# Patient Record
Sex: Female | Born: 1950
Health system: Southern US, Community
[De-identification: ages and names within clinical notes are randomized; demographics above are authoritative.]

## PROBLEM LIST (undated history)

## (undated) DIAGNOSIS — T7840XA Allergy, unspecified, initial encounter: Secondary | ICD-10-CM

## (undated) DIAGNOSIS — M1990 Unspecified osteoarthritis, unspecified site: Secondary | ICD-10-CM

## (undated) DIAGNOSIS — Z9889 Other specified postprocedural states: Secondary | ICD-10-CM

## (undated) DIAGNOSIS — K219 Gastro-esophageal reflux disease without esophagitis: Secondary | ICD-10-CM

## (undated) DIAGNOSIS — F419 Anxiety disorder, unspecified: Secondary | ICD-10-CM

## (undated) DIAGNOSIS — I10 Essential (primary) hypertension: Secondary | ICD-10-CM

## (undated) DIAGNOSIS — E785 Hyperlipidemia, unspecified: Secondary | ICD-10-CM

## (undated) DIAGNOSIS — Z136 Encounter for screening for cardiovascular disorders: Secondary | ICD-10-CM

## (undated) DIAGNOSIS — N951 Menopausal and female climacteric states: Secondary | ICD-10-CM

## (undated) DIAGNOSIS — G4700 Insomnia, unspecified: Secondary | ICD-10-CM

## (undated) DIAGNOSIS — F32A Depression, unspecified: Secondary | ICD-10-CM

## (undated) DIAGNOSIS — R112 Nausea with vomiting, unspecified: Secondary | ICD-10-CM

## (undated) DIAGNOSIS — F329 Major depressive disorder, single episode, unspecified: Secondary | ICD-10-CM

## (undated) HISTORY — DX: Essential (primary) hypertension: I10

## (undated) HISTORY — DX: Menopausal and female climacteric states: N95.1

## (undated) HISTORY — DX: Unspecified osteoarthritis, unspecified site: M19.90

## (undated) HISTORY — DX: Gastro-esophageal reflux disease without esophagitis: K21.9

## (undated) HISTORY — PX: DILATION AND CURETTAGE OF UTERUS: SHX78

## (undated) HISTORY — DX: Hyperlipidemia, unspecified: E78.5

## (undated) HISTORY — PX: FRACTURE SURGERY: SHX138

## (undated) HISTORY — DX: Allergy, unspecified, initial encounter: T78.40XA

## (undated) HISTORY — DX: Insomnia, unspecified: G47.00

## (undated) HISTORY — DX: Depression, unspecified: F32.A

## (undated) HISTORY — DX: Anxiety disorder, unspecified: F41.9

## (undated) HISTORY — DX: Encounter for screening for cardiovascular disorders: Z13.6

## (undated) HISTORY — DX: Other specified postprocedural states: Z98.890

## (undated) HISTORY — PX: TIBIA FRACTURE SURGERY: SHX806

## (undated) HISTORY — DX: Other specified postprocedural states: R11.2

## (undated) HISTORY — PX: BREAST BIOPSY: SHX20

## (undated) HISTORY — DX: Major depressive disorder, single episode, unspecified: F32.9

---

## 2002-05-07 ENCOUNTER — Other Ambulatory Visit: Admission: RE | Admit: 2002-05-07 | Discharge: 2002-05-07 | Payer: Self-pay | Admitting: Obstetrics and Gynecology

## 2003-05-05 ENCOUNTER — Encounter: Payer: Self-pay | Admitting: Internal Medicine

## 2003-05-05 ENCOUNTER — Encounter: Admission: RE | Admit: 2003-05-05 | Discharge: 2003-05-05 | Payer: Self-pay | Admitting: Internal Medicine

## 2003-05-19 ENCOUNTER — Other Ambulatory Visit: Admission: RE | Admit: 2003-05-19 | Discharge: 2003-05-19 | Payer: Self-pay | Admitting: Obstetrics and Gynecology

## 2004-05-31 ENCOUNTER — Other Ambulatory Visit: Admission: RE | Admit: 2004-05-31 | Discharge: 2004-05-31 | Payer: Self-pay | Admitting: Obstetrics and Gynecology

## 2004-11-02 ENCOUNTER — Ambulatory Visit: Payer: Self-pay | Admitting: Internal Medicine

## 2005-05-07 ENCOUNTER — Ambulatory Visit (HOSPITAL_COMMUNITY): Admission: RE | Admit: 2005-05-07 | Discharge: 2005-05-07 | Payer: Self-pay | Admitting: Obstetrics and Gynecology

## 2005-05-07 ENCOUNTER — Encounter (INDEPENDENT_AMBULATORY_CARE_PROVIDER_SITE_OTHER): Payer: Self-pay | Admitting: *Deleted

## 2005-06-06 ENCOUNTER — Other Ambulatory Visit: Admission: RE | Admit: 2005-06-06 | Discharge: 2005-06-06 | Payer: Self-pay | Admitting: Obstetrics and Gynecology

## 2005-11-29 ENCOUNTER — Ambulatory Visit: Payer: Self-pay | Admitting: Internal Medicine

## 2005-12-06 ENCOUNTER — Ambulatory Visit: Payer: Self-pay | Admitting: Internal Medicine

## 2006-08-05 ENCOUNTER — Other Ambulatory Visit: Admission: RE | Admit: 2006-08-05 | Discharge: 2006-08-05 | Payer: Self-pay | Admitting: Obstetrics and Gynecology

## 2007-09-17 ENCOUNTER — Other Ambulatory Visit: Admission: RE | Admit: 2007-09-17 | Discharge: 2007-09-17 | Payer: Self-pay | Admitting: Obstetrics and Gynecology

## 2007-11-19 ENCOUNTER — Ambulatory Visit: Payer: Self-pay | Admitting: Internal Medicine

## 2007-11-19 LAB — CONVERTED CEMR LAB
AST: 27 units/L (ref 0–37)
Albumin: 3.6 g/dL (ref 3.5–5.2)
Basophils Absolute: 0 10*3/uL (ref 0.0–0.1)
Bilirubin, Direct: 0.2 mg/dL (ref 0.0–0.3)
Chloride: 103 meq/L (ref 96–112)
Direct LDL: 154.2 mg/dL
Eosinophils Absolute: 0 10*3/uL (ref 0.0–0.6)
Eosinophils Relative: 0.8 % (ref 0.0–5.0)
GFR calc Af Amer: 95 mL/min
GFR calc non Af Amer: 78 mL/min
Glucose, Bld: 88 mg/dL (ref 70–99)
HCT: 36.1 % (ref 36.0–46.0)
Lymphocytes Relative: 32.6 % (ref 12.0–46.0)
MCHC: 33.9 g/dL (ref 30.0–36.0)
MCV: 92.5 fL (ref 78.0–100.0)
Monocytes Absolute: 0.3 10*3/uL (ref 0.2–0.7)
Neutro Abs: 2.9 10*3/uL (ref 1.4–7.7)
Neutrophils Relative %: 59.2 % (ref 43.0–77.0)
Potassium: 4.1 meq/L (ref 3.5–5.1)
RBC: 3.9 M/uL (ref 3.87–5.11)
Sodium: 140 meq/L (ref 135–145)
TSH: 1.59 microintl units/mL (ref 0.35–5.50)
Triglycerides: 165 mg/dL — ABNORMAL HIGH (ref 0–149)
WBC: 4.8 10*3/uL (ref 4.5–10.5)

## 2007-11-21 DIAGNOSIS — F339 Major depressive disorder, recurrent, unspecified: Secondary | ICD-10-CM | POA: Insufficient documentation

## 2007-11-21 DIAGNOSIS — F329 Major depressive disorder, single episode, unspecified: Secondary | ICD-10-CM

## 2007-11-21 DIAGNOSIS — F331 Major depressive disorder, recurrent, moderate: Secondary | ICD-10-CM | POA: Insufficient documentation

## 2007-12-01 ENCOUNTER — Ambulatory Visit: Payer: Self-pay | Admitting: Internal Medicine

## 2008-10-12 ENCOUNTER — Ambulatory Visit: Payer: Self-pay | Admitting: Internal Medicine

## 2008-10-19 ENCOUNTER — Other Ambulatory Visit: Admission: RE | Admit: 2008-10-19 | Discharge: 2008-10-19 | Payer: Self-pay | Admitting: Obstetrics & Gynecology

## 2008-10-27 ENCOUNTER — Encounter: Admission: RE | Admit: 2008-10-27 | Discharge: 2008-10-27 | Payer: Self-pay | Admitting: Sports Medicine

## 2008-11-26 ENCOUNTER — Telehealth: Payer: Self-pay | Admitting: *Deleted

## 2008-12-03 ENCOUNTER — Telehealth: Payer: Self-pay | Admitting: Internal Medicine

## 2008-12-13 ENCOUNTER — Ambulatory Visit: Payer: Self-pay | Admitting: Family Medicine

## 2008-12-13 ENCOUNTER — Telehealth: Payer: Self-pay | Admitting: Internal Medicine

## 2008-12-13 DIAGNOSIS — J309 Allergic rhinitis, unspecified: Secondary | ICD-10-CM | POA: Insufficient documentation

## 2009-05-18 ENCOUNTER — Encounter: Payer: Self-pay | Admitting: Internal Medicine

## 2009-09-26 ENCOUNTER — Ambulatory Visit: Payer: Self-pay | Admitting: Internal Medicine

## 2009-10-05 ENCOUNTER — Telehealth: Payer: Self-pay | Admitting: Internal Medicine

## 2009-11-09 ENCOUNTER — Ambulatory Visit: Payer: Self-pay | Admitting: Family Medicine

## 2009-11-14 LAB — CONVERTED CEMR LAB
ALT: 21 units/L (ref 0–35)
Albumin: 4.4 g/dL (ref 3.5–5.2)
Alkaline Phosphatase: 49 units/L (ref 39–117)
Basophils Relative: 1 % (ref 0–1)
CO2: 26 meq/L (ref 19–32)
Hemoglobin: 12.6 g/dL (ref 12.0–15.0)
LDL Cholesterol: 203 mg/dL — ABNORMAL HIGH (ref 0–99)
Lymphocytes Relative: 34 % (ref 12–46)
MCHC: 33.9 g/dL (ref 30.0–36.0)
Monocytes Absolute: 0.3 10*3/uL (ref 0.1–1.0)
Monocytes Relative: 6 % (ref 3–12)
Neutro Abs: 2.4 10*3/uL (ref 1.7–7.7)
Neutrophils Relative %: 58 % (ref 43–77)
Potassium: 4.2 meq/L (ref 3.5–5.3)
RBC: 4.14 M/uL (ref 3.87–5.11)
Sodium: 144 meq/L (ref 135–145)
TSH: 1.292 microintl units/mL (ref 0.350–4.500)
Total Bilirubin: 0.4 mg/dL (ref 0.3–1.2)
Total Protein: 7.2 g/dL (ref 6.0–8.3)
VLDL: 25 mg/dL (ref 0–40)
WBC: 4.1 10*3/uL (ref 4.0–10.5)

## 2009-12-15 ENCOUNTER — Other Ambulatory Visit: Admission: RE | Admit: 2009-12-15 | Discharge: 2009-12-15 | Payer: Self-pay | Admitting: Family Medicine

## 2009-12-15 ENCOUNTER — Ambulatory Visit: Payer: Self-pay | Admitting: Family Medicine

## 2009-12-15 DIAGNOSIS — Z780 Asymptomatic menopausal state: Secondary | ICD-10-CM | POA: Insufficient documentation

## 2009-12-15 DIAGNOSIS — E7800 Pure hypercholesterolemia, unspecified: Secondary | ICD-10-CM

## 2009-12-19 ENCOUNTER — Telehealth: Payer: Self-pay | Admitting: Family Medicine

## 2010-01-06 ENCOUNTER — Telehealth: Payer: Self-pay | Admitting: Family Medicine

## 2010-03-13 ENCOUNTER — Telehealth: Payer: Self-pay | Admitting: Family Medicine

## 2010-03-14 ENCOUNTER — Encounter: Payer: Self-pay | Admitting: Family Medicine

## 2010-03-14 LAB — CONVERTED CEMR LAB
ALT: 16 units/L (ref 0–35)
Cholesterol: 228 mg/dL — ABNORMAL HIGH (ref 0–200)
Triglycerides: 132 mg/dL (ref <150)

## 2010-03-16 ENCOUNTER — Ambulatory Visit: Payer: Self-pay | Admitting: Family Medicine

## 2010-04-10 ENCOUNTER — Telehealth: Payer: Self-pay | Admitting: Family Medicine

## 2010-05-02 ENCOUNTER — Encounter: Payer: Self-pay | Admitting: Family Medicine

## 2010-05-02 DIAGNOSIS — R928 Other abnormal and inconclusive findings on diagnostic imaging of breast: Secondary | ICD-10-CM | POA: Insufficient documentation

## 2010-05-03 ENCOUNTER — Other Ambulatory Visit: Admission: RE | Admit: 2010-05-03 | Discharge: 2010-05-03 | Payer: Self-pay | Admitting: Radiology

## 2010-05-03 ENCOUNTER — Encounter: Payer: Self-pay | Admitting: Family Medicine

## 2010-05-23 ENCOUNTER — Telehealth: Payer: Self-pay | Admitting: Family Medicine

## 2010-06-07 ENCOUNTER — Telehealth: Payer: Self-pay | Admitting: Family Medicine

## 2010-06-13 ENCOUNTER — Telehealth: Payer: Self-pay | Admitting: Family Medicine

## 2010-09-22 ENCOUNTER — Ambulatory Visit: Payer: Self-pay | Admitting: Family Medicine

## 2010-09-22 DIAGNOSIS — G43909 Migraine, unspecified, not intractable, without status migrainosus: Secondary | ICD-10-CM | POA: Insufficient documentation

## 2010-09-22 DIAGNOSIS — I10 Essential (primary) hypertension: Secondary | ICD-10-CM | POA: Insufficient documentation

## 2010-09-22 DIAGNOSIS — Z8669 Personal history of other diseases of the nervous system and sense organs: Secondary | ICD-10-CM | POA: Insufficient documentation

## 2010-09-22 DIAGNOSIS — H8110 Benign paroxysmal vertigo, unspecified ear: Secondary | ICD-10-CM | POA: Insufficient documentation

## 2010-10-30 ENCOUNTER — Encounter: Admission: RE | Admit: 2010-10-30 | Discharge: 2010-10-30 | Payer: Self-pay | Admitting: Family Medicine

## 2010-10-30 ENCOUNTER — Ambulatory Visit: Payer: Self-pay | Admitting: Family Medicine

## 2010-10-30 DIAGNOSIS — M542 Cervicalgia: Secondary | ICD-10-CM

## 2010-10-31 ENCOUNTER — Telehealth: Payer: Self-pay | Admitting: Family Medicine

## 2010-11-08 ENCOUNTER — Telehealth: Payer: Self-pay | Admitting: Family Medicine

## 2011-01-04 ENCOUNTER — Telehealth: Payer: Self-pay | Admitting: Family Medicine

## 2011-01-16 NOTE — Progress Notes (Signed)
Summary: Celebrex too expensive  Phone Note Call from Patient Call back at Home Phone [phone]   Caller: Patient Call For: Seymour Bars DO Summary of Call: Pt calls and went to pick up the Celebrex 200mg  given to her yesterday and for 30 tabs cost is 126.00- can not afford this.  Alternative med told by pharmacist is Meloxicam and can get a 90 day supply of it for 14.00. Wants to know if you would send this to pharmacy Initial call taken by: Kathlene November LPN,  October 31, 2010 8:41 AM    New/Updated Medications: MELOXICAM 7.5 MG TABS (MELOXICAM) 1-2 tabs by mouth once daily as needed for pain Prescriptions: MELOXICAM 7.5 MG TABS (MELOXICAM) 1-2 tabs by mouth once daily as needed for pain  #90 x 0   Entered and Authorized by:   Seymour Bars DO   Signed by:   Seymour Bars DO on 10/31/2010   Method used:   [facility] [phone][facility] (retail[facility]       [address] [place], [place]  [address]       Ph: [phone] or [phone]       Fax: [phone]   RxID:   [id number]

## 2011-01-16 NOTE — Progress Notes (Signed)
Summary: Ambien refill  Phone Note Refill Request   Refills Requested: Medication #1:  AMBIEN 10 MG  TABS 1 at bedtime as needed Pt would like Rx 90 day supply mailed to her so she can find best price.   Initial call taken by: Payton Spark CMA,  June 13, 2010 1:24 PM    Prescriptions: AMBIEN 10 MG  TABS (ZOLPIDEM TARTRATE) 1 at bedtime as needed  #90 x 0   Entered and Authorized by:   Seymour Bars DO   Signed by:   Seymour Bars DO on 06/13/2010   Method used:   Printed then faxed to ...       [facility] [phone][facility] (retail)       [address] [place], [place]  [address]       Ph: [phone] or [phone]       Fax: [phone]   RxID:   [id number]   Appended Document: Ambien refill Mailed to Pt
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
## 2011-01-16 NOTE — Progress Notes (Signed)
Summary: Sick  Phone Note Call from Patient Call back at Home Phone [phone]   Caller: Patient Call For: Deborah Bars DO Summary of Call: Pt husband calls and states pt woke up during the night body hot but no fever, H/A, sinus pressure, muscle aches. Seen this week. ? if needs to be seen or if something going around or if can call her in something. Please advise Initial call taken by: Kathlene November LPN,  November 08, 2010 8:33 AM  Follow-up for Phone Call        sounds like she is coming down with a viral infection.  I'd recommend rest, fluids, Vitamin C and Advil as needed.  If this does not help or she feels worse, go to UC. Follow-up by: Deborah Bars DO,  November 08, 2010 9:12 AM     Appended Document: Sick 11/08/2010 @ 9:15am- Pt notified of above instructions. KJ LPN

## 2011-01-16 NOTE — Progress Notes (Signed)
Summary: 90 day supply  Phone Note Call from Patient   Caller: Patient Summary of Call: Pt would like 90 day supply mailed to her. She changed insurance and would like to "shop around" to find best prices. [facility] AM. Initial call taken by: Payton Spark CMA,  June 07, 2010 3:36 PM    Prescriptions: PRAVASTATIN SODIUM 40 MG TABS (PRAVASTATIN SODIUM) 1 tab by mouth qhs  #90 x 0   Entered by:   Payton Spark CMA   Authorized by:   Seymour Bars DO   Signed by:   Seymour Bars DO on 06/07/2010   Method used:   Print then Mail to Patient   RxID:   [id number] CITALOPRAM HYDROBROMIDE 10 MG TABS (CITALOPRAM HYDROBROMIDE) 1 tab by mouth daily  #90 x 0   Entered by:   Payton Spark CMA   Authorized by:   Seymour Bars DO   Signed by:   Seymour Bars DO on 06/07/2010   Method used:   Print then Mail to Patient   RxID:   [id number] PROMETRIUM 100 MG  CAPS (PROGESTERONE MICRONIZED) 1 tab by mouth daily  #90 x 0   Entered by:   Payton Spark CMA   Authorized by:   Seymour Bars DO   Signed by:   Seymour Bars DO on 06/07/2010   Method used:   Print then Mail to Patient   RxID:   [id number] PREMARIN 0.45 MG TABS (ESTROGENS CONJUGATED) 1 tab by mouth daily  #90 x 0   Entered by:   Payton Spark CMA   Authorized by:   Seymour Bars DO   Signed by:   Seymour Bars DO on 06/07/2010   Method used:   Print then Mail to Patient   RxID:   [id number]

## 2011-01-16 NOTE — Progress Notes (Signed)
Summary: OTC med recommendation   Phone Note Call from Patient   Caller: Patient Summary of Call: Pt would like the name of the OTC vaginal cream you recommend. She also has head congestion and would like to know which OTC med you recommend for that and will not cause a problem w/ her current meds. Please advise.  Initial call taken by: Payton Spark CMA,  December 19, 2009 11:27 AM  Follow-up for Phone Call        She can use OTC Monistat 7 for yeast infection.  She can use OTC Advil Cold and sinus for head congestion.   Follow-up by: Seymour Bars DO,  December 19, 2009 11:37 AM     Appended Document: OTC med recommendation  Pt aware

## 2011-01-16 NOTE — Progress Notes (Signed)
Summary: Lab order  Phone Note Call from Patient   Caller: Patient Summary of Call: Pt has apt w/ you on Thurs for f/u cholesterol. Pt would like to do labs fasting tomorrow morning. If you enter labs, I will fax.  Initial call taken by: Payton Spark CMA,  March 13, 2010 10:52 AM  Follow-up for Phone Call        done. Follow-up by: Seymour Bars DO,  March 13, 2010 11:05 AM     Appended Document: Lab order faxed. LMOM informing pt

## 2011-01-16 NOTE — Assessment & Plan Note (Signed)
Summary: f/u cholesterol/ mood/ HRT   Vital Signs:  Patient profile:   60 year old female Menstrual status:  postmenopausal Height:      62 inches Weight:      142 pounds BMI:     26.07 O2 Sat:      98 % on Room air Temp:     98.0 degrees F oral Pulse rate:   79 / minute BP sitting:   139 / 79  (left arm) Cuff size:   regular  Vitals Entered By: Payton Spark CMA (March 16, 2010 9:00 AM)  O2 Flow:  Room air CC: F/U cholesterol   Primary Care Provider:  Seymour Bars DO  CC:  F/U cholesterol.  History of Present Illness: 60 yo WF presents for f/u high cholesterol and anxiety.  She is doing well on Pravastatin and her repeat labs look great.  She is tolerating the medications well.  She is upset about a weight gain of 134--> 142.  She admits to eating a lot of sugar and not exercising regularly.  She has been having some knee pain.  She saw her orthopedist and had a cortisone shot in the L knee which has much improved her DJD pain.  We changed her Lexapro to Citalopram for cost savings.  It is working just fine for her.   She has some distal numbness but no pain over the distal R great toe that seemed to start after wearing shoes too small.  She wants to come back to have an irritating mole removed under her R arm.    Current Medications (verified): 1)  Premarin 0.625 Mg  Tabs (Estrogens Conjugated) .Marland Kitchen.. 1 Once Daily 2)  Prometrium 100 Mg  Caps (Progesterone Micronized) .Marland Kitchen.. 1 Tab By Mouth Daily 3)  Ambien 10 Mg  Tabs (Zolpidem Tartrate) .Marland Kitchen.. 1 At Bedtime As Needed 4)  Citalopram Hydrobromide 10 Mg Tabs (Citalopram Hydrobromide) .Marland Kitchen.. 1 Tab By Mouth Daily 5)  Pravastatin Sodium 40 Mg Tabs (Pravastatin Sodium) .Marland Kitchen.. 1 Tab By Mouth Qhs  Allergies (verified): 1)  ! Codeine Phosphate (Codeine Phosphate)  Past History:  Past Medical History: Depression/ Anxiety Insomnia Migraine menopausal syndrome-- on HRT x 45yrs (starting wean) Allergic rhinitis  Past Surgical  History: Reviewed history from 11/21/2007 and no changes required. D&C  Family History: Reviewed history from 11/09/2009 and no changes required. 2 sisters, 1 brother healthy 1 brother died AIDS father died in 51s ETOHism, depression Mother died in her 48s throat cancer, ETOHism. GF DM  Social History: Reviewed history from 11/09/2009 and no changes required. Married to [place]. Has a grown daughter in [place] and a grown stepson in [place]. 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 Walks some for exercise.  Review of Systems      See HPI  Physical Exam  General:  alert, well-developed, well-nourished, and well-hydrated.   Head:  normocephalic and atraumatic.   Nose:  no nasal discharge.   Mouth:  good dentition and pharynx pink and moist.   Neck:  no masses.   Lungs:  Normal respiratory effort, chest expands symmetrically. Lungs are clear to auscultation, no crackles or wheezes. Heart:  Normal rate and regular rhythm. S1 and S2 normal without gallop, murmur, click, rub or other extra sounds. Msk:  synovitis with heberdens nodes on hands. Pulses:  2+ R pedal pulse Extremities:  no LE edema Neurologic:  pinpoint subjective numbness over the DIP joint R great toe.   Skin:  color normal.  0.5 cm  raised nevus under R arm Cervical Nodes:  No lymphadenopathy noted Psych:  good eye contact, not depressed appearing, and slightly anxious.     Impression & Recommendations:  Problem # 1:  HYPERCHOLESTEROLEMIA (ICD-272.0) Assessment Improved Continue current meds.  Copy of labs given to pt today. Her updated medication list for this problem includes:    Pravastatin Sodium 40 Mg Tabs (Pravastatin sodium) .Marland Kitchen... 1 tab by mouth qhs  Labs Reviewed: SGOT: 20 (03/14/2010)   SGPT: 16 (03/14/2010)   HDL:72 (03/14/2010), 49 (11/09/2009)  LDL:130 (03/14/2010), 203 (11/09/2009)  Chol:228 (03/14/2010), 277 (11/09/2009)  Trig:132 (03/14/2010), 124 (11/09/2009)  Problem #  2:  DEPRESSION (ICD-311) Assessment: Improved Continue Citalopram.   Her updated medication list for this problem includes:    Citalopram Hydrobromide 10 Mg Tabs (Citalopram hydrobromide) .Marland Kitchen... 1 tab by mouth daily  Problem # 3:  POSTMENOPAUSAL STATUS (ICD-V49.81) Will wean down on her next dose of Premarin in hopes of weaning off in the next 6-12 mos.  Problem # 4:  BENIGN NEOPLASM OF SKIN SITE UNSPECIFIED (ICD-216.9) She will return for a shave excision of mole in 2 wks.  Complete Medication List: 1)  Premarin 0.45 Mg Tabs (Estrogens conjugated) .Marland Kitchen.. 1 tab by mouth daily 2)  Prometrium 100 Mg Caps (Progesterone micronized) .Marland Kitchen.. 1 tab by mouth daily 3)  Ambien 10 Mg Tabs (Zolpidem tartrate) .Marland Kitchen.. 1 at bedtime as needed 4)  Citalopram Hydrobromide 10 Mg Tabs (Citalopram hydrobromide) .Marland Kitchen.. 1 tab by mouth daily 5)  Pravastatin Sodium 40 Mg Tabs (Pravastatin sodium) .Marland Kitchen.. 1 tab by mouth qhs

## 2011-01-16 NOTE — Progress Notes (Signed)
Summary: meds  Phone Note Call from Patient   Caller: Patient Summary of Call: Pt wanted to know if it will be OK to take melatonin in place of Ambien?  Pt also would like to know if she can start taking hormones and citalopram everyother day instead of everyday. Please advise. Initial call taken by: Payton Spark CMA,  May 23, 2010 8:43 AM  Follow-up for Phone Call        she can try Melatonin in place of ambien. If she wants to taper down on her hormones, set up a visit to discuss. She should not be taking her citalopram every other day.   Follow-up by: Seymour Bars DO,  May 23, 2010 8:57 AM     Appended Document: meds Pt aware

## 2011-01-16 NOTE — Progress Notes (Signed)
Summary: Lexapro denied  Phone Note Call from Patient   Caller: Patient Summary of Call: Pt's Rx plan denied Lexapro bc he must use a generic. Pt is open to changing to similar generic but not wellbutrin. Please advise.  Initial call taken by: Payton Spark CMA,  January 06, 2010 9:14 AM    New/Updated Medications: CITALOPRAM HYDROBROMIDE 10 MG TABS (CITALOPRAM HYDROBROMIDE) 1 tab by mouth daily Prescriptions: CITALOPRAM HYDROBROMIDE 10 MG TABS (CITALOPRAM HYDROBROMIDE) 1 tab by mouth daily  #30 x 1   Entered and Authorized by:   Seymour Bars DO   Signed by:   Seymour Bars DO on 01/06/2010   Method used:   Electronically to        [facility] [phone]* (retail)       [address] [place], [place]  [address]       Ph: [phone] or [phone]       Fax: [phone]   RxID:   [id number]   Appended Document: Lexapro denied Pt aware

## 2011-01-16 NOTE — Assessment & Plan Note (Signed)
Summary: vertigo/ BP   Vital Signs:  Patient profile:   60 year old female Menstrual status:  postmenopausal Height:      62 inches Weight:      152 pounds BMI:     27.90 O2 Sat:      98 % on Room air Pulse rate:   72 / minute Pulse (ortho):   74 / minute BP sitting:   165 / 85  (right arm) BP standing:   170 / 84 Cuff size:   regular  Vitals Entered By: Payton Spark CMA (September 22, 2010 9:46 AM)  O2 Flow:  Room air  Serial Vital Signs/Assessments:  Time      Position  BP       Pulse  Resp  Temp     By 9:48 AM   Lying RA  150/88   72                    Payton Spark CMA 9:48 AM   Sitting   165/88   72                    Payton Spark CMA 9:48 AM   Standing  170/84   74                    Payton Spark CMA  CC: Dizziness and nausea- 2 episodes in 1 week.   Primary Care Ashaki Frosch:  Seymour Bars DO  CC:  Dizziness and nausea- 2 episodes in 1 week.Marland Kitchen  History of Present Illness: 60 yo WF presents for a HA that started 7 days ago.  On Sat AM, she woke up and had rotational vertigo and N/V.  She took some Antivert which helped.  She felt OK on Sun but was groggy with a headache.  She felt OK on Mon, Tues and Wed but had some car sickness yesterday and feels off balance today w/o nausea.  She has a HA today.    She takes a claritin everyday.  Denies much congestion.  Distant hx of migraines.  She has had some epigastric pain the last few wks.  She woiuld like to come off her Citalopram since she has gained 10 lbs on it and thinks her anxiety is 'doing well'.    Allergies: 1)  ! Codeine Phosphate (Codeine Phosphate)  Past History:  Past Medical History: Reviewed history from 03/16/2010 and no changes required. Depression/ Anxiety Insomnia Migraine menopausal syndrome-- on HRT x 80yrs (starting wean) Allergic rhinitis  Family History: Reviewed history from 11/09/2009 and no changes required. 2 sisters, 1 brother healthy 1 brother died AIDS father died in 6s  ETOHism, depression Mother died in her 43s throat cancer, ETOHism. GF DM  Social History: Reviewed history from 11/09/2009 and no changes required. Married to [place]. Has a grown daughter in [place] and a grown stepson in [place]. 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, [place]. Never smoked. Occas ETOH. Walks some for exercise.  Review of Systems General:  Denies chills, fatigue, fever, loss of appetite, sleep disorder, sweats, weakness, and weight loss. Eyes:  Denies blurring and double vision. ENT:  Denies difficulty swallowing, postnasal drainage, ringing in ears, and sinus pressure. CV:  Complains of lightheadness; denies chest pain or discomfort, near fainting, and palpitations. GI:  Complains of nausea and vomiting; denies abdominal pain. GU:  Denies incontinence. Neuro:  Complains of headaches, poor balance, and sensation of room spinning; denies difficulty with concentration, falling down, inability to  speak, numbness, tremors, and visual disturbances.  Physical Exam  General:  alert, well-developed, well-nourished, and well-hydrated.  here with her husband Head:  normocephalic and atraumatic.   Eyes:  PERRLA, EOMI Ears:  EACs patent; TMs translucent and gray with good cone of light and bony landmarks.  Nose:  no nasal discharge and no airflow obstruction.   Mouth:  good dentition and pharynx pink and moist.   Neck:  supple and no masses.  tight trapezious muscles Lungs:  Normal respiratory effort, chest expands symmetrically. Lungs are clear to auscultation, no crackles or wheezes. Heart:  Normal rate and regular rhythm. S1 and S2 normal without gallop, murmur, click, rub or other extra sounds. Abdomen:  soft and non-tender.   Extremities:  no UE or LE edema Neurologic:  no resting tremor + Dix Hallpike Maneuver to the R side with 3 beats of horizontal nystagmus.   Skin:  color normal.   Cervical Nodes:  No lymphadenopathy noted Psych:  good eye contact, not anxious appearing, and  not depressed appearing.     Impression & Recommendations:  Problem # 1:  BENIGN PAROXYSMAL POSITIONAL VERTIGO (ICD-386.11) + dix hallpike on the R side which induced dizziness.  Phenergan 25 mg IM injection given today.  Advised taking 2 Aleve and sleeping.  This should also help with her neckpain and migraine.  RX for Meclizine given for recurrent vertigo if needed.    This may have been triggered by weight gain with higher BP readings and concurrent migraines. Call if not resolved in 1 wk. Her updated medication list for this problem includes:    Meclizine Hcl 25 Mg Chew (Meclizine hcl) .Marland Kitchen... 1 chewable tab by mouth three times a day as needed for dizziness  Problem # 2:  ESSENTIAL HYPERTENSION, BENIGN (ICD-401.1) BP high today with negative orthostatics.  Will treat both HTN and migraines with Propranolol LA 60 mg once daily. RTC to recheck in 6 wks. Her updated medication list for this problem includes:    Propranolol Hcl Cr 60 Mg Xr24h-cap (Propranolol hcl) .Marland Kitchen... 1 capsule by mouth daily  BP today: 165/85 Prior BP: 139/79 (03/16/2010)  Labs Reviewed: K+: 4.2 (11/09/2009) Creat: : 0.84 (11/09/2009)   Chol: 228 (03/14/2010)   HDL: 72 (03/14/2010)   LDL: 130 (03/14/2010)   TG: 132 (03/14/2010)  Problem # 3:  MIGRAINE HEADACHE (ICD-346.90) Hx of migraines.  Concurrent HA with vertigo.  Sometimes vertigo accompanies migraines.  She has not been taking anything.  Will treat this time with Aleve 2 tabs by mouth 2 x a day as needed and add Propranolol once daily for migraine prevention.  Not a triptan candidate for current BP readings. Her updated medication list for this problem includes:    Propranolol Hcl Cr 60 Mg Xr24h-cap (Propranolol hcl) .Marland Kitchen... 1 capsule by mouth daily  Problem # 4:  DEPRESSION (ICD-311) Due to wt gain, I stopped her Citalopram.  Her husband will call me if he notices any drastic changes in her mood.  O/W will see her back in 6 wks and will see if she has any wt  loss coming off of this and how her mood is. The following medications were removed from the medication list:    Citalopram Hydrobromide 10 Mg Tabs (Citalopram hydrobromide) .Marland Kitchen... 1 tab by mouth daily  Complete Medication List: 1)  Premarin 0.45 Mg Tabs (Estrogens conjugated) .Marland Kitchen.. 1 tab by mouth daily 2)  Prometrium 100 Mg Caps (Progesterone micronized) .Marland Kitchen.. 1 tab by mouth daily 3)  Ambien 10 Mg Tabs (Zolpidem tartrate) .Marland Kitchen.. 1 at bedtime as needed 4)  Pravastatin Sodium 40 Mg Tabs (Pravastatin sodium) .Marland Kitchen.. 1 tab by mouth qhs 5)  Propranolol Hcl Cr 60 Mg Xr24h-cap (Propranolol hcl) .Marland Kitchen.. 1 capsule by mouth daily 6)  Meclizine Hcl 25 Mg Chew (Meclizine hcl) .Marland Kitchen.. 1 chewable tab by mouth three times a day as needed for dizziness  Patient Instructions: 1)  Phenergan injection today will make you sleepy. 2)  Take 2 tabs of Aleve for current headache and try to sleep. 3)  If vertigo occurs again, use RX Meclizine chewable tabs as needed. 4)  Try to keep head still thru the weekend.  Avoid bending forward and sleep with your head elevated. 5)  Start on Propranolol once daily for both migraine prevention and high BP. 6)  Stop your Citalopram. 7)  Call if vertigo is not resolved in 5-7 days. 8)  REturn for f/u anxiety/ BP in 6 wks. Prescriptions: MECLIZINE HCL 25 MG CHEW (MECLIZINE HCL) 1 chewable tab by mouth three times a day as needed for dizziness  #20 x 0   Entered and Authorized by:   Seymour Bars DO   Signed by:   Seymour Bars DO on 09/22/2010   Method used:   [facility]        [facility] [phone]* (retail)       [address] [place], [place]  [address]       Ph: [phone] or [phone]       Fax: [phone]   RxID:   [id number] PROPRANOLOL HCL CR 60 MG XR24H-CAP (PROPRANOLOL HCL) 1 capsule by mouth daily  #30 x 2   Entered and Authorized by:   Seymour Bars DO   Signed by:   Seymour Bars DO on 09/22/2010   Method used:   [facility]ron[facility] to        [facility]  [phone]* (retail)       [address] [place], [place]  [address]       Ph: [phone] or [phone]       Fax: [phone]   RxID:   [id number]   Appended Document: vertigo/ BP   Medication Administration  Injection # 1:    Medication: Promethazine up to 50mg     Diagnosis: MIGRAINE HEADACHE (ICD-346.90)    Route: IM    Site: RUOQ gluteus    Exp Date: 11/2011    Lot #: [id number]    Comments: 25mg     Patient tolerated injection without complications    Given by: Payton Spark CMA (September 22, 2010 12:52 PM)  Orders Added: 1)  Promethazine up to 50mg  [J2550] 2)  Admin of Therapeutic Inj  intramuscular or subcutaneous [61607]

## 2011-01-16 NOTE — Progress Notes (Signed)
Summary: Bone scan ?  Phone Note Call from Patient   Caller: Patient Summary of Call: Pt called stating she called to schedule bone density scan and was told that she had one done 11/09 and not due until after 11/11. Pt wanted to know if you still wanted her to have this done or if she should just wait until 11/11. Please advise. Initial call taken by: Payton Spark CMA,  April 10, 2010 10:56 AM  Follow-up for Phone Call        ok to wait till Nov. Follow-up by: Seymour Bars DO,  April 10, 2010 11:56 AM     Appended Document: Bone scan ? pt aware

## 2011-01-16 NOTE — Miscellaneous (Signed)
Summary: mammo abnormal  Clinical Lists Changes  Problems: Added new problem of MAMMOGRAM, ABNORMAL (ICD-793.80) Orders: Added new Test order of T-Mammography, Diagnostic (bilateral) ([id number]) - Signed Added new Test order of T-US Breast W/image documentation [id number]) - Signed     Order printed for diagnostic mammogram bilat with u/s if needed just to make sure pt has f/u imaging.  Pls make sure pt is aware she needs to go back for more studies.  Seymour Bars, D.O.   Appended Document: mammo abnormal Pt is now aware

## 2011-01-18 NOTE — Progress Notes (Signed)
Summary: Rx question  Phone Note Call from Patient Call back at Work Phone [phone]   Reason for Call: Talk to Nurse Summary of Call: Pt would like to  switch to generic or cheaper version of Prometrium and Premarin. If so please print and I will fax to mail order company.  Initial call taken by: Payton Spark CMA,  January 04, 2011 11:49 AM    New/Updated Medications: ESTRADIOL 1 MG TABS (ESTRADIOL) 1 tab by mouth daily MEDROXYPROGESTERONE ACETATE 2.5 MG TABS (MEDROXYPROGESTERONE ACETATE) 1 tab by mouth daily Prescriptions: MEDROXYPROGESTERONE ACETATE 2.5 MG TABS (MEDROXYPROGESTERONE ACETATE) 1 tab by mouth daily  #90 x 1   Entered and Authorized by:   Seymour Bars DO   Signed by:   Seymour Bars DO on 01/04/2011   Method used:   Printed then faxed to ...       [facility]  [place] [phone]* (retail)       [address] [place], [place]  [address]       Ph: [phone] or [phone]       Fax: [phone]   RxID:   [id number] ESTRADIOL 1 MG TABS (ESTRADIOL) 1 tab by mouth daily  #90 x 1   Entered and Authorized by:   Seymour Bars DO   Signed by:   Seymour Bars DO on 01/04/2011   Method used:   Printed then faxed to ...       [facility]  [place] [phone]* (retail)       [address] [place], [place]  [address]       Ph: [phone] or [phone]       Fax: [phone]   RxID:   [id number]   Appended Document: Rx question LM w/ husband informing him that Rx are at the front desk ready for pick up

## 2011-01-31 ENCOUNTER — Ambulatory Visit: Payer: Self-pay | Admitting: Family Medicine

## 2011-02-14 ENCOUNTER — Encounter: Payer: Self-pay | Admitting: Family Medicine

## 2011-03-06 NOTE — Letter (Signed)
Summary: [facility]  [facility]   Imported By: Maryln Gottron 02/27/2011 09:38:45  _____________________________________________________________________  External Attachment:    Type:   Image     Comment:   External Document

## 2011-05-04 NOTE — Op Note (Signed)
NAMECHARISSE, Deborah Mccullough               ACCOUNT NO.:  [id number]   MEDICAL RECORD NO.:  [id number]          PATIENT TYPE:  AMB   LOCATION:  SDC                           FACILITY:  [facility]   PHYSICIAN:  Cynthia P. Romine, M.D.DATE OF BIRTH:  12/28/1950   DATE OF PROCEDURE:  05/07/2005  DATE OF DISCHARGE:                                 OPERATIVE REPORT   PREOPERATIVE DIAGNOSIS:  Endometrial simple hyperplasia and known  endometrial polyp with postmenopausal bleeding.   POSTOPERATIVE DIAGNOSIS:  Endometrial simple hyperplasia and known  endometrial polyp with postmenopausal bleeding, pathology pending.   PROCEDURE:  Hysteroscopy with resection of endometrial polyp and dilatation  and curettage.   SURGEON:  Cynthia P. Romine, M.D.   ANESTHESIA:  General by MAC.   ESTIMATED BLOOD LOSS:  Minimal.   SORBITOL DEFICIT:  65 mL.   COMPLICATIONS:  None.   PROCEDURE:  The patient was taken to the operating room and after induction  of adequate general anesthesia was placed in the dorsal lithotomy position  and prepped and draped in the usual fashion.  The cervix sounded to 8 cm.  The uterus was dilated to a #31 [employer].  The operative hysteroscope was  introduced, sorbitol was used as a distention medium and the pump was set on  70 mmHg pressure.  Photographic documentation was taken of the fundally-  located polyp.  This was then excised in several passes using a single loop.  Following complete removal of the polyp, photographic documentation was  taken of the clean cavity.  The hysteroscope was removed.  Sharp curettage  was carried out and the specimen sent to pathology, and the procedure was  terminated.  The patient tolerated it well and went to postanesthesia  recovery in satisfactory condition.      CPR/MEDQ  D:  05/07/2005  T:  05/07/2005  Job:  [id number]

## 2011-05-17 ENCOUNTER — Other Ambulatory Visit: Payer: Self-pay | Admitting: Family Medicine

## 2011-05-17 DIAGNOSIS — Z1231 Encounter for screening mammogram for malignant neoplasm of breast: Secondary | ICD-10-CM

## 2011-06-05 ENCOUNTER — Ambulatory Visit
Admission: RE | Admit: 2011-06-05 | Discharge: 2011-06-05 | Disposition: A | Discharge status: Home / Self Care | Payer: Self-pay | Source: Ambulatory Visit | Attending: Family Medicine | Admitting: Family Medicine

## 2011-06-05 DIAGNOSIS — Z1231 Encounter for screening mammogram for malignant neoplasm of breast: Secondary | ICD-10-CM

## 2011-06-15 ENCOUNTER — Ambulatory Visit
Admission: RE | Admit: 2011-06-15 | Discharge: 2011-06-15 | Disposition: A | Discharge status: Home / Self Care | Payer: BC Managed Care – PPO | Source: Ambulatory Visit | Attending: Family Medicine | Admitting: Family Medicine

## 2011-06-15 ENCOUNTER — Telehealth: Payer: Self-pay | Admitting: Family Medicine

## 2011-06-15 ENCOUNTER — Other Ambulatory Visit: Payer: Self-pay | Admitting: Family Medicine

## 2011-06-15 DIAGNOSIS — R928 Other abnormal and inconclusive findings on diagnostic imaging of breast: Secondary | ICD-10-CM

## 2011-06-15 NOTE — Telephone Encounter (Signed)
Pls let pt know that her diagnostic mammo showed a simple cyst on the R and she can go back to annual screening mammograms.

## 2011-06-15 NOTE — Telephone Encounter (Signed)
Pls let pt know that her mammogram came back abnormal and she should be contacted by the breast center about having a R sided diagnostic study.

## 2011-06-18 NOTE — Telephone Encounter (Signed)
Pt is aware of the above.

## 2011-06-27 ENCOUNTER — Other Ambulatory Visit: Payer: Self-pay | Admitting: Family Medicine

## 2011-07-11 ENCOUNTER — Other Ambulatory Visit: Payer: Self-pay | Admitting: Family Medicine

## 2011-09-06 ENCOUNTER — Other Ambulatory Visit: Payer: Self-pay | Admitting: Family Medicine

## 2011-09-06 ENCOUNTER — Telehealth: Payer: Self-pay | Admitting: Family Medicine

## 2011-09-06 MED ORDER — PRAVASTATIN SODIUM 40 MG PO TABS: 40.0000 mg | ORAL_TABLET | Freq: Every day | ORAL | Status: DC

## 2011-09-06 NOTE — Telephone Encounter (Signed)
Pt called and said [facility] will be sending a refill request for her cholesterol medication. Plan:  After reviewing pt chart pt needs office visit and fasting chol labs before anymore med will be sent after this refill.  Has been over 1 yr. 30 day supply was sent electronically to [facility] [place].  Appt scheduled. Jarvis Newcomer, LPN Domingo Dimes

## 2011-09-06 NOTE — Telephone Encounter (Signed)
Closed

## 2011-09-07 ENCOUNTER — Other Ambulatory Visit: Payer: Self-pay | Admitting: *Deleted

## 2011-09-07 MED ORDER — PRAVASTATIN SODIUM 40 MG PO TABS: 40.0000 mg | ORAL_TABLET | Freq: Every day | ORAL | Status: DC

## 2011-09-11 ENCOUNTER — Encounter: Payer: Self-pay | Admitting: Family Medicine

## 2011-09-11 ENCOUNTER — Other Ambulatory Visit: Payer: Self-pay | Admitting: Family Medicine

## 2011-09-11 NOTE — Telephone Encounter (Signed)
Requested refill for pravastatin 40 mg. Plan:  #90/0 refills was sent on 09-07-11. Jarvis Newcomer, LPN Domingo Dimes

## 2011-09-13 ENCOUNTER — Ambulatory Visit: Payer: BC Managed Care – PPO | Admitting: Family Medicine

## 2011-09-20 ENCOUNTER — Other Ambulatory Visit (HOSPITAL_COMMUNITY)
Admission: RE | Admit: 2011-09-20 | Discharge: 2011-09-20 | Disposition: A | Discharge status: Home / Self Care | Payer: BC Managed Care – PPO | Source: Ambulatory Visit | Attending: Family Medicine | Admitting: Family Medicine

## 2011-09-20 ENCOUNTER — Encounter: Payer: Self-pay | Admitting: Family Medicine

## 2011-09-20 ENCOUNTER — Ambulatory Visit (INDEPENDENT_AMBULATORY_CARE_PROVIDER_SITE_OTHER): Payer: BC Managed Care – PPO | Admitting: Family Medicine

## 2011-09-20 VITALS — BP 138/85 | HR 84 | Wt 142.0 lb

## 2011-09-20 DIAGNOSIS — R5381 Other malaise: Secondary | ICD-10-CM

## 2011-09-20 DIAGNOSIS — R5383 Other fatigue: Secondary | ICD-10-CM

## 2011-09-20 DIAGNOSIS — Z01419 Encounter for gynecological examination (general) (routine) without abnormal findings: Secondary | ICD-10-CM | POA: Insufficient documentation

## 2011-09-20 DIAGNOSIS — Z1159 Encounter for screening for other viral diseases: Secondary | ICD-10-CM | POA: Insufficient documentation

## 2011-09-20 DIAGNOSIS — Z23 Encounter for immunization: Secondary | ICD-10-CM

## 2011-09-20 DIAGNOSIS — Z2911 Encounter for prophylactic immunotherapy for respiratory syncytial virus (RSV): Secondary | ICD-10-CM

## 2011-09-20 DIAGNOSIS — M545 Low back pain: Secondary | ICD-10-CM

## 2011-09-20 MED ORDER — PRAVASTATIN SODIUM 40 MG PO TABS: 40.0000 mg | ORAL_TABLET | Freq: Every day | ORAL | Status: DC

## 2011-09-20 MED ORDER — ESTRADIOL 1 MG PO TABS: 1.0000 mg | ORAL_TABLET | Freq: Every day | ORAL | Status: DC

## 2011-09-20 MED ORDER — ZOLPIDEM TARTRATE 10 MG PO TABS: 10.0000 mg | ORAL_TABLET | Freq: Every evening | ORAL | Status: DC | PRN

## 2011-09-20 MED ORDER — PROPRANOLOL HCL ER 60 MG PO CP24: 60.0000 mg | ORAL_CAPSULE | Freq: Every day | ORAL | Status: DC

## 2011-09-20 MED ORDER — MELOXICAM 7.5 MG PO TABS: 7.5000 mg | ORAL_TABLET | Freq: Two times a day (BID) | ORAL | Status: DC | PRN

## 2011-09-20 MED ORDER — MEDROXYPROGESTERONE ACETATE 2.5 MG PO TABS: 2.5000 mg | ORAL_TABLET | Freq: Every day | ORAL | Status: DC

## 2011-09-20 NOTE — Progress Notes (Signed)
Subjective:     Deborah Mccullough is a 60 y.o. female and is here for a comprehensive physical exam. The patient reports problems - Low back pain for several weeks. Prior hx of pelvic fracture.  Has been more fatigued lately. .No radiation of pain.  Last CPE ws 2 yr ago.  No radiation into her legs. Says no worsening or alleviating sxs. Has been using heat and ice prn. Helping some. Across right and left Low back pain.   She also c/o of fatigue.d Says fatigues early with exercise. No CP or SOB.    History   Social History  . Marital Status: Married    Spouse Name: N/A    Number of Children: 1  . Years of Education: N/A   Occupational History  . [employer] insurance    Social History Main Topics  . Smoking status: Former Games developer  . Smokeless tobacco: Not on file  . Alcohol Use: 0.0 oz/week    0 drink(s) per week  . Drug Use: No  . Sexually Active: Not on file   Other Topics Concern  . Not on file   Social History Narrative   2 cups caffeine daily. Tries to walk for exercise, about 3 per week.    Health Maintenance  Topic Date Due  . Pap Smear  11/09/1967  . Colonoscopy  11/09/1999  . Zostavax  11/08/2009  . Influenza Vaccine  09/17/2011  . Mammogram  06/14/2013  . Tetanus/tdap  12/16/2019    The following portions of the patient's history were reviewed and updated as appropriate: allergies, current medications, past family history, past medical history, past social history and past surgical history.  Review of Systems A comprehensive review of systems was negative.   Objective:    BP 138/85  Pulse 84  Wt 142 lb (64.411 kg) General appearance: alert, cooperative and appears stated age Head: Normocephalic, without obvious abnormality, atraumatic Eyes: conjunctiva clear, PEERLA, EOMI Ears: normal TM's and external ear canals both ears Nose: Nares normal. Septum midline. Mucosa normal. No drainage or sinus tenderness. Throat: lips, mucosa, and tongue normal; teeth and gums  normal Neck: no adenopathy, no carotid bruit, supple, symmetrical, trachea midline and thyroid not enlarged, symmetric, no tenderness/mass/nodules Back: symmetric, no curvature. ROM normal. No CVA tenderness. Lungs: clear to auscultation bilaterally Breasts: normal appearance, no masses or tenderness Heart: regular rate and rhythm, S1, S2 normal, no murmur, click, rub or gallop Abdomen: soft, non-tender; bowel sounds normal; no masses,  no organomegaly Pelvic: cervix normal in appearance, external genitalia normal, no adnexal masses or tenderness, no cervical motion tenderness, rectovaginal septum normal, uterus normal size, shape, and consistency and vagina normal without discharge Extremities: extremities normal, atraumatic, no cyanosis or edema Pulses: 2+ and symmetric Skin: Skin color, texture, turgor normal. No rashes or lesions Lymph nodes: Cervical, supraclavicular, and axillary nodes normal. Neurologic: Grossly normal    Back with NROM. Neg straight leg raise Bilat. Patellar reflex 1+ bilat.  Tender over the RT SI joint. Nontender over the left. nontender over the spine.   Assessment:    Healthy female exam.   Plan:     See After Visit Summary for Counseling Recommendations  Start a regular exercise program and make sure you are eating a healthy diet Try to eat 4 servings of dairy a day or take a calcium supplement (500mg  twice a day). Your vaccines are up to date.  Lab slip given  Cerumen impaction on the left. Able to still visualize her TM. Tried  to irrigate the ear but pt had dizziness and sensitivity  Acute low Back Pain - prior fracture yeast ago but pain over the last few weeks. Discussed NSAID, heat and ice and PT. Pt wanted to work on exercise and stretches at home first before formal PT. If not improving over the next 3 weeks then call and consider lumbar spine xray and formal PT.   Toe numbness  On the right foot.  Her 4th digit is actually curling under the next  toe and likely explains the sensitivity and swelling. No sign of erythema or infection. Offered to refer to Podiatry for further referral and eval but pt declined at this time.   Fatigue- _ will check CBC and TSH to rule out anemia and thyroid d/o.

## 2011-09-20 NOTE — Patient Instructions (Addendum)
Start a regular exercise program and make sure you are eating a healthy diet Try to eat 4 servings of dairy a day or take a calcium supplement (500mg  twice a day). Your vaccines are up to date.  Try the exercises for your back. Sleep with a pillow between your knees. Follow up if not getting better in the next couple of weeks.

## 2011-09-21 ENCOUNTER — Other Ambulatory Visit: Payer: Self-pay | Admitting: Family Medicine

## 2011-09-21 DIAGNOSIS — R0789 Other chest pain: Secondary | ICD-10-CM

## 2011-09-21 DIAGNOSIS — R0602 Shortness of breath: Secondary | ICD-10-CM

## 2011-09-21 LAB — CBC
Hemoglobin: 13.3 g/dL (ref 12.0–15.0)
MCH: 30.9 pg (ref 26.0–34.0)
RBC: 4.31 MIL/uL (ref 3.87–5.11)

## 2011-09-21 LAB — LIPID PANEL
Cholesterol: 212 mg/dL — ABNORMAL HIGH (ref 0–200)
VLDL: 34 mg/dL (ref 0–40)

## 2011-09-21 LAB — VITAMIN D 25 HYDROXY (VIT D DEFICIENCY, FRACTURES): Vit D, 25-Hydroxy: 52 ng/mL (ref 30–89)

## 2011-09-21 LAB — VITAMIN B12: Vitamin B-12: 366 pg/mL (ref 211–911)

## 2011-09-25 ENCOUNTER — Encounter: Payer: Self-pay | Admitting: Family Medicine

## 2011-09-25 ENCOUNTER — Telehealth: Payer: Self-pay | Admitting: Family Medicine

## 2011-09-25 NOTE — Telephone Encounter (Signed)
Pt's husband called and said someone from our office just called and left mess on their machine. Plan:  Call transferred to Kathlene November who had called the pt to get a stress test set up. Jarvis Newcomer, LPN Domingo Dimes

## 2011-09-25 NOTE — Telephone Encounter (Signed)
Tried to husband back- no answer but made a note under referral tab to have scheduled at [facility]

## 2011-09-25 NOTE — Telephone Encounter (Signed)
Pt's husband called back again and said they did speak with Kathlene November, LPN earlier and calling back to let Selena Batten know that he had spoken with his wife and she prefers to go to Dr. Daleen Squibb at [facility] for the exercise treadmill test.  Please call the pt back if this can be arranged. Plan:  Routed encounter to Kathlene November, LPN. Jarvis Newcomer, LPN Domingo Dimes

## 2011-09-28 ENCOUNTER — Other Ambulatory Visit: Payer: Self-pay | Admitting: *Deleted

## 2011-09-28 MED ORDER — PROPRANOLOL HCL ER 60 MG PO CP24: 60.0000 mg | ORAL_CAPSULE | Freq: Every day | ORAL | Status: DC

## 2011-09-28 MED ORDER — ZOLPIDEM TARTRATE 10 MG PO TABS: 10.0000 mg | ORAL_TABLET | Freq: Every evening | ORAL | Status: DC | PRN

## 2011-10-08 ENCOUNTER — Ambulatory Visit (INDEPENDENT_AMBULATORY_CARE_PROVIDER_SITE_OTHER): Payer: BC Managed Care – PPO | Admitting: Physician Assistant

## 2011-10-08 DIAGNOSIS — Z136 Encounter for screening for cardiovascular disorders: Secondary | ICD-10-CM

## 2011-10-08 DIAGNOSIS — R5381 Other malaise: Secondary | ICD-10-CM

## 2011-10-08 DIAGNOSIS — R5383 Other fatigue: Secondary | ICD-10-CM

## 2011-10-08 HISTORY — DX: Encounter for screening for cardiovascular disorders: Z13.6

## 2011-10-08 NOTE — Progress Notes (Signed)
Deborah Mccullough is a 60 y.o. female referred by PCP for routine ETT. She has a h/o HTN, HLP.  No h/o DM2 or CKD.  Notes remote tobacco abuse as a teenager.  No FHx of CAD.  She started exercising recently and has noted dyspnea.  No chest pain.  No syncope.  PHYSICAL EXAM: Well nourished, well developed, in no acute distress Neck: no JVD at 90 degrees Cardiac:  normal S1, S2; RRR; no murmur Lungs:  clear to auscultation bilaterally, no wheezing, rhonchi or rales Abd: soft, nontender Ext: no edema Skin: warm and dry Neuro:  CNs 2-12 intact, no focal abnormalities noted    Exercise Treadmill Test  Pre-Exercise Testing Evaluation Rhythm: normal sinus  Rate: 66   PR:  .15 QRS:  .07  QT:  .40 QTc: .42     Test  Exercise Tolerance Test Ordering MD: Nani Gasser M.D  Interpreting MD:  Tereso Newcomer PA-C  Unique Test No: 1  Treadmill:  1  Indication for ETT: fatigue  Contraindication to ETT: No   Stress Modality: exercise - treadmill  Cardiac Imaging Performed: non   Protocol: standard Bruce - maximal  Max BP:  181/80  Max MPHR (bpm):  159 85% MPR (bpm):  135  MPHR obtained (bpm):  136 % MPHR obtained:  85  Reached 85% MPHR (min:sec):  7:00 Total Exercise Time (min-sec):  7:20  Workload in METS:  10.1 Borg Scale: 17  Reason ETT Terminated:  patient's desire to stop    ST Segment Analysis At Rest: normal ST segments - no evidence of significant ST depression With Exercise: non-specific ST changes  Other Information Arrhythmia:  No Angina during ETT:  absent (0) Quality of ETT:  diagnostic  ETT Interpretation:  normal - no evidence of ischemia by ST analysis  Comments: Good exercise tolerance. No chest pain.  Patient did complain of dyspnea at end of test. Normal BP response to exercise. No ST-T changes to suggest ischemia.   Recommendations: Follow up as directed with PCP. We discussed how to increase activity for exercise.

## 2011-10-10 ENCOUNTER — Encounter: Payer: Self-pay | Admitting: Family Medicine

## 2011-10-10 ENCOUNTER — Telehealth: Payer: Self-pay | Admitting: Family Medicine

## 2011-10-10 NOTE — Telephone Encounter (Signed)
Pt notified. Pt is not having chest pain or tightness.

## 2011-10-10 NOTE — Telephone Encounter (Signed)
Please call patient and her noted her exercise treadmill test was normal. There was no evidence of ischemia or poor blood flow to areas of her heart. She actually had good exercise tolerance and did not drink any chest pain during the test. If she is experiencing any problems or chest pain please let us know.

## 2011-10-15 NOTE — Telephone Encounter (Signed)
Acknowledged.

## 2011-11-14 ENCOUNTER — Encounter: Payer: Self-pay | Admitting: Family Medicine

## 2011-11-14 ENCOUNTER — Ambulatory Visit (INDEPENDENT_AMBULATORY_CARE_PROVIDER_SITE_OTHER): Payer: BC Managed Care – PPO | Admitting: Family Medicine

## 2011-11-14 VITALS — BP 159/90 | HR 82 | Temp 98.4°F | Ht 63.0 in | Wt 138.0 lb

## 2011-11-14 DIAGNOSIS — H6690 Otitis media, unspecified, unspecified ear: Secondary | ICD-10-CM

## 2011-11-14 DIAGNOSIS — H9202 Otalgia, left ear: Secondary | ICD-10-CM

## 2011-11-14 DIAGNOSIS — H6692 Otitis media, unspecified, left ear: Secondary | ICD-10-CM

## 2011-11-14 DIAGNOSIS — H9209 Otalgia, unspecified ear: Secondary | ICD-10-CM

## 2011-11-14 MED ORDER — HYDROCODONE-ACETAMINOPHEN 5-325MG PREPACK (~~LOC~~: 20.0000 | ORAL_TABLET | Freq: Two times a day (BID) | ORAL | Status: DC

## 2011-11-14 MED ORDER — AMOXICILLIN-POT CLAVULANATE 875-125 MG PO TABS: 1.0000 | ORAL_TABLET | Freq: Two times a day (BID) | ORAL | Status: AC

## 2011-11-14 MED ORDER — FEXOFENADINE-PSEUDOEPHED ER 180-240 MG PO TB24: 1.0000 | ORAL_TABLET | Freq: Every day | ORAL | Status: AC

## 2011-11-14 MED ORDER — ANTIPYRINE-BENZOCAINE 5.4-1.4 % OT SOLN: 3.0000 [drp] | OTIC | Status: AC | PRN

## 2011-11-14 NOTE — Patient Instructions (Signed)
Take augmentin x 10 days Allegra q day  auralgen as needed for pain if rupture occurs please return in 3 weeks  Work note until 12/01/2012Otitis Media with Effusion Otitis media with effusion is the presence of fluid in the middle ear. This is a common problem that often follows ear infections. It may be present for weeks or longer after the infection. Unlike an acute ear infection, otits media with effusion refers only to fluid behind the ear drum and not infection. Children with repeated ear and sinus infections and allergy problems are the most likely to get otitis media with effusion. CAUSES  The most frequent cause of the fluid buildup is dysfunction of the eustacian tubes. These are the tubes that drain fluid in the ears to the throat. SYMPTOMS   The main symptom of this condition is hearing loss. As a result, you or your child may:   Listen to the TV at a loud volume.   Not respond to questions.   Ask "what" often when spoken to.   There may be a sensation of fullness or pressure but usually not pain.  DIAGNOSIS   Your caregiver will diagnose this condition by examining you or your child's ears.   Your caregiver may test the pressure in you or your child's ear with a tympanometer.   A hearing test may be conducted if the problem persists.   A caregiver will want to re-evaluate the condition periodically to see if it improves.  TREATMENT   Treatment depends on the duration and the effects of the effusion.   Antibiotics, decongestants, nose drops, and cortisone-type drugs may not be helpful.   Children with persistent ear effusions may have delayed language. Children at risk for developmental delays in hearing, learning, and speech may require referral to a specialist earlier than children not at risk.   You or your child's caregiver may suggest a referral to an Ear, Nose, and Throat (ENT) surgeon for treatment. The following may help restore normal hearing:   Drainage of  fluid.   Placement of ear tubes (tympanostomy tubes).   Removal of adenoids (adenoidectomy).  HOME CARE INSTRUCTIONS   Avoid second hand smoke.   Infants who are breast fed are less likely to have this condition.   Avoid feeding infants while laying flat.   Avoid known environmental allergens.   Be sure to see a caregiver or an ENT specialist for follow up.   Avoid people who are sick.  SEEK MEDICAL CARE IF:   Hearing is not better in 3 months.   Hearing is worse.   Ear pain.   Drainage from the ear.   Dizziness.  Document Released: 01/10/2005 Document Revised: 08/15/2011 Document Reviewed: 04/25/2010 [facility] Patient Information 2012 [place], [place].

## 2011-11-14 NOTE — Progress Notes (Signed)
  Subjective:    Patient ID: Deborah Mccullough, female    DOB: [birth year], 60 y.o.   MRN: [id number]  Otalgia  There is pain in the left ear. This is a new problem. The current episode started in the past 7 days. The problem occurs constantly. The problem has been gradually worsening. Associated symptoms include headaches and hearing loss. Pertinent negatives include no ear discharge. Associated symptoms comments: nausea. She has tried ear drops for the symptoms. The treatment provided no relief.      Review of Systems  HENT: Positive for hearing loss and ear pain. Negative for ear discharge.   Neurological: Positive for headaches.   BP 159/90  Pulse 82  Temp(Src) 98.4 F (36.9 C) (Oral)  Ht 5\' 3"  (1.6 m)  Wt 138 lb (62.596 kg)  BMI 24.45 kg/m2  SpO2 97%     Objective:   Physical Exam  Constitutional: She appears well-developed and well-nourished.  HENT:  Right Ear: External ear normal.  Left Ear: No drainage. Tympanic membrane is erythematous, retracted and bulging. A middle ear effusion is present.          Assessment & Plan:  Ear pain otitis media will place on Augmentin 875 one tablet twice a day Auralgen ear drops 2 drops in affected ear every 2 hours Allegra-D one tablet daily and we'll also give a prescription for Vicodin or hydrocodone 05/325 one tablet every 8 hours when necessary for pain. Explained to her husband that if they start noticing drainage from the ear this could be the ear rupturing and she  should come back in about 3 weeks for ear recheck . If she doesn't have any drainage coming from the ear then finish the antibiotic and return when necessary. Hypertension felt her blood pressure because of pain she is in remission to them that her blood pressures elevated and would not make any changes since there says she is having a significant amount of discomfort at this time.Marland Kitchen

## 2011-11-19 ENCOUNTER — Other Ambulatory Visit: Payer: Self-pay | Admitting: Dermatology

## 2011-12-04 ENCOUNTER — Telehealth: Payer: Self-pay | Admitting: *Deleted

## 2011-12-04 NOTE — Telephone Encounter (Signed)
Pt calls and states was seen end of November and put on antibiotic and Allegra for ear infection. Took all antibiotic and started to feel some better but now sx's of ringing in the ear, off balance and H/A are back. Still taking the Allegra as prescribed. Wanted to know if could get more antibiotic?

## 2011-12-04 NOTE — Telephone Encounter (Signed)
Needs appt. See if Dr. Thurmond Butts has appt on Thurs for her.

## 2011-12-06 ENCOUNTER — Ambulatory Visit (INDEPENDENT_AMBULATORY_CARE_PROVIDER_SITE_OTHER): Payer: BC Managed Care – PPO | Admitting: Family Medicine

## 2011-12-06 ENCOUNTER — Encounter: Payer: Self-pay | Admitting: Family Medicine

## 2011-12-06 DIAGNOSIS — H6690 Otitis media, unspecified, unspecified ear: Secondary | ICD-10-CM

## 2011-12-06 MED ORDER — CEFDINIR 300 MG PO CAPS: 300.0000 mg | ORAL_CAPSULE | Freq: Two times a day (BID) | ORAL | Status: AC

## 2011-12-06 NOTE — Progress Notes (Signed)
  Subjective:    Patient ID: Deborah Mccullough, female    DOB: Jan 03, 1951, 60 y.o.   MRN: [id number]  HPI Completed the augmenting for LOM and still taking her Allegra and says it felt better  Initially but says her hearing is still not back to normal.  Feels stopped up and the pain is better.  Felt a little dizzy for a couple of days. That has resolved. No pain or fever or drianage.    Review of Systems     Objective:   Physical Exam  Constitutional: She is oriented to person, place, and time. She appears well-developed and well-nourished.  HENT:  Head: Normocephalic and atraumatic.  Right Ear: External ear normal.  Left Ear: External ear normal.  Nose: Nose normal.  Mouth/Throat: Oropharynx is clear and moist.       TMs and canals are clear. Right TM and clear. Left TM is retracted and dull but no longer red and no fluid.   Eyes: Conjunctivae and EOM are normal. Pupils are equal, round, and reactive to light.  Neck: Neck supple. No thyromegaly present.  Cardiovascular: Normal rate, regular rhythm and normal heart sounds.   Pulmonary/Chest: Effort normal and breath sounds normal. She has no wheezes.  Lymphadenopathy:    She has no cervical adenopathy.  Neurological: She is alert and oriented to person, place, and time.  Skin: Skin is warm and dry.  Psychiatric: She has a normal mood and affect.          Assessment & Plan:  LOM - Resolving OM. Continue Allegra-D, will add a nasal steroid spray since TM is still retracted.  Call if not better in one week. If pain suddenly starts or starts draining or runs fever then can start ABX. Rx printed since we will be closed for the Christmas Holiday.

## 2011-12-06 NOTE — Patient Instructions (Signed)
2 squirts in each nostril once a day.  Call if not better in one week.  Continue your Allegra-D once a day.

## 2011-12-27 ENCOUNTER — Telehealth: Payer: Self-pay | Admitting: *Deleted

## 2011-12-27 NOTE — Telephone Encounter (Signed)
Lm for pt to call back

## 2011-12-27 NOTE — Telephone Encounter (Signed)
Go ahead and fill nad if not better in one week then needs appt.

## 2011-12-27 NOTE — Telephone Encounter (Signed)
Pt.notified

## 2011-12-27 NOTE — Telephone Encounter (Signed)
Pt states that since she was seen in Dec. That her ears are feeling better but that her glands in her throat are sore. Pt informed to fill the [facility] prescription that was given at her visit that she has not filled. Would like to know if you still suggest she get it filled for the soreness in her glands. Please advise.

## 2012-01-11 ENCOUNTER — Other Ambulatory Visit: Payer: Self-pay | Admitting: Family Medicine

## 2012-03-27 ENCOUNTER — Other Ambulatory Visit: Payer: Self-pay | Admitting: Family Medicine

## 2012-04-02 ENCOUNTER — Other Ambulatory Visit: Payer: Self-pay | Admitting: *Deleted

## 2012-04-02 MED ORDER — ZOLPIDEM TARTRATE 10 MG PO TABS: 10.0000 mg | ORAL_TABLET | Freq: Every evening | ORAL | Status: DC | PRN

## 2012-05-26 ENCOUNTER — Other Ambulatory Visit: Payer: Self-pay | Admitting: Family Medicine

## 2012-05-26 DIAGNOSIS — Z1231 Encounter for screening mammogram for malignant neoplasm of breast: Secondary | ICD-10-CM

## 2012-06-09 ENCOUNTER — Ambulatory Visit
Admission: RE | Admit: 2012-06-09 | Discharge: 2012-06-09 | Disposition: A | Discharge status: Home / Self Care | Payer: BC Managed Care – PPO | Source: Ambulatory Visit | Attending: Family Medicine | Admitting: Family Medicine

## 2012-06-09 DIAGNOSIS — Z1231 Encounter for screening mammogram for malignant neoplasm of breast: Secondary | ICD-10-CM

## 2012-06-11 ENCOUNTER — Other Ambulatory Visit: Payer: Self-pay | Admitting: Family Medicine

## 2012-06-11 DIAGNOSIS — R928 Other abnormal and inconclusive findings on diagnostic imaging of breast: Secondary | ICD-10-CM

## 2012-06-17 ENCOUNTER — Ambulatory Visit
Admission: RE | Admit: 2012-06-17 | Discharge: 2012-06-17 | Disposition: A | Discharge status: Home / Self Care | Payer: BC Managed Care – PPO | Source: Ambulatory Visit | Attending: Family Medicine | Admitting: Family Medicine

## 2012-06-17 DIAGNOSIS — R928 Other abnormal and inconclusive findings on diagnostic imaging of breast: Secondary | ICD-10-CM

## 2012-07-02 ENCOUNTER — Other Ambulatory Visit: Payer: Self-pay | Admitting: Family Medicine

## 2012-07-05 ENCOUNTER — Other Ambulatory Visit: Payer: Self-pay | Admitting: Family Medicine

## 2012-09-24 ENCOUNTER — Encounter: Payer: Self-pay | Admitting: Family Medicine

## 2012-09-24 ENCOUNTER — Ambulatory Visit (INDEPENDENT_AMBULATORY_CARE_PROVIDER_SITE_OTHER): Payer: BC Managed Care – PPO | Admitting: Family Medicine

## 2012-09-24 VITALS — BP 142/85 | HR 64 | Ht 63.0 in | Wt 142.0 lb

## 2012-09-24 DIAGNOSIS — Z79890 Hormone replacement therapy (postmenopausal): Secondary | ICD-10-CM

## 2012-09-24 DIAGNOSIS — Z0000 Encounter for general adult medical examination without abnormal findings: Secondary | ICD-10-CM

## 2012-09-24 DIAGNOSIS — G4700 Insomnia, unspecified: Secondary | ICD-10-CM

## 2012-09-24 DIAGNOSIS — Z23 Encounter for immunization: Secondary | ICD-10-CM

## 2012-09-24 LAB — LIPID PANEL
HDL: 48 mg/dL (ref >39)
LDL Cholesterol: 120 mg/dL — ABNORMAL HIGH (ref 0–99)
Triglycerides: 140 mg/dL (ref <150)
VLDL: 28 mg/dL (ref 0–40)

## 2012-09-24 LAB — COMPLETE METABOLIC PANEL WITH GFR
ALT: 17 U/L (ref 0–35)
AST: 21 U/L (ref 0–37)
Creat: 0.9 mg/dL (ref 0.50–1.10)
Sodium: 140 mEq/L (ref 135–145)
Total Bilirubin: 0.5 mg/dL (ref 0.3–1.2)
Total Protein: 7 g/dL (ref 6.0–8.3)

## 2012-09-24 MED ORDER — PRAVASTATIN SODIUM 40 MG PO TABS: 40.0000 mg | ORAL_TABLET | Freq: Every day | ORAL | Status: DC

## 2012-09-24 MED ORDER — ZOLPIDEM TARTRATE 10 MG PO TABS: 10.0000 mg | ORAL_TABLET | Freq: Every evening | ORAL | Status: DC | PRN

## 2012-09-24 NOTE — Patient Instructions (Signed)
Start a regular exercise program and make sure you are eating a healthy diet Try to eat 4 servings of dairy a day  Your vaccines are up to date.   

## 2012-09-24 NOTE — Progress Notes (Signed)
  Subjective:     Deborah Mccullough is a 61 y.o. female and is here for a comprehensive physical exam. The patient reports no problems.  History   Social History  . Marital Status: Married    Spouse Name: N/A    Number of Children: 1  . Years of Education: N/A   Occupational History  . [employer] insurance    Social History Main Topics  . Smoking status: Former Games developer  . Smokeless tobacco: Not on file  . Alcohol Use: 0.0 oz/week    0 drink(s) per week  . Drug Use: No  . Sexually Active: Not on file   Other Topics Concern  . Not on file   Social History Narrative   2 cups caffeine daily. Tries to walk for exercise, about 3 per week.    Health Maintenance  Topic Date Due  . Influenza Vaccine  08/17/2013  . Colonoscopy  10/31/2013  . Mammogram  06/17/2014  . Pap Smear  09/19/2014  . Tetanus/tdap  12/16/2019  . Zostavax  Completed    The following portions of the patient's history were reviewed and updated as appropriate: allergies, current medications, past family history, past medical history, past social history, past surgical history and problem list.  Review of Systems A comprehensive review of systems was negative.   Objective:    BP 142/85  Pulse 64  Ht 5\' 3"  (1.6 m)  Wt 142 lb (64.411 kg)  BMI 25.15 kg/m2 General appearance: alert, cooperative and appears stated age Head: Normocephalic, without obvious abnormality, atraumatic Eyes: conj clear, EOMI, PEERLA Ears: normal TM's and external ear canals both ears Nose: Nares normal. Septum midline. Mucosa normal. No drainage or sinus tenderness. Throat: lips, mucosa, and tongue normal; teeth and gums normal Neck: no adenopathy, no carotid bruit, no JVD, supple, symmetrical, trachea midline and thyroid not enlarged, symmetric, no tenderness/mass/nodules Back: symmetric, no curvature. ROM normal. No CVA tenderness. Lungs: clear to auscultation bilaterally Breasts: normal appearance, no masses or tenderness Heart: regular  rate and rhythm, S1, S2 normal, no murmur, click, rub or gallop Abdomen: soft, non-tender; bowel sounds normal; no masses,  no organomegaly Pelvic: not perfromed Extremities: extremities normal, atraumatic, no cyanosis or edema Pulses: 2+ and symmetric Skin: Skin color, texture, turgor normal. No rashes or lesions Lymph nodes: Cervical, supraclavicular, and axillary nodes normal. Neurologic: Alert and oriented X 3, normal strength and tone. Normal symmetric reflexes. Normal coordination and gait    Assessment:    Healthy female exam.       Plan:     See After Visit Summary for Counseling Recommendations   Start a regular exercise program and make sure you are eating a healthy diet Try to eat 4 servings of dairy a day  Your vaccines are up to date.  Due for screening labs, CMP and lipids.   HRT- Discussed trying to decrease her estradiol to every other day.   Insomnia - discussed new research findings at 10 mg Ambien 10 him on the posterior longer in women and cause sedation and decreased reaction time. She says she actually cut her Ambien into thirds and only takes a third at bedtime. Refill sent to pharmacy.

## 2012-09-25 ENCOUNTER — Other Ambulatory Visit: Payer: Self-pay | Admitting: Family Medicine

## 2012-09-25 MED ORDER — PRAVASTATIN SODIUM 80 MG PO TABS: 80.0000 mg | ORAL_TABLET | Freq: Every day | ORAL | Status: DC

## 2012-10-03 ENCOUNTER — Encounter: Payer: BC Managed Care – PPO | Admitting: Family Medicine

## 2012-11-20 ENCOUNTER — Other Ambulatory Visit: Payer: Self-pay | Admitting: Family Medicine

## 2012-11-20 DIAGNOSIS — E785 Hyperlipidemia, unspecified: Secondary | ICD-10-CM

## 2012-11-21 LAB — LIPID PANEL
HDL: 50 mg/dL (ref >39)
LDL Cholesterol: 95 mg/dL (ref 0–99)
Total CHOL/HDL Ratio: 3.2 Ratio
VLDL: 17 mg/dL (ref 0–40)

## 2012-11-21 LAB — BASIC METABOLIC PANEL WITH GFR
BUN: 20 mg/dL (ref 6–23)
CO2: 25 mEq/L (ref 19–32)
Chloride: 105 mEq/L (ref 96–112)
Creat: 0.95 mg/dL (ref 0.50–1.10)
GFR, Est Non African American: 64 mL/min
Glucose, Bld: 95 mg/dL (ref 70–99)
Potassium: 4.3 mEq/L (ref 3.5–5.3)

## 2012-11-21 NOTE — Progress Notes (Signed)
Quick Note:  All labs are normal. ______ 

## 2013-03-24 ENCOUNTER — Other Ambulatory Visit: Payer: Self-pay | Admitting: Family Medicine

## 2013-04-08 ENCOUNTER — Other Ambulatory Visit: Payer: Self-pay | Admitting: Family Medicine

## 2013-04-14 ENCOUNTER — Other Ambulatory Visit: Payer: Self-pay

## 2013-04-14 DIAGNOSIS — Z1231 Encounter for screening mammogram for malignant neoplasm of breast: Secondary | ICD-10-CM

## 2013-05-14 ENCOUNTER — Telehealth: Payer: Self-pay | Admitting: *Deleted

## 2013-05-14 MED ORDER — ZOLPIDEM TARTRATE 5 MG PO TABS: 5.0000 mg | ORAL_TABLET | Freq: Every evening | ORAL | Status: DC | PRN

## 2013-05-14 NOTE — Telephone Encounter (Signed)
Overall Ambien is still generic and fairly cheep.  New packaging says 5mg  for women. Sent over rx for 30 tabs to use prn.

## 2013-05-14 NOTE — Telephone Encounter (Signed)
Pt calls and wants to know if you could give her something for sleep. Had Ambien in the past but hears it is expensive now.

## 2013-05-15 NOTE — Telephone Encounter (Signed)
Husband advised. 

## 2013-05-18 ENCOUNTER — Telehealth: Payer: Self-pay | Admitting: *Deleted

## 2013-05-18 NOTE — Telephone Encounter (Signed)
Pt would like the zolpidem 10 mg sent to [facility] in [place]. It was [facility]oned [facility] to [facility] but the claim they dont have anything on pt's profile although they have used this pharm for three years. 10 mg is on the pt's history but not on active med list.Pt 's wants to make sure this dose is the cheapest option

## 2013-05-18 NOTE — Telephone Encounter (Signed)
Yes it the cheapest option.

## 2013-05-19 ENCOUNTER — Other Ambulatory Visit: Payer: Self-pay | Admitting: *Deleted

## 2013-05-19 MED ORDER — ZOLPIDEM TARTRATE 10 MG PO TABS: 10.0000 mg | ORAL_TABLET | Freq: Every evening | ORAL | Status: DC | PRN

## 2013-05-19 NOTE — Telephone Encounter (Signed)
rx sent

## 2013-06-17 ENCOUNTER — Ambulatory Visit: Payer: BC Managed Care – PPO

## 2013-06-20 ENCOUNTER — Other Ambulatory Visit: Payer: Self-pay | Admitting: Family Medicine

## 2013-06-24 ENCOUNTER — Other Ambulatory Visit: Payer: Self-pay | Admitting: *Deleted

## 2013-06-24 MED ORDER — PROPRANOLOL HCL ER 60 MG PO CP24: ORAL_CAPSULE | ORAL | Status: DC

## 2013-07-02 ENCOUNTER — Ambulatory Visit
Admission: RE | Admit: 2013-07-02 | Discharge: 2013-07-02 | Disposition: A | Discharge status: Home / Self Care | Payer: BC Managed Care – PPO | Source: Ambulatory Visit

## 2013-07-02 DIAGNOSIS — Z1231 Encounter for screening mammogram for malignant neoplasm of breast: Secondary | ICD-10-CM

## 2013-07-06 ENCOUNTER — Other Ambulatory Visit: Payer: Self-pay | Admitting: Family Medicine

## 2013-07-06 DIAGNOSIS — R928 Other abnormal and inconclusive findings on diagnostic imaging of breast: Secondary | ICD-10-CM

## 2013-07-20 ENCOUNTER — Ambulatory Visit
Admission: RE | Admit: 2013-07-20 | Discharge: 2013-07-20 | Disposition: A | Discharge status: Home / Self Care | Payer: BC Managed Care – PPO | Source: Ambulatory Visit | Attending: Family Medicine | Admitting: Family Medicine

## 2013-07-20 DIAGNOSIS — R928 Other abnormal and inconclusive findings on diagnostic imaging of breast: Secondary | ICD-10-CM

## 2013-08-05 ENCOUNTER — Encounter: Payer: Self-pay | Admitting: Family Medicine

## 2013-09-02 ENCOUNTER — Other Ambulatory Visit (HOSPITAL_COMMUNITY)
Admission: RE | Admit: 2013-09-02 | Discharge: 2013-09-02 | Disposition: A | Discharge status: Home / Self Care | Payer: BC Managed Care – PPO | Source: Ambulatory Visit | Attending: Family Medicine | Admitting: Family Medicine

## 2013-09-02 ENCOUNTER — Encounter: Payer: Self-pay | Admitting: Family Medicine

## 2013-09-02 ENCOUNTER — Ambulatory Visit (INDEPENDENT_AMBULATORY_CARE_PROVIDER_SITE_OTHER): Payer: BC Managed Care – PPO | Admitting: Family Medicine

## 2013-09-02 VITALS — BP 130/64 | HR 70 | Wt 145.0 lb

## 2013-09-02 DIAGNOSIS — N760 Acute vaginitis: Secondary | ICD-10-CM

## 2013-09-02 DIAGNOSIS — H6190 Disorder of external ear, unspecified, unspecified ear: Secondary | ICD-10-CM

## 2013-09-02 DIAGNOSIS — Z124 Encounter for screening for malignant neoplasm of cervix: Secondary | ICD-10-CM

## 2013-09-02 DIAGNOSIS — L738 Other specified follicular disorders: Secondary | ICD-10-CM

## 2013-09-02 DIAGNOSIS — Z01419 Encounter for gynecological examination (general) (routine) without abnormal findings: Secondary | ICD-10-CM | POA: Insufficient documentation

## 2013-09-02 DIAGNOSIS — Z23 Encounter for immunization: Secondary | ICD-10-CM

## 2013-09-02 DIAGNOSIS — H6192 Disorder of left external ear, unspecified: Secondary | ICD-10-CM

## 2013-09-02 DIAGNOSIS — L739 Follicular disorder, unspecified: Secondary | ICD-10-CM

## 2013-09-02 LAB — WET PREP FOR TRICH, YEAST, CLUE
Trich, Wet Prep: NONE SEEN
Yeast Wet Prep HPF POC: NONE SEEN

## 2013-09-02 MED ORDER — DOXYCYCLINE HYCLATE 100 MG PO TABS: 100.0000 mg | ORAL_TABLET | Freq: Two times a day (BID) | ORAL | Status: DC

## 2013-09-02 NOTE — Progress Notes (Signed)
  Subjective:    Patient ID: Deborah Mccullough, female    DOB: Dec 21, 1950, 62 y.o.   MRN: [id number]  HPI Bump in vaginal area with itching x 1 week. Burns when she wipes.  No dysuria. She says she's not sure how long the possibility but did notice them about a week ago. No fevers chills or sweats. No discharge. Last Pap smear was about a year and half ago and was normal.  Bump behind the ear x 1 week as well.  Was red and looks infected.  No pain or tenderness. Thinks it is better today.  She tried to pop it but nothing came out.  No trauma. No fever, chills or sweats.     Review of Systems     Objective:   Physical Exam  Constitutional: She appears well-developed and well-nourished.  HENT:  Head: Normocephalic and atraumatic.  Ears:  Lookes like a very raised pustule vs milia. Round with yellow apperreace w/ mild erythemat around the border. No streaking.   Genitourinary:     mildly erythematous papules ( no vesicles or ulcers).    Skin: Skin is warm and dry.  Psychiatric: She has a normal mood and affect. Her behavior is normal.          Assessment & Plan:  foliculitis- will tx with doxy. Call if not better in one week.  We also did a wet prep to evaluate for yeast and she has a little bit of burning and irritation vaginally. We went ahead and performed a Pap smear as well since I was doing an internal exam. Her last one was about a year and half ago.  Bump on ear looks like folliculitis versus a milia. I  placed her on antibiotic. If the lesion is not improving over the next 2-3 weeks out like her to come back and we may try lancing it.  Vaginitis-wet prep performed. We'll call with results once available.  Flu vaccine given.

## 2013-09-21 ENCOUNTER — Telehealth: Payer: Self-pay

## 2013-09-21 NOTE — Telephone Encounter (Signed)
Deborah Mccullough is very upset because she did not get a call about her PAP. I explained to her the pap results was not sent to [place] the way it should have been. I apologized and stated I would let Dr Linford Arnold know.

## 2013-09-21 NOTE — Telephone Encounter (Signed)
Patient called and left a message on nurse line asking for a return call.   Returned Call: Left message asking patient to call back.  

## 2013-09-21 NOTE — Telephone Encounter (Signed)
Sent through My Chart. Is she able to view pap or the result?

## 2013-09-21 NOTE — Telephone Encounter (Signed)
Patient advised.

## 2013-09-29 ENCOUNTER — Encounter: Payer: Self-pay | Admitting: Family Medicine

## 2013-09-29 ENCOUNTER — Ambulatory Visit (INDEPENDENT_AMBULATORY_CARE_PROVIDER_SITE_OTHER): Payer: BC Managed Care – PPO | Admitting: Family Medicine

## 2013-09-29 VITALS — BP 110/70 | HR 68 | Wt 143.0 lb

## 2013-09-29 DIAGNOSIS — L723 Sebaceous cyst: Secondary | ICD-10-CM

## 2013-09-29 DIAGNOSIS — Z1211 Encounter for screening for malignant neoplasm of colon: Secondary | ICD-10-CM

## 2013-09-29 DIAGNOSIS — L720 Epidermal cyst: Secondary | ICD-10-CM

## 2013-09-29 DIAGNOSIS — Z0000 Encounter for general adult medical examination without abnormal findings: Secondary | ICD-10-CM

## 2013-09-29 LAB — COMPLETE METABOLIC PANEL WITH GFR
BUN: 14 mg/dL (ref 6–23)
CO2: 29 mEq/L (ref 19–32)
GFR, Est African American: 82 mL/min
GFR, Est Non African American: 71 mL/min
Glucose, Bld: 84 mg/dL (ref 70–99)
Sodium: 140 mEq/L (ref 135–145)
Total Bilirubin: 0.5 mg/dL (ref 0.3–1.2)
Total Protein: 6.9 g/dL (ref 6.0–8.3)

## 2013-09-29 LAB — CBC
HCT: 39.1 % (ref 36.0–46.0)
MCH: 31.1 pg (ref 26.0–34.0)
MCV: 89.3 fL (ref 78.0–100.0)
RBC: 4.38 MIL/uL (ref 3.87–5.11)
RDW: 12.9 % (ref 11.5–15.5)
WBC: 4.3 10*3/uL (ref 4.0–10.5)

## 2013-09-29 LAB — LIPID PANEL
Cholesterol: 187 mg/dL (ref 0–200)
HDL: 54 mg/dL (ref >39)

## 2013-09-29 MED ORDER — PROPRANOLOL HCL ER 60 MG PO CP24: ORAL_CAPSULE | ORAL | Status: DC

## 2013-09-29 MED ORDER — ESTRADIOL 1 MG PO TABS: ORAL_TABLET | ORAL | Status: DC

## 2013-09-29 MED ORDER — ZOLPIDEM TARTRATE 10 MG PO TABS: 10.0000 mg | ORAL_TABLET | Freq: Every evening | ORAL | Status: DC | PRN

## 2013-09-29 MED ORDER — PRAVASTATIN SODIUM 80 MG PO TABS: 80.0000 mg | ORAL_TABLET | Freq: Every day | ORAL | Status: DC

## 2013-09-29 NOTE — Patient Instructions (Addendum)
Constipation, Adult Constipation is when a person has fewer than 3 bowel movements a week; has difficulty having a bowel movement; or has stools that are dry, hard, or larger than normal. As people grow older, constipation is more common. If you try to fix constipation with medicines that make you have a bowel movement (laxatives), the problem may get worse. Long-term laxative use may cause the muscles of the colon to become weak. A low-fiber diet, not taking in enough fluids, and taking certain medicines may make constipation worse. CAUSES   Certain medicines, such as antidepressants, pain medicine, iron supplements, antacids, and water pills.   Certain diseases, such as diabetes, irritable bowel syndrome (IBS), thyroid disease, or depression.   Not drinking enough water.   Not eating enough fiber-rich foods.   Stress or travel.  Lack of physical activity or exercise.  Not going to the restroom when there is the urge to have a bowel movement.  Ignoring the urge to have a bowel movement.  Using laxatives too much. SYMPTOMS   Having fewer than 3 bowel movements a week.   Straining to have a bowel movement.   Having hard, dry, or larger than normal stools.   Feeling full or bloated.   Pain in the lower abdomen.  Not feeling relief after having a bowel movement. DIAGNOSIS  Your caregiver will take a medical history and perform a physical exam. Further testing may be done for severe constipation. Some tests may include:   A barium enema X-ray to examine your rectum, colon, and sometimes, your small intestine.  A sigmoidoscopy to examine your lower colon.  A colonoscopy to examine your entire colon. TREATMENT  Treatment will depend on the severity of your constipation and what is causing it. Some dietary treatments include drinking more fluids and eating more fiber-rich foods. Lifestyle treatments may include regular exercise. If these diet and lifestyle recommendations  do not help, your caregiver may recommend taking over-the-counter laxative medicines to help you have bowel movements. Prescription medicines may be prescribed if over-the-counter medicines do not work.  HOME CARE INSTRUCTIONS   Increase dietary fiber in your diet, such as fruits, vegetables, whole grains, and beans. Limit high-fat and processed sugars in your diet, such as [place] fries, hamburgers, cookies, candies, and soda.   A fiber supplement may be added to your diet if you cannot get enough fiber from foods.   Drink enough fluids to keep your urine clear or pale yellow.   Exercise regularly or as directed by your caregiver.   Go to the restroom when you have the urge to go. Do not hold it.  Only take medicines as directed by your caregiver. Do not take other medicines for constipation without talking to your caregiver first. SEEK IMMEDIATE MEDICAL CARE IF:   You have bright red blood in your stool.   Your constipation lasts for more than 4 days or gets worse.   You have abdominal or rectal pain.   You have thin, pencil-like stools.  You have unexplained weight loss. MAKE SURE YOU:   Understand these instructions.  Will watch your condition.  Will get help right away if you are not doing well or get worse. Document Released: 08/31/2004 Document Revised: 02/25/2012 Document Reviewed: 11/06/2011 [facility] Patient Information 2014 [place], [place].   Keep up a regular exercise program and make sure you are eating a healthy diet Try to eat 4 servings of dairy a day, or if you are lactose intolerant take a calcium with  vitamin D daily.  Your vaccines are up to date.

## 2013-09-29 NOTE — Progress Notes (Signed)
Subjective:     Deborah Mccullough is a 62 y.o. female and is here for a comprehensive physical exam. The patient reports problems - milia on the right ear. It's occasionally tender and bothers her. She would like to have it treated.  Constipation-she's been having this on and off for the last couple months. She says she try to increase her water intake but wasn't sure about the fiber. She did want to take too much. She has been taking a stool softener daily for the last week or so. It has helped some. She was also due for colonoscopy in earlier this year but has not gone yet. She is okay with [place] place a referral today. Her last colonoscopy was approximately 10 years ago lumbar GI.  History   Social History  . Marital Status: Married    Spouse Name: N/A    Number of Children: 1  . Years of Education: N/A   Occupational History  . [employer] insurance    Social History Main Topics  . Smoking status: Former Games developer  . Smokeless tobacco: Not on file  . Alcohol Use: 0.0 oz/week    0 drink(s) per week  . Drug Use: No  . Sexual Activity: Not on file   Other Topics Concern  . Not on file   Social History Narrative   2 cups caffeine daily. Tries to walk for exercise, about 3 per week.    Health Maintenance  Topic Date Due  . Colonoscopy  10/31/2013  . Influenza Vaccine  07/17/2014  . Mammogram  07/21/2015  . Pap Smear  09/02/2016  . Tetanus/tdap  12/16/2019  . Zostavax  Completed    The following portions of the patient's history were reviewed and updated as appropriate: allergies, current medications, past family history, past medical history, past social history, past surgical history and problem list.  Review of Systems A comprehensive review of systems was negative.   Objective:    BP 110/70  Pulse 68  Wt 143 lb (64.864 kg)  BMI 25.34 kg/m2 General appearance: alert, cooperative and appears stated age Head: Normocephalic, without obvious abnormality, atraumatic Eyes: conj  clear, EOMi, pEERLA Ears: normal TM's and external ear canals both ears Nose: Nares normal. Septum midline. Mucosa normal. No drainage or sinus tenderness. Throat: lips, mucosa, and tongue normal; teeth and gums normal Neck: no adenopathy, no carotid bruit, no JVD, supple, symmetrical, trachea midline and thyroid not enlarged, symmetric, no tenderness/mass/nodules Back: symmetric, no curvature. ROM normal. No CVA tenderness. Lungs: clear to auscultation bilaterally Breasts: normal appearance, no masses or tenderness Heart: regular rate and rhythm, S1, S2 normal, no murmur, click, rub or gallop Abdomen: soft, non-tender; bowel sounds normal; no masses,  no organomegaly Extremities: extremities normal, atraumatic, no cyanosis or edema Pulses: 2+ and symmetric Skin: Skin color, texture, turgor normal. No rashes or lesions Lymph nodes: Cervical, supraclavicular, and axillary nodes normal. Neurologic: Alert and oriented X 3, normal strength and tone. Normal symmetric reflexes. Normal coordination and gait    Assessment:    Healthy female exam.      Plan:     See After Visit Summary for Counseling Recommendations   Keep up a regular exercise program and make sure you are eating a healthy diet Try to eat 4 servings of dairy a day, or if you are lactose intolerant take a calcium with vitamin D daily.  Your vaccines are up to date.   Milia - discussed tx options. Discussed lancing the lesion. Patient agreed to  do so. Tolerated procedure well. Follow up if any concerns or problems. She does have followup with her dermatologist in the spring.  Constipation-handout given. Increase fiber and water intake. She sees something like MiraLax as it to do that.  Incision and Drainage Procedure Note  Pre-operative Diagnosis: milia  Post-operative Diagnosis: same  Indications: pain  Anesthesia: ethyl chloride spray  Procedure Details  The procedure, risks and complications have been discussed  in detail (including, but not limited to airway compromise, infection, bleeding) with the patient, and the patient has signed consent to the procedure.  The skin was sterilely prepped and draped over the affected area in the usual fashion. After adequate local anesthesia, I&D with a #11 blade was performed on the left milia. Purulent drainage: absent The patient was observed until stable.  Findings: Expressed contens of milia.   EBL: 0.5 cc's  Drains: none  Condition: Tolerated procedure well   Complications: none.

## 2013-09-30 ENCOUNTER — Other Ambulatory Visit: Payer: Self-pay | Admitting: *Deleted

## 2013-12-14 ENCOUNTER — Telehealth: Payer: Self-pay | Admitting: *Deleted

## 2013-12-14 MED ORDER — ATORVASTATIN CALCIUM 40 MG PO TABS: 40.0000 mg | ORAL_TABLET | Freq: Every day | ORAL | Status: DC

## 2013-12-14 NOTE — Telephone Encounter (Signed)
Pt called and stated that she received a call from our office there was no information left on her vm with regards to the call. Also pt was inquiring about her cholesterol meds since dr. Linford Arnold was to change her from pravastatin to lipitor. [facility].Deborah Mccullough [place]

## 2013-12-14 NOTE — Telephone Encounter (Signed)
Ok will send over new rx. Lets recheck lipids in 3-4 months.

## 2013-12-15 ENCOUNTER — Other Ambulatory Visit: Payer: Self-pay | Admitting: *Deleted

## 2013-12-15 NOTE — Telephone Encounter (Signed)
Pt informed.Deborah Mccullough [place]  

## 2014-02-04 ENCOUNTER — Other Ambulatory Visit: Payer: Self-pay | Admitting: Family Medicine

## 2014-03-18 ENCOUNTER — Telehealth: Payer: Self-pay | Admitting: *Deleted

## 2014-03-18 ENCOUNTER — Other Ambulatory Visit: Payer: Self-pay | Admitting: *Deleted

## 2014-03-18 DIAGNOSIS — E7800 Pure hypercholesterolemia, unspecified: Secondary | ICD-10-CM

## 2014-03-18 NOTE — Telephone Encounter (Signed)
Spoke with pt and her husband. They could not understand why her cholesterol med has been changed why Dr. Madilyn Fireman doesn't wish to recheck her labs since changing this medication. I informed her that according to Dr. Morrie Sheldon note she did not wish to repeat her cholesterol or mention anything about rechecking this after starting new medication only that she would be changing this medication to help with lowering her LDL. And that she will need to f/u in 6 mos for her BP. I told them that I can order the labs for her and notify Dr. Madilyn Fireman of this if this is what they wanted, I also told them that insurance doesn't always pay for some of these repeat labs if they are done within a certain time period.  He replied that his cardiologist just switched his medication and had him to come in to have his labs rechecked to see how/if the new med is working. I told him that I understood this however, it depended on the circumstances of the individual and sometimes they will pay depending on what is going on. I reiterated to him that if Dr. Madilyn Fireman wished for her to have her cholesterol rechecked she would've stated this on the results, I reread Dr. Gardiner Ramus recommendations to them and what I said to her when I called to give her the results. Pt's husband stated that there has been a lot of miscommunication with regards to his wife's results and appointments for a long time and he is really not happy about this. He asked about the policy for switching over to a different pcp. I told him that it would be in the best interest of the patient and the pcp that they discuss whatever concerns that they may have with Dr. Madilyn Fireman before changing so that she can better understand what is going on with their dissatisfaction and make the needed improvements. He voiced understanding. I told him that I would forward this to Dr. Madilyn Fireman .Audelia Hives [place]

## 2014-03-19 LAB — LIPID PANEL
Cholesterol: 142 mg/dL (ref 0–200)
HDL: 51 mg/dL (ref >39)
LDL CALC: 72 mg/dL (ref 0–99)
TRIGLYCERIDES: 94 mg/dL (ref <150)
Total CHOL/HDL Ratio: 2.8 Ratio
VLDL: 19 mg/dL (ref 0–40)

## 2014-03-21 NOTE — Telephone Encounter (Signed)
Call pt: In October she had just filled a 90 days supply of her old cholesterol pill so didn't want to start the lipitor yet.  She then called at the end of Decmber for the lipitor Rx.  She was informed on 12/28th when we called in the first Rx for lipitor that she did need to have bloodwork rechecked in 3-4 month. That would give her a full 90 days on the new rx to make sure it was working better than the pravastatin she was previously taking.

## 2014-03-22 NOTE — Telephone Encounter (Signed)
Pt informed.Deborah Mccullough  

## 2014-03-24 ENCOUNTER — Encounter: Payer: Self-pay | Admitting: Family Medicine

## 2014-03-24 ENCOUNTER — Ambulatory Visit (INDEPENDENT_AMBULATORY_CARE_PROVIDER_SITE_OTHER): Payer: BC Managed Care – PPO | Admitting: Family Medicine

## 2014-03-24 VITALS — BP 154/91 | HR 68 | Ht 63.0 in | Wt 144.0 lb

## 2014-03-24 DIAGNOSIS — M19049 Primary osteoarthritis, unspecified hand: Secondary | ICD-10-CM

## 2014-03-24 DIAGNOSIS — E785 Hyperlipidemia, unspecified: Secondary | ICD-10-CM

## 2014-03-24 DIAGNOSIS — I10 Essential (primary) hypertension: Secondary | ICD-10-CM

## 2014-03-24 MED ORDER — METOPROLOL SUCCINATE ER 50 MG PO TB24: 50.0000 mg | ORAL_TABLET | Freq: Every day | ORAL | Status: DC

## 2014-03-24 NOTE — Progress Notes (Addendum)
   Subjective:    Patient ID: Deborah Mccullough, female    DOB: 09/15/1951, 63 y.o.   MRN: [id number]  HPI  HTN- Home BP running 120-130s/80.  No CP, SOB, or hear flutters.  She would like to discuss the propranolol. Evidently the cost has gone up greatly. At 90 day supply will be over $100 and the pharmacist had suggested switching to metoprolol which is much less expensive. She would like to try the extended release version.  Hyperlipidemia - Using the lipitor for 3 months now. Tolerating well. No myalgias or side effects. She did have her blood work from last week.  She also has pain and swelling at the DIP on the fifth joint her hand. She says it's tender she accidentally bumps it while washing dishes etc. She has known arthritis. No family history rheumatoid arthritis. She denies any swelling in the MCPs or PIPs. She denies any trauma or injury.  Review of Systems     Objective:   Physical Exam  Constitutional: She is oriented to person, place, and time. She appears well-developed and well-nourished.  HENT:  Head: Normocephalic and atraumatic.  Cardiovascular: Normal rate, regular rhythm and normal heart sounds.   Pulmonary/Chest: Effort normal and breath sounds normal.  Neurological: She is alert and oriented to person, place, and time.  Skin: Skin is warm and dry.  Psychiatric: She has a normal mood and affect. Her behavior is normal.   She does have some swelling and deformity of the PIP of the fifth digit on the right hand I believe. Tender with palpation.      Assessment & Plan:  HTN - well controlled at home. But elevated today. Repeat blood pressure was also still mildly elevated. Since home blood pressures have been well controlled we will keep an eye on this. We'll switch to metoprolol for cost reasons. She prefers extended release over the regular release though it is slightly more expensive. I would like to have her come back in 4-6 weeks for blood pressure check just to  make sure well-regulated on the new regimen. We may need to adjust the dose as there is not an exact version between propranolol and metoprolol.  Hyperlipidemia.- Doing well on statin.  No side effects. An LDL is at goal. We'll continue current regimen and check her liver enzymes in 6 months.  Osteoarthritis of fifth digit-with no other signs of joint swelling in the hands and less likely to suspect rheumatoid arthritis. Most likely osteoarthritis. We could get an x-ray for further evaluation if she would like or she could consider seeing our sports medicine doctor for possible injection for pain relief and to improve inflammation. When her that this is not necessarily permanent does not necessarily reverse the arthritis that can provide some pain relief. I gave her his card if she would like to move forward with that.  Due for screening colonoscopy. She says she's not sure she wants to do it this year. She wants to consider waiting until she has Medicare. I encouraged her to please call her current insurance company. The coverage may be very similar if not better and she may want to move forward with it. In case she decides not to do it I did go ahead and give her some stool cards to do this year.

## 2014-04-05 ENCOUNTER — Other Ambulatory Visit: Payer: Self-pay | Admitting: *Deleted

## 2014-04-05 DIAGNOSIS — Z1211 Encounter for screening for malignant neoplasm of colon: Secondary | ICD-10-CM

## 2014-04-05 LAB — POC HEMOCCULT BLD/STL (HOME/3-CARD/SCREEN)
Card #3 Fecal Occult Blood, POC: NEGATIVE
FECAL OCCULT BLD: NEGATIVE
Fecal Occult Blood, POC: NEGATIVE

## 2014-04-27 ENCOUNTER — Other Ambulatory Visit: Payer: Self-pay | Admitting: Family Medicine

## 2014-04-27 ENCOUNTER — Encounter: Payer: Self-pay | Admitting: Family Medicine

## 2014-04-27 ENCOUNTER — Ambulatory Visit (INDEPENDENT_AMBULATORY_CARE_PROVIDER_SITE_OTHER): Payer: BC Managed Care – PPO | Admitting: Family Medicine

## 2014-04-27 VITALS — BP 127/66 | HR 69

## 2014-04-27 DIAGNOSIS — I10 Essential (primary) hypertension: Secondary | ICD-10-CM

## 2014-04-27 MED ORDER — METOPROLOL SUCCINATE ER 50 MG PO TB24: 50.0000 mg | ORAL_TABLET | Freq: Every day | ORAL | Status: DC

## 2014-04-27 NOTE — Progress Notes (Signed)
   Subjective:    Patient ID: Deborah Mccullough, female    DOB: 15-Dec-1951, 63 y.o.   MRN: [id number] Pt her today for BP check. Pt also left list of BPs taken at home which will be sent to be scanned. Refilling pt's BP med today and pt will schedule for f/u in 3 months with provider.  Oscar La, LPN  HPI    Review of Systems     Objective:   Physical Exam        Assessment & Plan:  HTN - well controlled. F/U in 3 months. Beatrice Lecher, MD

## 2014-06-21 ENCOUNTER — Other Ambulatory Visit: Payer: Self-pay | Admitting: Family Medicine

## 2014-07-05 ENCOUNTER — Other Ambulatory Visit: Payer: Self-pay

## 2014-07-05 ENCOUNTER — Other Ambulatory Visit: Payer: Self-pay | Admitting: Sports Medicine

## 2014-07-20 ENCOUNTER — Other Ambulatory Visit: Payer: Self-pay

## 2014-07-20 DIAGNOSIS — Z1231 Encounter for screening mammogram for malignant neoplasm of breast: Secondary | ICD-10-CM

## 2014-07-26 ENCOUNTER — Other Ambulatory Visit: Payer: Self-pay | Admitting: Family Medicine

## 2014-07-26 ENCOUNTER — Ambulatory Visit
Admission: RE | Admit: 2014-07-26 | Discharge: 2014-07-26 | Disposition: A | Discharge status: Home / Self Care | Payer: BC Managed Care – PPO | Source: Ambulatory Visit

## 2014-07-26 DIAGNOSIS — Z1231 Encounter for screening mammogram for malignant neoplasm of breast: Secondary | ICD-10-CM

## 2014-07-28 ENCOUNTER — Ambulatory Visit: Payer: BC Managed Care – PPO | Admitting: Family Medicine

## 2014-09-07 ENCOUNTER — Ambulatory Visit (INDEPENDENT_AMBULATORY_CARE_PROVIDER_SITE_OTHER): Payer: BC Managed Care – PPO | Admitting: Family Medicine

## 2014-09-07 ENCOUNTER — Encounter: Payer: Self-pay | Admitting: Family Medicine

## 2014-09-07 VITALS — BP 158/84 | HR 68 | Ht 62.0 in | Wt 142.0 lb

## 2014-09-07 DIAGNOSIS — H6982 Other specified disorders of Eustachian tube, left ear: Secondary | ICD-10-CM

## 2014-09-07 DIAGNOSIS — H6980 Other specified disorders of Eustachian tube, unspecified ear: Secondary | ICD-10-CM

## 2014-09-07 DIAGNOSIS — H6990 Unspecified Eustachian tube disorder, unspecified ear: Secondary | ICD-10-CM | POA: Diagnosis not present

## 2014-09-07 MED ORDER — PREDNISONE 20 MG PO TABS: ORAL_TABLET | ORAL | Status: AC

## 2014-09-07 NOTE — Progress Notes (Signed)
CC: Deborah Mccullough is a 63 y.o. female is here for Otalgia   Subjective: HPI:  Complains of left ear pain along with mild hearing loss over the past 2 weeks. Interventions have included using over-the-counter ear drops for wax removal but no benefit in symptoms. Has tried Advil as well without much benefit. Symptoms are persistent, present all hours of the day. Nothing particular seems to make it better or worse. She's never had this before. was originally accompanied by left sore throat however this resolved one week ago. Denies drainage from the ear, fevers, chills, cough, postnasal drip, sinus pressure    Review Of Systems Outlined In HPI  Past Medical History  Diagnosis Date  . Depression   . Anxiety   . Insomnia   . Migraine   . Menopausal syndrome   . Allergy   . Treadmill stress test negative for angina pectoris 10/08/2011    Past Surgical History  Procedure Laterality Date  . Dilation and curettage of uterus     Family History  Problem Relation Age of Onset  . Alcohol abuse Mother   . Cancer Mother     throat  . Alcohol abuse Father   . Depression Father   . HIV Brother   . Diabetes Maternal Grandfather   . ADD / ADHD Sister   . Multiple sclerosis Paternal Uncle   . Bipolar disorder Other     nephew    History   Social History  . Marital Status: Married    Spouse Name: N/A    Number of Children: 1  . Years of Education: N/A   Occupational History  . [employer] insurance    Social History Main Topics  . Smoking status: Former Research scientist (life sciences)  . Smokeless tobacco: Not on file  . Alcohol Use: 0.0 oz/week    0 drink(s) per week  . Drug Use: No  . Sexual Activity: Not on file   Other Topics Concern  . Not on file   Social History Narrative   2 cups caffeine daily. Tries to walk for exercise, about 3 per week.      Objective: BP 158/84  Pulse 68  Ht 5\' 2"  (1.575 m)  Wt 142 lb (64.411 kg)  BMI 25.97 kg/m2  General: Alert and Oriented, No Acute  Distress HEENT: Pupils equal, round, reactive to light. Conjunctivae clear.  External ears unremarkable, canals clear with intact TMs with appropriate landmarks.  Right Middle ear appears open without effusion. Left middle ear has a serous effusion. Pink inferior turbinates.  Moist mucous membranes, pharynx without inflammation nor lesions.  Neck supple without palpable lymphadenopathy nor abnormal masses. Neuro: Rinne test normal bilaterally Weber lateralizing to the left Extremities: No peripheral edema.  Strong peripheral pulses.  Mental Status: No depression, anxiety, nor agitation. Skin: Warm and dry.  Assessment & Plan: [name] was seen today for otalgia.  Diagnoses and associated orders for this visit:  Eustachian tube dysfunction, left - predniSONE (DELTASONE) 20 MG tablet; Three tabs at once daily for five days.    serous effusion from eustachian tube dysfunction causing conductive hearing loss and pain therefore start prednisone and consider taking long-acting pseudoephedrine up to 120 mg daily . Signs and symptoms requring emergent/urgent reevaluation were discussed with the patient.  Return if symptoms worsen or fail to improve.

## 2014-09-24 ENCOUNTER — Encounter: Payer: Self-pay | Admitting: Physician Assistant

## 2014-10-01 ENCOUNTER — Other Ambulatory Visit: Payer: Self-pay

## 2014-10-15 ENCOUNTER — Encounter: Payer: BC Managed Care – PPO | Admitting: Family Medicine

## 2014-10-19 ENCOUNTER — Encounter: Payer: Self-pay | Admitting: Family Medicine

## 2014-10-19 ENCOUNTER — Ambulatory Visit (INDEPENDENT_AMBULATORY_CARE_PROVIDER_SITE_OTHER): Payer: Medicare Other | Admitting: *Deleted

## 2014-10-19 ENCOUNTER — Ambulatory Visit (INDEPENDENT_AMBULATORY_CARE_PROVIDER_SITE_OTHER): Payer: Medicare Other | Admitting: Family Medicine

## 2014-10-19 VITALS — BP 131/75 | HR 69 | Temp 98.0°F | Ht 62.0 in | Wt 140.0 lb

## 2014-10-19 DIAGNOSIS — I10 Essential (primary) hypertension: Secondary | ICD-10-CM

## 2014-10-19 DIAGNOSIS — Z1211 Encounter for screening for malignant neoplasm of colon: Secondary | ICD-10-CM

## 2014-10-19 DIAGNOSIS — Z0000 Encounter for general adult medical examination without abnormal findings: Secondary | ICD-10-CM | POA: Diagnosis not present

## 2014-10-19 DIAGNOSIS — Z23 Encounter for immunization: Secondary | ICD-10-CM | POA: Diagnosis not present

## 2014-10-19 DIAGNOSIS — E785 Hyperlipidemia, unspecified: Secondary | ICD-10-CM | POA: Diagnosis not present

## 2014-10-19 LAB — COMPLETE METABOLIC PANEL WITH GFR
ALBUMIN: 4.1 g/dL (ref 3.5–5.2)
ALK PHOS: 49 U/L (ref 39–117)
ALT: 37 U/L — AB (ref 0–35)
AST: 33 U/L (ref 0–37)
BUN: 13 mg/dL (ref 6–23)
CO2: 26 meq/L (ref 19–32)
Calcium: 9.3 mg/dL (ref 8.4–10.5)
Chloride: 106 mEq/L (ref 96–112)
Creat: 0.78 mg/dL (ref 0.50–1.10)
GFR, Est African American: >89 mL/min
GFR, Est Non African American: 81 mL/min
Glucose, Bld: 86 mg/dL (ref 70–99)
POTASSIUM: 4.2 meq/L (ref 3.5–5.3)
SODIUM: 141 meq/L (ref 135–145)
TOTAL PROTEIN: 6.3 g/dL (ref 6.0–8.3)
Total Bilirubin: 0.6 mg/dL (ref 0.2–1.2)

## 2014-10-19 LAB — LIPID PANEL
CHOL/HDL RATIO: 2.7 ratio
Cholesterol: 147 mg/dL (ref 0–200)
HDL: 54 mg/dL (ref >39)
LDL Cholesterol: 78 mg/dL (ref 0–99)
TRIGLYCERIDES: 76 mg/dL (ref <150)
VLDL: 15 mg/dL (ref 0–40)

## 2014-10-19 NOTE — Patient Instructions (Signed)
Keep up a regular exercise program and make sure you are eating a healthy diet Try to eat 4 servings of dairy a day, or if you are lactose intolerant take a calcium with vitamin D daily.  Your vaccines are up to date.   

## 2014-10-19 NOTE — Progress Notes (Signed)
Subjective:    Deborah Mccullough is a 63 y.o. female who presents for Medicare Annual/Subsequent preventive examination.  Preventive Screening-Counseling & Management  Tobacco History  Smoking status  . Former Smoker  Smokeless tobacco  . Not on file     Problems Prior to Visit 1.   Current Problems (verified) Patient Active Problem List   Diagnosis Date Noted  . NECK PAIN, CHRONIC 10/30/2010  . MIGRAINE HEADACHE 09/22/2010  . BENIGN PAROXYSMAL POSITIONAL VERTIGO 09/22/2010  . ESSENTIAL HYPERTENSION, BENIGN 09/22/2010  . MAMMOGRAM, ABNORMAL 05/02/2010  . HYPERCHOLESTEROLEMIA 12/15/2009  . POSTMENOPAUSAL STATUS 12/15/2009  . ALLERGIC RHINITIS 12/13/2008  . DEPRESSION 11/21/2007    Medications Prior to Visit Current Outpatient Prescriptions on File Prior to Visit  Medication Sig Dispense Refill  . AMBULATORY NON FORMULARY MEDICATION Take 1 capsule by mouth daily. Medication Name: [facility] womens multivitamin    . aspirin (ASPIRIN CHILDRENS) 81 MG chewable tablet Chew 81 mg by mouth daily.    Marland Kitchen atorvastatin (LIPITOR) 40 MG tablet TAKE 1 TABLET (40 MG TOTAL) BY MOUTH DAILY. 90 tablet 1  . Docusate Calcium (STOOL SOFTENER PO) Take 1 tablet by mouth daily.    Marland Kitchen estradiol (ESTRACE) 1 MG tablet TAKE 1 TABLET BY MOUTH EVERY DAY 90 tablet 1  . ibuprofen (ADVIL,MOTRIN) 200 MG tablet Take 200 mg by mouth as needed for pain.    . medroxyPROGESTERone (PROVERA) 2.5 MG tablet TAKE 1 TABLET BY MOUTH EVERY DAY 90 tablet 1  . metoprolol succinate (TOPROL-XL) 50 MG 24 hr tablet TAKE 1 TABLET (50 MG TOTAL) BY MOUTH DAILY. TAKE WITH OR IMMEDIATELY FOLLOWING A MEAL. 90 tablet 0  . Probiotic Product (PROBIOTIC PO) Take 1 capsule by mouth daily.    Marland Kitchen zolpidem (AMBIEN) 10 MG tablet TAKE 1 TABLET AT BEDTIME AS NEEDED 90 tablet 0   No current facility-administered medications on file prior to visit.    Current Medications (verified) Current Outpatient Prescriptions  Medication Sig Dispense Refill  .  AMBULATORY NON FORMULARY MEDICATION Take 1 capsule by mouth daily. Medication Name: [place] womens multivitamin    . aspirin (ASPIRIN CHILDRENS) 81 MG chewable tablet Chew 81 mg by mouth daily.    Marland Kitchen atorvastatin (LIPITOR) 40 MG tablet TAKE 1 TABLET (40 MG TOTAL) BY MOUTH DAILY. 90 tablet 1  . Docusate Calcium (STOOL SOFTENER PO) Take 1 tablet by mouth daily.    Marland Kitchen estradiol (ESTRACE) 1 MG tablet TAKE 1 TABLET BY MOUTH EVERY DAY 90 tablet 1  . ibuprofen (ADVIL,MOTRIN) 200 MG tablet Take 200 mg by mouth as needed for pain.    . medroxyPROGESTERone (PROVERA) 2.5 MG tablet TAKE 1 TABLET BY MOUTH EVERY DAY 90 tablet 1  . metoprolol succinate (TOPROL-XL) 50 MG 24 hr tablet TAKE 1 TABLET (50 MG TOTAL) BY MOUTH DAILY. TAKE WITH OR IMMEDIATELY FOLLOWING A MEAL. 90 tablet 0  . Probiotic Product (PROBIOTIC PO) Take 1 capsule by mouth daily.    Marland Kitchen zolpidem (AMBIEN) 10 MG tablet TAKE 1 TABLET AT BEDTIME AS NEEDED 90 tablet 0   No current facility-administered medications for this visit.     Allergies (verified) Codeine phosphate   PAST HISTORY  Family History Family History  Problem Relation Age of Onset  . Alcohol abuse Mother   . Cancer Mother     throat  . Alcohol abuse Father   . Depression Father   . HIV Brother   . Diabetes Maternal Grandfather   . ADD / ADHD Sister   . Multiple  sclerosis Paternal Uncle   . Bipolar disorder Other     nephew    Social History History  Substance Use Topics  . Smoking status: Former Research scientist (life sciences)  . Smokeless tobacco: Not on file  . Alcohol Use: 0.0 oz/week    0 drink(s) per week     Are there smokers in your home (other than you)? No  Risk Factors Current exercise habits: walking some  Dietary issues discussed: None   Cardiac risk factors: advanced age (older than 72 for men, 69 for women), dyslipidemia and hypertension.  Depression Screen (Note: if answer to either of the following is "Yes", a more complete depression screening is indicated)   Over  the past two weeks, have you felt down, depressed or hopeless? No  Over the past two weeks, have you felt little interest or pleasure in doing things? No  Have you lost interest or pleasure in daily life? No  Do you often feel hopeless? No  Do you cry easily over simple problems? No  Activities of Daily Living In your present state of health, do you have any difficulty performing the following activities?:  Driving? No Managing money?  No Feeding yourself? No Getting from bed to chair? No  Climbing a flight of stairs? No Preparing food and eating?: No Bathing or showering? No Getting dressed: No Getting to the toilet? No Using the toilet:No Moving around from place to place: No In the past year have you fallen or had a near fall?:No   Are you sexually active?  Yes  Do you have more than one partner?  No  Hearing Difficulties: Yes Do you often ask people to speak up or repeat themselves? Yes Do you experience ringing or noises in your ears? Yes Do you have difficulty understanding soft or whispered voices? No   Do you feel that you have a problem with memory? No  Do you often misplace items? No  Do you feel safe at home?  No  Cognitive Testing  Alert? Yes  Normal Appearance?Yes  Oriented to person? Yes  Place? Yes   Time? Yes  Recall of three objects?  Yes  Can perform simple calculations? Yes  Displays appropriate judgment?Yes  Can read the correct time from a watch face?Yes   6 CIT score of 0/28 ( normal)   Advanced Directives have been discussed with the patient? Yes  List the Names of Other Physician/Practitioners you currently use: 1.  Dr. Delilah Shan 2. Dr. Elayne Guerin  Indicate any recent Medical Services you may have received from other than [facility] providers in the past year (date may be approximate).  Immunization History  Administered Date(s) Administered  . Influenza Split 09/20/2011, 09/24/2012  . Influenza Whole 10/12/2008, 10/30/2010  .  Influenza,inj,Quad PF,36+ Mos 09/02/2013  . Td 12/15/2009  . Zoster 09/20/2011    Screening Tests Health Maintenance  Topic Date Due  . INFLUENZA VACCINE  07/17/2014  . MAMMOGRAM  07/26/2016  . TETANUS/TDAP  12/16/2019  . COLONOSCOPY  09/24/2021  . ZOSTAVAX  Completed    All answers were reviewed with the patient and necessary referrals were made:  METHENEY,CATHERINE, MD   10/19/2014   History reviewed: allergies, current medications, past family history, past medical history, past social history, past surgical history and problem list  Review of Systems A comprehensive review of systems was negative.    Objective:     Vision by Snellen chart: done 1 month ago at [facility]  Body mass index is 25.6  kg/(m^2). BP 131/75 mmHg  Pulse 69  Temp(Src) 98 F (36.7 C)  Ht 5\' 2"  (1.575 m)  Wt 140 lb (63.504 kg)  BMI 25.60 kg/m2  BP 131/75 mmHg  Pulse 69  Temp(Src) 98 F (36.7 C)  Ht 5\' 2"  (1.575 m)  Wt 140 lb (63.504 kg)  BMI 25.60 kg/m2  General Appearance:    Alert, cooperative, no distress, appears stated age  Head:    Normocephalic, without obvious abnormality, atraumatic  Eyes:    PERRL, conjunctiva/corneas clear, EOM's intact,both eyes  Ears:    Normal TM's and external ear canals, both ears  Nose:   Nares normal, septum midline, mucosa normal, no drainage    or sinus tenderness  Throat:   Lips, mucosa, and tongue normal; teeth and gums normal  Neck:   Supple, symmetrical, trachea midline, no adenopathy;    thyroid:  no enlargement/tenderness/nodules; no carotid   bruit or JVD  Back:     Symmetric, no curvature, ROM normal, no CVA tenderness  Lungs:     Clear to auscultation bilaterally, respirations unlabored  Chest Wall:    No tenderness or deformity   Heart:    Regular rate and rhythm, S1 and S2 normal, no murmur, rub   or gallop  Breast Exam:    No tenderness, masses, or nipple abnormality  Abdomen:     Soft, non-tender, bowel sounds active all four  quadrants,    no masses, no organomegaly  Genitalia:    Not performed  Rectal:    Not performed.   Extremities:   Extremities normal, atraumatic, no cyanosis or edema  Pulses:   2+ and symmetric all extremities  Skin:   Skin color, texture, turgor normal, no rashes or lesions  Lymph nodes:   Cervical, supraclavicular nodes normal  Neurologic:   CNII-XII intact, normal strength, sensation and reflexes    throughout       Assessment:     Welcome to Medicare Exam      Plan:     During the course of the visit the patient was educated and counseled about appropriate screening and preventive services including:   Advanced directives: has NO advanced directive - not interested in additional information  Flu shot given.    Diet review for nutrition referral? Yes ____  Not Indicated __X_   Patient Instructions (the written plan) was given to the patient.  Medicare Attestation I have personally reviewed: The patient's medical and social history Their use of alcohol, tobacco or illicit drugs Their current medications and supplements The patient's functional ability including ADLs,fall risks, home safety risks, cognitive, and hearing and visual impairment Diet and physical activities Evidence for depression or mood disorders  The patient's weight, height, BMI, and visual acuity have been recorded in the chart.  I have made referrals, counseling, and provided education to the patient based on review of the above and I have provided the patient with a written personalized care plan for preventive services.     METHENEY,CATHERINE, MD   10/19/2014

## 2014-10-21 ENCOUNTER — Encounter: Payer: Self-pay | Admitting: Family Medicine

## 2014-10-21 ENCOUNTER — Other Ambulatory Visit: Payer: Self-pay | Admitting: Family Medicine

## 2014-10-21 ENCOUNTER — Other Ambulatory Visit: Payer: Self-pay | Admitting: Dermatology

## 2014-10-21 DIAGNOSIS — Z86018 Personal history of other benign neoplasm: Secondary | ICD-10-CM | POA: Diagnosis not present

## 2014-10-21 DIAGNOSIS — L820 Inflamed seborrheic keratosis: Secondary | ICD-10-CM | POA: Diagnosis not present

## 2014-10-21 DIAGNOSIS — L821 Other seborrheic keratosis: Secondary | ICD-10-CM | POA: Diagnosis not present

## 2014-10-21 DIAGNOSIS — D225 Melanocytic nevi of trunk: Secondary | ICD-10-CM | POA: Diagnosis not present

## 2014-10-21 DIAGNOSIS — D239 Other benign neoplasm of skin, unspecified: Secondary | ICD-10-CM | POA: Diagnosis not present

## 2014-10-21 DIAGNOSIS — D485 Neoplasm of uncertain behavior of skin: Secondary | ICD-10-CM | POA: Diagnosis not present

## 2014-11-04 ENCOUNTER — Encounter: Payer: Self-pay | Admitting: Internal Medicine

## 2014-11-21 ENCOUNTER — Other Ambulatory Visit: Payer: Self-pay | Admitting: Sports Medicine

## 2014-11-22 ENCOUNTER — Other Ambulatory Visit: Payer: Self-pay | Admitting: Sports Medicine

## 2014-12-01 ENCOUNTER — Encounter: Payer: Self-pay | Admitting: Family Medicine

## 2014-12-15 ENCOUNTER — Other Ambulatory Visit: Payer: Self-pay | Admitting: Family Medicine

## 2014-12-20 ENCOUNTER — Telehealth: Payer: Self-pay | Admitting: Family Medicine

## 2014-12-20 ENCOUNTER — Other Ambulatory Visit: Payer: Self-pay | Admitting: Family Medicine

## 2014-12-20 DIAGNOSIS — R748 Abnormal levels of other serum enzymes: Secondary | ICD-10-CM

## 2014-12-20 NOTE — Telephone Encounter (Signed)
Patient called advised that she needs to repeat labs from Nov, 2015 and would like to have labs sent to [facility] in the morning 12/21/14. Thanks

## 2014-12-20 NOTE — Telephone Encounter (Signed)
Order placed and faxed.Deborah Mccullough

## 2014-12-21 LAB — HEPATIC FUNCTION PANEL
ALBUMIN: 4.1 g/dL (ref 3.5–5.2)
ALT: 46 U/L — ABNORMAL HIGH (ref 0–35)
AST: 40 U/L — ABNORMAL HIGH (ref 0–37)
Alkaline Phosphatase: 48 U/L (ref 39–117)
Bilirubin, Direct: 0.1 mg/dL (ref 0.0–0.3)
Indirect Bilirubin: 0.5 mg/dL (ref 0.2–1.2)
Total Bilirubin: 0.6 mg/dL (ref 0.2–1.2)
Total Protein: 6.3 g/dL (ref 6.0–8.3)

## 2014-12-22 ENCOUNTER — Encounter: Payer: Self-pay | Admitting: Family Medicine

## 2014-12-23 ENCOUNTER — Other Ambulatory Visit: Payer: Self-pay | Admitting: Family Medicine

## 2014-12-23 ENCOUNTER — Other Ambulatory Visit: Payer: Self-pay | Admitting: *Deleted

## 2014-12-23 DIAGNOSIS — R748 Abnormal levels of other serum enzymes: Secondary | ICD-10-CM

## 2014-12-23 LAB — HEPATITIS PANEL, ACUTE
HCV AB: NEGATIVE
Hep A IgM: NONREACTIVE
Hep B C IgM: NONREACTIVE
Hepatitis B Surface Ag: NEGATIVE

## 2014-12-28 ENCOUNTER — Ambulatory Visit (AMBULATORY_SURGERY_CENTER): Payer: Self-pay

## 2014-12-28 VITALS — Ht 62.0 in | Wt 142.0 lb

## 2014-12-28 DIAGNOSIS — Z1211 Encounter for screening for malignant neoplasm of colon: Secondary | ICD-10-CM

## 2014-12-28 MED ORDER — MOVIPREP 100 G PO SOLR: ORAL | Status: DC

## 2014-12-28 NOTE — Progress Notes (Signed)
Per pt, no allergies to soy or egg products.Pt not taking any weight loss meds or using  O2 at home. 

## 2014-12-29 ENCOUNTER — Ambulatory Visit (INDEPENDENT_AMBULATORY_CARE_PROVIDER_SITE_OTHER): Payer: Medicare Other

## 2014-12-29 DIAGNOSIS — R748 Abnormal levels of other serum enzymes: Secondary | ICD-10-CM

## 2015-01-11 ENCOUNTER — Ambulatory Visit (AMBULATORY_SURGERY_CENTER): Payer: Medicare Other | Admitting: Internal Medicine

## 2015-01-11 ENCOUNTER — Encounter: Payer: Self-pay | Admitting: Internal Medicine

## 2015-01-11 VITALS — BP 128/72 | HR 64 | Temp 97.7°F | Resp 28 | Ht 62.0 in | Wt 142.0 lb

## 2015-01-11 DIAGNOSIS — F341 Dysthymic disorder: Secondary | ICD-10-CM | POA: Diagnosis not present

## 2015-01-11 DIAGNOSIS — Z1211 Encounter for screening for malignant neoplasm of colon: Secondary | ICD-10-CM

## 2015-01-11 DIAGNOSIS — I10 Essential (primary) hypertension: Secondary | ICD-10-CM | POA: Diagnosis not present

## 2015-01-11 MED ORDER — SODIUM CHLORIDE 0.9 % IV SOLN: 500.0000 mL | INTRAVENOUS | Status: DC

## 2015-01-11 NOTE — Progress Notes (Signed)
Pt. Spoke with Dr. Henrene Pastor about taking meds for nausea if needed when she gets home.  He advised her that it would be OK to take meds.

## 2015-01-11 NOTE — Progress Notes (Signed)
Patient awakening,vss,report to rn 

## 2015-01-11 NOTE — Patient Instructions (Signed)

## 2015-01-11 NOTE — Op Note (Signed)
[place]  [address]. [place], [phone]   COLONOSCOPY PROCEDURE REPORT  PATIENT: Deborah Mccullough, Deborah Mccullough  MR#: [id number] BIRTHDATE: [birth year] , 42  yrs. old GENDER: female ENDOSCOPIST: Eustace Quail, MD REFERRED LF:[id number] Recall,. PROCEDURE DATE:  01/11/2015 PROCEDURE:   Colonoscopy, screening First Screening Colonoscopy - Avg.  risk and is 50 yrs.  old or older - No.  Prior Negative Screening - Now for repeat screening. 10 or more years since last screening  History of Adenoma - Now for follow-up colonoscopy & has been > or = to 3 yrs.  N/A  Polyps Removed Today? No.  Recommend repeat exam, <10 yrs? No. ASA CLASS:   Class II INDICATIONS:average risk for colorectal cancer. . Index examination November 2004 was negative MEDICATIONS: Monitored anesthesia care and Propofol 250 mg IV  DESCRIPTION OF PROCEDURE:   After the risks benefits and alternatives of the procedure were thoroughly explained, informed consent was obtained.  The digital rectal exam revealed no abnormalities of the rectum.   The LB [id number]  endoscope was introduced through the anus and advanced to the cecum, which was identified by both the appendix and ileocecal valve. No adverse events experienced.   The quality of the prep was excellent, using MoviPrep  The instrument was then slowly withdrawn as the colon was fully examined.      COLON FINDINGS: A normal appearing cecum, ileocecal valve, and appendiceal orifice were identified.  The ascending, transverse, descending, sigmoid colon, and rectum appeared unremarkable. Retroflexed views revealed internal hemorrhoids. The time to cecum=2 minutes 15 seconds.  Withdrawal time=9 minutes 31 seconds. The scope was withdrawn and the procedure completed.  COMPLICATIONS: There were no immediate complications.  ENDOSCOPIC IMPRESSION: Normal colonoscopy  RECOMMENDATIONS: Continue current colorectal screening recommendations for  "routine risk" patients with a repeat colonoscopy in 10 years.  eSigned:  Eustace Quail, MD 01/11/2015 10:03 AM   cc: Beatrice Lecher, MD and The Patient

## 2015-01-11 NOTE — Progress Notes (Signed)
There is a note on the pt admissson half sheet that some sedation causes nausea and vomiting.  I spoke with the pt and explained she would receive propofol for anthesis today.  That the propofol rarely causes n&v.  It had a antiemetic property and is not seem often.  Also noted that pt's left knee has pain and is hard to bend and lay on her side.  I explained that she could lay on her back until right before the exam is started and she will be laying on her left side. maw

## 2015-01-12 ENCOUNTER — Telehealth: Payer: Self-pay | Admitting: *Deleted

## 2015-01-12 NOTE — Telephone Encounter (Signed)
  Follow up Call-  Call back number 01/11/2015  Post procedure Call Back phone  # [phone] hm  Permission to leave phone message Yes     Patient questions:  Do you have a fever, pain , or abdominal swelling? No. Pain Score  0 *  Have you tolerated food without any problems? Yes.    Have you been able to return to your normal activities? Yes.    Do you have any questions about your discharge instructions: Diet   No. Medications  No. Follow up visit  No.  Do you have questions or concerns about your Care? No.  Actions: * If pain score is 4 or above: No action needed, pain <4.

## 2015-01-15 ENCOUNTER — Other Ambulatory Visit: Payer: Self-pay | Admitting: Family Medicine

## 2015-01-18 ENCOUNTER — Other Ambulatory Visit: Payer: Self-pay | Admitting: Family Medicine

## 2015-02-02 ENCOUNTER — Other Ambulatory Visit: Payer: Self-pay | Admitting: Family Medicine

## 2015-02-02 DIAGNOSIS — R945 Abnormal results of liver function studies: Secondary | ICD-10-CM | POA: Diagnosis not present

## 2015-02-02 DIAGNOSIS — R748 Abnormal levels of other serum enzymes: Secondary | ICD-10-CM | POA: Diagnosis not present

## 2015-02-03 LAB — BASIC METABOLIC PANEL WITH GFR
BUN: 18 mg/dL (ref 6–23)
CO2: 28 meq/L (ref 19–32)
Calcium: 9 mg/dL (ref 8.4–10.5)
Chloride: 109 mEq/L (ref 96–112)
Creat: 0.71 mg/dL (ref 0.50–1.10)
GFR, Est African American: >89 mL/min
GFR, Est Non African American: >89 mL/min
Glucose, Bld: 85 mg/dL (ref 70–99)
Potassium: 3.9 mEq/L (ref 3.5–5.3)
SODIUM: 144 meq/L (ref 135–145)

## 2015-02-03 NOTE — Progress Notes (Signed)
Quick Note:  All labs are normal. ______ 

## 2015-02-04 ENCOUNTER — Other Ambulatory Visit: Payer: Self-pay | Admitting: *Deleted

## 2015-02-04 DIAGNOSIS — R748 Abnormal levels of other serum enzymes: Secondary | ICD-10-CM

## 2015-02-04 LAB — HEPATIC FUNCTION PANEL
ALBUMIN: 4.2 g/dL (ref 3.5–5.2)
ALT: 33 U/L (ref 0–35)
AST: 29 U/L (ref 0–37)
Alkaline Phosphatase: 47 U/L (ref 39–117)
Bilirubin, Direct: 0.1 mg/dL (ref 0.0–0.3)
Indirect Bilirubin: 0.2 mg/dL (ref 0.2–1.2)
TOTAL PROTEIN: 6.3 g/dL (ref 6.0–8.3)
Total Bilirubin: 0.3 mg/dL (ref 0.2–1.2)

## 2015-02-04 LAB — HEPATITIS PANEL, ACUTE
HCV Ab: NEGATIVE
HEP B S AG: NEGATIVE
Hep A IgM: NONREACTIVE
Hep B C IgM: NONREACTIVE

## 2015-02-07 ENCOUNTER — Telehealth: Payer: Self-pay | Admitting: *Deleted

## 2015-02-07 NOTE — Telephone Encounter (Signed)
[phone] (Home) [phone] (Mobile)  Left VM concerning results.

## 2015-02-07 NOTE — Telephone Encounter (Signed)
-----   Message from Hali Marry, MD sent at 02/04/2015 12:40 PM EST ----- Call pt: Her liver enzymes are back down to normal which is fantastic. Recommend repeat this in 6 months just to make sure that they are staying in the normal range.

## 2015-02-07 NOTE — Telephone Encounter (Signed)
[phone] (Home) [phone] (Mobile)      Left VM

## 2015-02-07 NOTE — Telephone Encounter (Signed)
-----   Message from Hali Marry, MD sent at 02/03/2015  9:11 AM EST ----- All labs are normal.

## 2015-02-14 ENCOUNTER — Other Ambulatory Visit: Payer: Self-pay | Admitting: Dermatology

## 2015-02-14 DIAGNOSIS — L918 Other hypertrophic disorders of the skin: Secondary | ICD-10-CM | POA: Diagnosis not present

## 2015-02-14 DIAGNOSIS — L821 Other seborrheic keratosis: Secondary | ICD-10-CM | POA: Diagnosis not present

## 2015-02-14 DIAGNOSIS — L820 Inflamed seborrheic keratosis: Secondary | ICD-10-CM | POA: Diagnosis not present

## 2015-02-14 DIAGNOSIS — D485 Neoplasm of uncertain behavior of skin: Secondary | ICD-10-CM | POA: Diagnosis not present

## 2015-04-22 ENCOUNTER — Other Ambulatory Visit: Payer: Self-pay | Admitting: Family Medicine

## 2015-04-22 LAB — HM DIABETES EYE EXAM

## 2015-04-25 ENCOUNTER — Other Ambulatory Visit: Payer: Self-pay | Admitting: Family Medicine

## 2015-05-02 ENCOUNTER — Encounter: Payer: Self-pay | Admitting: Family Medicine

## 2015-05-03 ENCOUNTER — Encounter: Payer: Self-pay | Admitting: Family Medicine

## 2015-05-26 ENCOUNTER — Encounter: Payer: Self-pay | Admitting: Family Medicine

## 2015-05-26 ENCOUNTER — Ambulatory Visit (INDEPENDENT_AMBULATORY_CARE_PROVIDER_SITE_OTHER): Payer: Medicare Other | Admitting: Family Medicine

## 2015-05-26 VITALS — BP 133/73 | HR 65 | Ht 62.0 in | Wt 143.0 lb

## 2015-05-26 DIAGNOSIS — R748 Abnormal levels of other serum enzymes: Secondary | ICD-10-CM | POA: Diagnosis not present

## 2015-05-26 DIAGNOSIS — G4700 Insomnia, unspecified: Secondary | ICD-10-CM | POA: Diagnosis not present

## 2015-05-26 DIAGNOSIS — Z780 Asymptomatic menopausal state: Secondary | ICD-10-CM

## 2015-05-26 DIAGNOSIS — I10 Essential (primary) hypertension: Secondary | ICD-10-CM

## 2015-05-26 LAB — HEPATIC FUNCTION PANEL
ALT: 27 U/L (ref 0–35)
AST: 29 U/L (ref 0–37)
Albumin: 4.1 g/dL (ref 3.5–5.2)
Alkaline Phosphatase: 50 U/L (ref 39–117)
BILIRUBIN DIRECT: 0.1 mg/dL (ref 0.0–0.3)
BILIRUBIN TOTAL: 0.5 mg/dL (ref 0.2–1.2)
Indirect Bilirubin: 0.4 mg/dL (ref 0.2–1.2)
Total Protein: 6.8 g/dL (ref 6.0–8.3)

## 2015-05-26 NOTE — Progress Notes (Signed)
   Subjective:    Patient ID: Deborah Mccullough, female    DOB: August 12, 1951, 64 y.o.   MRN: [id number]  HPI Hypertension- Pt denies chest pain, SOB, dizziness, or heart palpitations.  Taking meds as directed w/o problems.  Denies medication side effects.  Hasn't been walking.   Insomnia- she is current only Ambien. Takes half a tab nightly. No SE.   Feels like it makes her more hungry.   History of elevated liver enzymes. They were elevated last fall. We repeated the test back in February and had come back down into the normal range but I asked her to come back so that we could check it again this summer to make sure that they are staying low.  Review of Systems     Objective:   Physical Exam  Constitutional: She is oriented to person, place, and time. She appears well-developed and well-nourished.  HENT:  Head: Normocephalic and atraumatic.  Cardiovascular: Normal rate, regular rhythm and normal heart sounds.   Pulmonary/Chest: Effort normal and breath sounds normal.  Neurological: She is alert and oriented to person, place, and time.  Skin: Skin is warm and dry.  Psychiatric: She has a normal mood and affect. Her behavior is normal.          Assessment & Plan:  Hypertension - well controlled.  Continue current regimen. F/U in 6 months. Encouraged her to start walking again.    Insomnia- well controlled.  Continue current regimen  She occ splits the 5mg .  F/u in 6 months. Warbed about excess sedation and monitoring for S.E.   Elevated liver enzymes-due to recheck today. liver US was normal.   Order bone density

## 2015-06-13 ENCOUNTER — Other Ambulatory Visit: Payer: Self-pay

## 2015-06-16 ENCOUNTER — Other Ambulatory Visit: Payer: Self-pay | Admitting: Family Medicine

## 2015-07-07 DIAGNOSIS — M1712 Unilateral primary osteoarthritis, left knee: Secondary | ICD-10-CM | POA: Diagnosis not present

## 2015-07-07 DIAGNOSIS — M25662 Stiffness of left knee, not elsewhere classified: Secondary | ICD-10-CM | POA: Diagnosis not present

## 2015-07-28 ENCOUNTER — Other Ambulatory Visit: Payer: Self-pay | Admitting: Family Medicine

## 2015-07-28 DIAGNOSIS — E2839 Other primary ovarian failure: Secondary | ICD-10-CM

## 2015-08-02 ENCOUNTER — Other Ambulatory Visit: Payer: Self-pay

## 2015-08-02 DIAGNOSIS — Z1231 Encounter for screening mammogram for malignant neoplasm of breast: Secondary | ICD-10-CM

## 2015-09-09 ENCOUNTER — Ambulatory Visit
Admission: RE | Admit: 2015-09-09 | Discharge: 2015-09-09 | Disposition: A | Discharge status: Home / Self Care | Payer: Medicare Other | Source: Ambulatory Visit

## 2015-09-09 ENCOUNTER — Ambulatory Visit
Admission: RE | Admit: 2015-09-09 | Discharge: 2015-09-09 | Disposition: A | Discharge status: Home / Self Care | Payer: Medicare Other | Source: Ambulatory Visit | Attending: Family Medicine | Admitting: Family Medicine

## 2015-09-09 DIAGNOSIS — E2839 Other primary ovarian failure: Secondary | ICD-10-CM

## 2015-09-09 DIAGNOSIS — M859 Disorder of bone density and structure, unspecified: Secondary | ICD-10-CM | POA: Diagnosis not present

## 2015-09-09 DIAGNOSIS — Z1231 Encounter for screening mammogram for malignant neoplasm of breast: Secondary | ICD-10-CM

## 2015-09-09 DIAGNOSIS — Z780 Asymptomatic menopausal state: Secondary | ICD-10-CM | POA: Diagnosis not present

## 2015-09-16 ENCOUNTER — Ambulatory Visit (INDEPENDENT_AMBULATORY_CARE_PROVIDER_SITE_OTHER): Payer: Medicare Other | Admitting: Family Medicine

## 2015-09-16 ENCOUNTER — Encounter: Payer: Self-pay | Admitting: Family Medicine

## 2015-09-16 VITALS — BP 152/85 | HR 62 | Temp 98.2°F | Wt 148.0 lb

## 2015-09-16 DIAGNOSIS — H6122 Impacted cerumen, left ear: Secondary | ICD-10-CM

## 2015-09-16 DIAGNOSIS — H6982 Other specified disorders of Eustachian tube, left ear: Secondary | ICD-10-CM | POA: Diagnosis not present

## 2015-09-16 MED ORDER — PREDNISONE 20 MG PO TABS: ORAL_TABLET | ORAL | Status: AC
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
## 2015-09-19 ENCOUNTER — Other Ambulatory Visit: Payer: Self-pay | Admitting: Family Medicine

## 2015-09-22 ENCOUNTER — Other Ambulatory Visit: Payer: Self-pay | Admitting: Family Medicine

## 2015-09-24 ENCOUNTER — Other Ambulatory Visit: Payer: Self-pay | Admitting: Family Medicine

## 2015-09-29 ENCOUNTER — Telehealth: Payer: Self-pay | Admitting: *Deleted

## 2015-09-29 NOTE — Telephone Encounter (Signed)
Pharmacy states they have not received ambien rx that was sent 10/3. Called and left rx on the provider line.

## 2015-10-04 ENCOUNTER — Encounter: Payer: Self-pay | Admitting: Family Medicine

## 2015-10-07 ENCOUNTER — Telehealth: Payer: Self-pay | Admitting: Family Medicine

## 2015-10-07 NOTE — Telephone Encounter (Signed)
She will have to take something over-the-counter in the meantime to fill in between the prescriptions. She can take 25 or 50 mg of Benadryl if needed. She can also try valerian root capsules. Or can consider melatonin as well.

## 2015-10-07 NOTE — Telephone Encounter (Signed)
Pt called to state she cant get her Ambien Rx. Called [facility] and the Pt is only allowed a certain number of pills per time frame and we will need to do an exclusion to get more quantity. They are going to send [place] the phone number for that authorization. Pt questions what she can take in the mean time since she only has "a couple pills left." Will route to PCP for review.

## 2015-10-07 NOTE — Telephone Encounter (Signed)
Received fax for prior authorization on Ambien due to quantity override sent through cover my meds waiting on authorization. - CF

## 2015-10-07 NOTE — Telephone Encounter (Signed)
Spoke with Pt's husband, advised of recommendations. Verbalized understanding, no further questions.

## 2015-10-10 NOTE — Telephone Encounter (Signed)
Received fax from [facility] and Zolpidem Tartrate is approved for 30 per 30 from 07/09/2015 - 12/17/2015. Pharmacy has been notified. - CF

## 2015-10-23 ENCOUNTER — Other Ambulatory Visit: Payer: Self-pay | Admitting: Family Medicine

## 2015-10-27 DIAGNOSIS — H2513 Age-related nuclear cataract, bilateral: Secondary | ICD-10-CM | POA: Diagnosis not present

## 2015-10-31 ENCOUNTER — Encounter: Payer: Self-pay | Admitting: Family Medicine

## 2015-10-31 ENCOUNTER — Ambulatory Visit (INDEPENDENT_AMBULATORY_CARE_PROVIDER_SITE_OTHER): Payer: Medicare Other | Admitting: Family Medicine

## 2015-10-31 VITALS — BP 132/68 | HR 67 | Temp 98.4°F | Resp 18 | Ht 62.0 in | Wt 143.6 lb

## 2015-10-31 DIAGNOSIS — H9202 Otalgia, left ear: Secondary | ICD-10-CM

## 2015-10-31 DIAGNOSIS — Z23 Encounter for immunization: Secondary | ICD-10-CM

## 2015-10-31 DIAGNOSIS — I10 Essential (primary) hypertension: Secondary | ICD-10-CM

## 2015-10-31 DIAGNOSIS — E8809 Other disorders of plasma-protein metabolism, not elsewhere classified: Secondary | ICD-10-CM

## 2015-10-31 DIAGNOSIS — K219 Gastro-esophageal reflux disease without esophagitis: Secondary | ICD-10-CM

## 2015-10-31 DIAGNOSIS — Z0000 Encounter for general adult medical examination without abnormal findings: Secondary | ICD-10-CM

## 2015-10-31 LAB — COMPLETE METABOLIC PANEL WITH GFR
ALT: 24 U/L (ref 6–29)
AST: 26 U/L (ref 10–35)
Albumin: 3.9 g/dL (ref 3.6–5.1)
Alkaline Phosphatase: 42 U/L (ref 33–130)
BILIRUBIN TOTAL: 0.5 mg/dL (ref 0.2–1.2)
BUN: 16 mg/dL (ref 7–25)
CHLORIDE: 107 mmol/L (ref 98–110)
CO2: 25 mmol/L (ref 20–31)
Calcium: 8.9 mg/dL (ref 8.6–10.4)
Creat: 0.81 mg/dL (ref 0.50–0.99)
GFR, EST AFRICAN AMERICAN: 88 mL/min (ref >=60)
GFR, EST NON AFRICAN AMERICAN: 76 mL/min (ref >=60)
Glucose, Bld: 83 mg/dL (ref 65–99)
Potassium: 4.1 mmol/L (ref 3.5–5.3)
Sodium: 142 mmol/L (ref 135–146)
Total Protein: 6 g/dL — ABNORMAL LOW (ref 6.1–8.1)

## 2015-10-31 LAB — LIPID PANEL
Cholesterol: 144 mg/dL (ref 125–200)
HDL: 55 mg/dL (ref >=46)
LDL CALC: 69 mg/dL (ref <130)
TRIGLYCERIDES: 99 mg/dL (ref <150)
Total CHOL/HDL Ratio: 2.6 Ratio (ref <=5.0)
VLDL: 20 mg/dL (ref <30)

## 2015-10-31 MED ORDER — PREDNISONE 20 MG PO TABS: 40.0000 mg | ORAL_TABLET | Freq: Every day | ORAL | Status: DC

## 2015-10-31 MED ORDER — OMEPRAZOLE 20 MG PO CPDR: 20.0000 mg | DELAYED_RELEASE_CAPSULE | Freq: Every day | ORAL | Status: DC

## 2015-10-31 NOTE — Progress Notes (Signed)
Subjective:    Deborah Mccullough is a 64 y.o. female who presents for Medicare Annual/Subsequent preventive examination.  Preventive Screening-Counseling & Management  Tobacco History  Smoking status  . Former Smoker  . Types: Cigarettes  Smokeless tobacco  . Never Used    Comment: smoked in her 20's     Problems Prior to Visit 1. She has been having heartburn. She is taking TUMs 2-3 times a week. No chest pain per se. The times does seem to work well.  #2 she still having a lot of pressure in her left ear. She came in recently for this and was given a course of prednisone which she says really helped. She starting to get some pressure and pain in the ear again. She denies any other allergy symptoms.    Current Problems (verified) Patient Active Problem List   Diagnosis Date Noted  . NECK PAIN, CHRONIC 10/30/2010  . MIGRAINE HEADACHE 09/22/2010  . BENIGN PAROXYSMAL POSITIONAL VERTIGO 09/22/2010  . ESSENTIAL HYPERTENSION, BENIGN 09/22/2010  . MAMMOGRAM, ABNORMAL 05/02/2010  . HYPERCHOLESTEROLEMIA 12/15/2009  . POSTMENOPAUSAL STATUS 12/15/2009  . ALLERGIC RHINITIS 12/13/2008  . DEPRESSION 11/21/2007    Medications Prior to Visit Current Outpatient Prescriptions on File Prior to Visit  Medication Sig Dispense Refill  . AMBULATORY NON FORMULARY MEDICATION Take 1 capsule by mouth daily. Equate One Daily Vitamin    . aspirin (ASPIRIN CHILDRENS) 81 MG chewable tablet Chew 81 mg by mouth daily.    Marland Kitchen atorvastatin (LIPITOR) 40 MG tablet TAKE 1 TABLET (40 MG TOTAL) BY MOUTH DAILY. 90 tablet 1  . Docusate Calcium (STOOL SOFTENER PO) Take 1 tablet by mouth every other day.     . estradiol (ESTRACE) 1 MG tablet TAKE 1 TABLET BY MOUTH EVERY DAY 90 tablet 1  . ibuprofen (ADVIL,MOTRIN) 200 MG tablet Take 200 mg by mouth as needed for pain.    . medroxyPROGESTERone (PROVERA) 2.5 MG tablet TAKE 1 TABLET BY MOUTH EVERY DAY 90 tablet 1  . metoprolol succinate (TOPROL-XL) 50 MG 24 hr tablet TAKE  1 TABLET (50 MG TOTAL) BY MOUTH DAILY. TAKE WITH OR IMMEDIATELY FOLLOWING A MEAL. 90 tablet 0  . Probiotic Product (PROBIOTIC PO) Take 1 capsule by mouth daily.    . Wheat Dextrin (BENEFIBER PO) Take by mouth daily.    Marland Kitchen zolpidem (AMBIEN) 10 MG tablet TAKE 1 TABLET BY MOUTH AT BEDTIME AS NEEDED 90 tablet 0   No current facility-administered medications on file prior to visit.    Current Medications (verified) Current Outpatient Prescriptions  Medication Sig Dispense Refill  . AMBULATORY NON FORMULARY MEDICATION Take 1 capsule by mouth daily. Equate One Daily Vitamin    . aspirin (ASPIRIN CHILDRENS) 81 MG chewable tablet Chew 81 mg by mouth daily.    Marland Kitchen atorvastatin (LIPITOR) 40 MG tablet TAKE 1 TABLET (40 MG TOTAL) BY MOUTH DAILY. 90 tablet 1  . Docusate Calcium (STOOL SOFTENER PO) Take 1 tablet by mouth every other day.     . estradiol (ESTRACE) 1 MG tablet TAKE 1 TABLET BY MOUTH EVERY DAY 90 tablet 1  . ibuprofen (ADVIL,MOTRIN) 200 MG tablet Take 200 mg by mouth as needed for pain.    . medroxyPROGESTERone (PROVERA) 2.5 MG tablet TAKE 1 TABLET BY MOUTH EVERY DAY 90 tablet 1  . metoprolol succinate (TOPROL-XL) 50 MG 24 hr tablet TAKE 1 TABLET (50 MG TOTAL) BY MOUTH DAILY. TAKE WITH OR IMMEDIATELY FOLLOWING A MEAL. 90 tablet 0  . Probiotic Product (PROBIOTIC PO)  Take 1 capsule by mouth daily.    . Wheat Dextrin (BENEFIBER PO) Take by mouth daily.    Marland Kitchen zolpidem (AMBIEN) 10 MG tablet TAKE 1 TABLET BY MOUTH AT BEDTIME AS NEEDED 90 tablet 0   No current facility-administered medications for this visit.     Allergies (verified) Codeine phosphate   PAST HISTORY  Family History Family History  Problem Relation Age of Onset  . Alcohol abuse Mother   . Cancer Mother     throat  . Alcohol abuse Father   . Depression Father   . HIV Brother   . Diabetes Maternal Grandfather   . ADD / ADHD Sister   . Multiple sclerosis Paternal Uncle   . Bipolar disorder Other     nephew  . Colon  cancer Neg Hx   . Esophageal cancer Neg Hx   . Rectal cancer Neg Hx   . Stomach cancer Neg Hx     Social History Social History  Substance Use Topics  . Smoking status: Former Smoker    Types: Cigarettes  . Smokeless tobacco: Never Used     Comment: smoked in her 20's  . Alcohol Use: No     Are there smokers in your home (other than you)? No  Risk Factors Current exercise habits: walking  Dietary issues discussed: None   Cardiac risk factors: advanced age (older than 24 for men, 44 for women) and hypertension.  Depression Screen (Note: if answer to either of the following is "Yes", a more complete depression screening is indicated)   Over the past two weeks, have you felt down, depressed or hopeless? No  Over the past two weeks, have you felt little interest or pleasure in doing things? No  Have you lost interest or pleasure in daily life? No  Do you often feel hopeless? No  Do you cry easily over simple problems? No  Activities of Daily Living In your present state of health, do you have any difficulty performing the following activities?:  Driving? No Managing money?  No Feeding yourself? No Getting from bed to chair? No   Climbing a flight of stairs? No Preparing food and eating?: No Bathing or showering? No Getting dressed: No Getting to the toilet? No Using the toilet:No Moving around from place to place: No In the past year have you fallen or had a near fall?:No     Hearing Difficulties: No Do you often ask people to speak up or repeat themselves? No Do you experience ringing or noises in your ears? No Do you have difficulty understanding soft or whispered voices? No   Do you feel that you have a problem with memory? No  Do you often misplace items? No  Do you feel safe at home?  Yes  Cognitive Testing  Alert? Yes  Normal Appearance?Yes  Oriented to person? Yes  Place? Yes   Time? Yes  Recall of three objects?  Yes  Can perform simple  calculations? Yes  Displays appropriate judgment?Yes  Can read the correct time from a watch face?Yes   Advanced Directives have been discussed with the patient? Yes  List the Names of Other Physician/Practitioners you currently use: 1.    Indicate any recent Medical Services you may have received from other than [facility] providers in the past year (date may be approximate).  Immunization History  Administered Date(s) Administered  . Influenza Split 09/20/2011, 09/24/2012  . Influenza Whole 10/12/2008, 10/30/2010  . Influenza,inj,Quad PF,36+ Mos 09/02/2013, 10/19/2014  .  Td 12/15/2009  . Zoster 09/20/2011    Screening Tests Health Maintenance  Topic Date Due  . HIV Screening  11/08/1964  . PNA vac Low Risk Adult (1 of 2 - PCV13) 11/08/2014  . INFLUENZA VACCINE  07/18/2015  . PAP SMEAR  09/02/2016  . MAMMOGRAM  09/08/2017  . TETANUS/TDAP  12/16/2019  . COLONOSCOPY  01/11/2025  . DEXA SCAN  Completed  . ZOSTAVAX  Completed  . Hepatitis C Screening  Completed    All answers were reviewed with the patient and necessary referrals were made:  Leonce Bale, MD   10/31/2015   History reviewed: allergies, current medications, past family history, past medical history, past social history, past surgical history and problem list  Review of Systems Pertinent items noted in HPI and remainder of comprehensive ROS otherwise negative.    Objective:     Vision by Snellen chart: Reports eye exam is UTD. Call for records.   Body mass index is 26.26 kg/(m^2). BP 129/68 mmHg  Pulse 67  Temp(Src) 98.4 F (36.9 C) (Oral)  Resp 18  Ht 5\' 2"  (1.575 m)  Wt 143 lb 9.6 oz (65.137 kg)  BMI 26.26 kg/m2  SpO2 100%  BP 132/68 mmHg  Pulse 67  Temp(Src) 98.4 F (36.9 C) (Oral)  Resp 18  Ht 5\' 2"  (1.575 m)  Wt 143 lb 9.6 oz (65.137 kg)  BMI 26.26 kg/m2  SpO2 100% General appearance: alert, cooperative and appears stated age Head: Normocephalic, without obvious abnormality,  atraumatic Eyes: conj clear, EOMI, PEERLA Ears: normal TM's and external ear canals both ears Nose: Nares normal. Septum midline. Mucosa normal. No drainage or sinus tenderness. Throat: lips, mucosa, and tongue normal; teeth and gums normal Neck: no adenopathy, no carotid bruit, no JVD, supple, symmetrical, trachea midline and thyroid not enlarged, symmetric, no tenderness/mass/nodules Back: symmetric, no curvature. ROM normal. No CVA tenderness. Lungs: clear to auscultation bilaterally Breasts: normal appearance, no masses or tenderness Heart: regular rate and rhythm, S1, S2 normal, no murmur, click, rub or gallop Abdomen: soft, non-tender; bowel sounds normal; no masses,  no organomegaly Extremities: extremities normal, atraumatic, no cyanosis or edema Pulses: 2+ and symmetric Skin: Skin color, texture, turgor normal. No rashes or lesions Lymph nodes: Cervical, supraclavicular, and axillary nodes normal. Neurologic: Alert and oriented X 3, normal strength and tone. Normal symmetric reflexes. Normal coordination and gait     Assessment:     Medicare Wellness Exam       Plan:     During the course of the visit the patient was educated and counseled about appropriate screening and preventive services including:    Pneumococcal vaccine  - Prevnar 13 given.   HTN - well controlled. Due for CMP and lipids  Flu vaccine given.   Colonoscopy UTD.   Pap UTD.   GERD-recommend a trial of a PPI. If it's helpful then recommend she take it for a month to reduce her symptoms and then come back off of it. We also reviewed diet and gets her some additional information on foods to avoid in people who have reflux. If the PPI is not effective then please let me know.  Left otalgia-she does have some wax in the eardrum. She said she they tried to irrigate it when she was here last time but was quite painful so they had to stop. Recommend a trial of over-the-counter to proximal. I did give her 5  more days of prednisone but this is not effective or symptoms recur then  we really need to get her in with urination throat for further evaluation. Explained that repeat courses of prednisone is not appropriate long-term.  Diet review for nutrition referral? Yes ____  Not Indicated _X_   Patient Instructions (the written plan) was given to the patient.  Medicare Attestation I have personally reviewed: The patient's medical and social history Their use of alcohol, tobacco or illicit drugs Their current medications and supplements The patient's functional ability including ADLs,fall risks, home safety risks, cognitive, and hearing and visual impairment Diet and physical activities Evidence for depression or mood disorders  The patient's weight, height, BMI, and visual acuity have been recorded in the chart.  I have made referrals, counseling, and provided education to the patient based on review of the above and I have provided the patient with a written personalized care plan for preventive services.     Sorayah Schrodt, MD   10/31/2015

## 2015-10-31 NOTE — Patient Instructions (Addendum)
Try ear wax drops called Debrox   Food Choices for Gastroesophageal Reflux Disease, Adult When you have gastroesophageal reflux disease (GERD), the foods you eat and your eating habits are very important. Choosing the right foods can help ease the discomfort of GERD. WHAT GENERAL GUIDELINES DO I NEED TO FOLLOW?  Choose fruits, vegetables, whole grains, low-fat dairy products, and low-fat meat, fish, and poultry.  Limit fats such as oils, salad dressings, butter, nuts, and avocado.  Keep a food diary to identify foods that cause symptoms.  Avoid foods that cause reflux. These may be different for different people.  Eat frequent small meals instead of three large meals each day.  Eat your meals slowly, in a relaxed setting.  Limit fried foods.  Cook foods using methods other than frying.  Avoid drinking alcohol.  Avoid drinking large amounts of liquids with your meals.  Avoid bending over or lying down until 2-3 hours after eating. WHAT FOODS ARE NOT RECOMMENDED? The following are some foods and drinks that may worsen your symptoms: Vegetables Tomatoes. Tomato juice. Tomato and spaghetti sauce. Chili peppers. Onion and garlic. Horseradish. Fruits Oranges, grapefruit, and lemon (fruit and juice). Meats High-fat meats, fish, and poultry. This includes hot dogs, ribs, ham, sausage, salami, and bacon. Dairy Whole milk and chocolate milk. Sour cream. Cream. Butter. Ice cream. Cream cheese.  Beverages Coffee and tea, with or without caffeine. Carbonated beverages or energy drinks. Condiments Hot sauce. Barbecue sauce.  Sweets/Desserts Chocolate and cocoa. Donuts. Peppermint and spearmint. Fats and Oils High-fat foods, including [place] fries and potato chips. Other Vinegar. Strong spices, such as black pepper, white pepper, red pepper, cayenne, curry powder, cloves, ginger, and chili powder. The items listed above may not be a complete list of foods and beverages to avoid.  Contact your dietitian for more information.   This information is not intended to replace advice given to you by your health care provider. Make sure you discuss any questions you have with your health care provider.   Document Released: 12/03/2005 Document Revised: 12/24/2014 Document Reviewed: 10/07/2013 Elsevier Interactive Patient Education [employer].

## 2015-11-01 NOTE — Addendum Note (Signed)
Addended by: Teddy Spike on: 11/01/2015 06:00 PM   Modules accepted: Orders

## 2015-11-14 DIAGNOSIS — L820 Inflamed seborrheic keratosis: Secondary | ICD-10-CM | POA: Diagnosis not present

## 2015-11-14 DIAGNOSIS — D225 Melanocytic nevi of trunk: Secondary | ICD-10-CM | POA: Diagnosis not present

## 2015-11-14 DIAGNOSIS — L821 Other seborrheic keratosis: Secondary | ICD-10-CM | POA: Diagnosis not present

## 2015-11-14 DIAGNOSIS — Z86018 Personal history of other benign neoplasm: Secondary | ICD-10-CM | POA: Diagnosis not present

## 2015-11-24 ENCOUNTER — Ambulatory Visit (INDEPENDENT_AMBULATORY_CARE_PROVIDER_SITE_OTHER): Payer: Medicare Other | Admitting: Osteopathic Medicine

## 2015-11-24 ENCOUNTER — Encounter: Payer: Self-pay | Admitting: Osteopathic Medicine

## 2015-11-24 VITALS — BP 145/85 | HR 73 | Temp 98.1°F | Ht 62.0 in | Wt 145.0 lb

## 2015-11-24 DIAGNOSIS — I10 Essential (primary) hypertension: Secondary | ICD-10-CM | POA: Diagnosis not present

## 2015-11-24 DIAGNOSIS — J069 Acute upper respiratory infection, unspecified: Secondary | ICD-10-CM | POA: Diagnosis not present

## 2015-11-24 DIAGNOSIS — E8809 Other disorders of plasma-protein metabolism, not elsewhere classified: Secondary | ICD-10-CM | POA: Diagnosis not present

## 2015-11-24 DIAGNOSIS — B9789 Other viral agents as the cause of diseases classified elsewhere: Principal | ICD-10-CM

## 2015-11-24 MED ORDER — BENZONATATE 200 MG PO CAPS: 200.0000 mg | ORAL_CAPSULE | Freq: Three times a day (TID) | ORAL | Status: DC | PRN

## 2015-11-24 MED ORDER — IPRATROPIUM BROMIDE 0.03 % NA SOLN: 2.0000 | Freq: Two times a day (BID) | NASAL | Status: DC

## 2015-11-24 NOTE — Patient Instructions (Signed)
If you are not feeling better by Monday, please call the office and at that time we can consider possibly giving antibiotics. In the meantime continue the over-the-counter medicine you are taking in addition to the prescription cough medicine and nasal spray from Dr. Sheppard Coil. If you're getting any worse, please let us know sooner or please go to urgent care over the weekends. Most likely this is a viral cold which will need to run its course, we will be treating the symptoms and preventing bacterial infection with the medications as listed above.
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
## 2015-11-25 ENCOUNTER — Telehealth: Payer: Self-pay

## 2015-11-25 LAB — PROTEIN, TOTAL: TOTAL PROTEIN: 6.6 g/dL (ref 6.1–8.1)

## 2015-11-25 NOTE — Telephone Encounter (Signed)
Left message informing patient that she should not need additional medication if her exam was clear.  If she feels like she is getting worse then she needs an appointment to further evaluate.

## 2015-11-25 NOTE — Telephone Encounter (Signed)
-----   Message from Hali Marry, MD sent at 11/25/2015  8:27 AM EST ----- Call pt: protein levels are back to noraml

## 2015-11-25 NOTE — Telephone Encounter (Signed)
Patient aware of results.

## 2015-12-16 DIAGNOSIS — M19042 Primary osteoarthritis, left hand: Secondary | ICD-10-CM | POA: Diagnosis not present

## 2015-12-16 DIAGNOSIS — M25532 Pain in left wrist: Secondary | ICD-10-CM | POA: Diagnosis not present

## 2016-01-14 ENCOUNTER — Other Ambulatory Visit: Payer: Self-pay | Admitting: Family Medicine

## 2016-01-16 DIAGNOSIS — M19042 Primary osteoarthritis, left hand: Secondary | ICD-10-CM | POA: Diagnosis not present

## 2016-01-16 DIAGNOSIS — M25532 Pain in left wrist: Secondary | ICD-10-CM | POA: Diagnosis not present

## 2016-01-23 ENCOUNTER — Other Ambulatory Visit: Payer: Self-pay | Admitting: Family Medicine

## 2016-01-26 ENCOUNTER — Other Ambulatory Visit: Payer: Self-pay | Admitting: Family Medicine

## 2016-02-06 ENCOUNTER — Other Ambulatory Visit: Payer: Self-pay | Admitting: Family Medicine

## 2016-02-14 DIAGNOSIS — M79644 Pain in right finger(s): Secondary | ICD-10-CM | POA: Diagnosis not present

## 2016-02-14 DIAGNOSIS — M19042 Primary osteoarthritis, left hand: Secondary | ICD-10-CM | POA: Diagnosis not present

## 2016-02-14 DIAGNOSIS — M25532 Pain in left wrist: Secondary | ICD-10-CM | POA: Diagnosis not present

## 2016-04-20 ENCOUNTER — Other Ambulatory Visit: Payer: Self-pay | Admitting: Family Medicine

## 2016-04-27 DIAGNOSIS — M25512 Pain in left shoulder: Secondary | ICD-10-CM | POA: Diagnosis not present

## 2016-05-03 ENCOUNTER — Ambulatory Visit (INDEPENDENT_AMBULATORY_CARE_PROVIDER_SITE_OTHER): Payer: Medicare Other | Admitting: Rehabilitative and Restorative Service Providers"

## 2016-05-03 ENCOUNTER — Encounter: Payer: Self-pay | Admitting: Rehabilitative and Restorative Service Providers"

## 2016-05-03 DIAGNOSIS — M25512 Pain in left shoulder: Secondary | ICD-10-CM

## 2016-05-03 DIAGNOSIS — R29898 Other symptoms and signs involving the musculoskeletal system: Secondary | ICD-10-CM | POA: Diagnosis not present

## 2016-05-03 DIAGNOSIS — R293 Abnormal posture: Secondary | ICD-10-CM

## 2016-05-03 NOTE — Patient Instructions (Signed)
Self massage using about a 4 inch rubber ball   Shoulder Blade Squeeze    Rotate shoulders back, then squeeze shoulder blades down and back  Hold 10 sec Repeat _10___ times. Do __several __ sessions per day. Can use noodle along spine   Pulley  10 sec hold x 10 rep   Work on posture!   TENS UNIT: This is helpful for muscle pain and spasm.   Search and Purchase a TENS 7000 2nd edition at www.tenspros.com. It should be less than $30.     TENS unit instructions: Do not shower or bathe with the unit on Turn the unit off before removing electrodes or batteries If the electrodes lose stickiness add a drop of water to the electrodes after they are disconnected from the unit and place on plastic sheet. If you continued to have difficulty, call the TENS unit company to purchase more electrodes. Do not apply lotion on the skin area prior to use. Make sure the skin is clean and dry as this will help prolong the life of the electrodes. After use, always check skin for unusual red areas, rash or other skin difficulties. If there are any skin problems, does not apply electrodes to the same area. Never remove the electrodes from the unit by pulling the wires. Do not use the TENS unit or electrodes other than as directed. Do not change electrode placement without consultating your therapist or physician. Keep 2 fingers with between each electrode.    Marland Kitchen
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
## 2016-05-04 ENCOUNTER — Other Ambulatory Visit: Payer: Self-pay | Admitting: Family Medicine

## 2016-05-07 ENCOUNTER — Ambulatory Visit (INDEPENDENT_AMBULATORY_CARE_PROVIDER_SITE_OTHER): Payer: Medicare Other | Admitting: Physical Therapy

## 2016-05-07 DIAGNOSIS — M25512 Pain in left shoulder: Secondary | ICD-10-CM

## 2016-05-07 DIAGNOSIS — R293 Abnormal posture: Secondary | ICD-10-CM | POA: Diagnosis not present

## 2016-05-07 DIAGNOSIS — R29898 Other symptoms and signs involving the musculoskeletal system: Secondary | ICD-10-CM | POA: Diagnosis not present
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
## 2016-05-07 NOTE — Patient Instructions (Addendum)
SHOULDER: External Rotation - Supine (Cane)    Hold cane with both hands. Rotate arm away from body. Keep elbow on floor and next to body. _10__ reps per set, __1-2_ sets per day, __7_ days per week Add towel to keep elbow at side. Cane Exercise: Flexion    Lie on back, holding cane above chest. Keeping arms as straight as possible, lower cane toward floor beyond head. Repeat _10___ times. Do _1-2___ sessions per day.  [email or web address]   External Rotation (Passive)    With elbow bent and forearm on table, palm down, bend forward at waist until a stretch is felt. Hold __15__ seconds. Repeat _10___ times. Do ____ sessions per day. Flexion (Passive)    Sitting upright, slide forearm forward along table, bending from the waist until a stretch is felt. Hold _15___ seconds. Repeat _10___ times. Do _1___ sessions per day.     [facility] [place] [address], [place] [address]  [phone] (office) [phone] (fax)

## 2016-05-09 ENCOUNTER — Ambulatory Visit: Payer: Medicare Other | Admitting: Rehabilitative and Restorative Service Providers"

## 2016-05-10 ENCOUNTER — Ambulatory Visit (INDEPENDENT_AMBULATORY_CARE_PROVIDER_SITE_OTHER): Payer: Medicare Other | Admitting: Rehabilitative and Restorative Service Providers"

## 2016-05-10 ENCOUNTER — Encounter: Payer: Self-pay | Admitting: Rehabilitative and Restorative Service Providers"

## 2016-05-10 DIAGNOSIS — R293 Abnormal posture: Secondary | ICD-10-CM | POA: Diagnosis not present

## 2016-05-10 DIAGNOSIS — M25512 Pain in left shoulder: Secondary | ICD-10-CM | POA: Diagnosis not present

## 2016-05-10 DIAGNOSIS — R29898 Other symptoms and signs involving the musculoskeletal system: Secondary | ICD-10-CM | POA: Diagnosis not present
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
## 2016-05-15 ENCOUNTER — Ambulatory Visit (INDEPENDENT_AMBULATORY_CARE_PROVIDER_SITE_OTHER): Payer: Medicare Other | Admitting: Physical Therapy

## 2016-05-15 DIAGNOSIS — M25512 Pain in left shoulder: Secondary | ICD-10-CM

## 2016-05-15 DIAGNOSIS — R293 Abnormal posture: Secondary | ICD-10-CM | POA: Diagnosis not present

## 2016-05-15 DIAGNOSIS — R29898 Other symptoms and signs involving the musculoskeletal system: Secondary | ICD-10-CM | POA: Diagnosis not present
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
## 2016-05-18 ENCOUNTER — Encounter: Payer: Self-pay | Admitting: Rehabilitative and Restorative Service Providers"

## 2016-05-18 ENCOUNTER — Ambulatory Visit (INDEPENDENT_AMBULATORY_CARE_PROVIDER_SITE_OTHER): Payer: Medicare Other | Admitting: Rehabilitative and Restorative Service Providers"

## 2016-05-18 DIAGNOSIS — M25512 Pain in left shoulder: Secondary | ICD-10-CM | POA: Diagnosis not present

## 2016-05-18 DIAGNOSIS — R29898 Other symptoms and signs involving the musculoskeletal system: Secondary | ICD-10-CM

## 2016-05-18 DIAGNOSIS — R293 Abnormal posture: Secondary | ICD-10-CM | POA: Diagnosis not present
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
## 2016-05-18 NOTE — Patient Instructions (Signed)
Internal Rotator Cuff Stretch, Standing    Stand holding club behind body, one arm above head, other arm bent behind back. With upper hand, pull gently upward. Hold __20-30_ seconds. Repeat _3__ times per session. Do _3-4__ sessions per day.    Internal Rotator Cuff Stretch, Standing (Passive)   Can use pillow case to loop around wrist to help pull with right arm  Stand and bring hand behind back, using other hand to assist. Hold _20-30__ seconds. Repeat __5_ times per session. Do _3-4__ sessions per day.    Resisted External Rotation: in Neutral - Bilateral   PALMS UP Sit or stand, tubing in both hands, elbows at sides, bent to 90, forearms forward. Pinch shoulder blades together and rotate forearms out. Keep elbows at sides. Repeat __10__ times per set. Do _2-3___ sets per session. Do _2-3___ sessions per day.   Low Row: Standing   Face anchor, feet shoulder width apart. Palms up, pull arms back, squeezing shoulder blades together. Repeat 10__ times per set. Do 2-3__ sets per session. Do 2-3__ sessions per week. Anchor Height: Waist   Strengthening: Resisted Extension   Hold tubing in right hand, arm forward. Pull arm back, elbow straight. Repeat _10___ times per set. Do 2-3____ sets per session. Do 2-3____ sessions per day.    Lat Stretch, Standing    Stand and place palms against wall, shoulder-width apart. Lean upper body forward and push lower back outward. Hold __30_ seconds. Repeat _3-5__ times per session. Do __2-3_ sessions per day.   ROM: External Rotation (Alternate)    Keep palm of left hand against door frame and elbow bent at 90. Turn body from fixed hand until stretch is felt. Keep elbow tucked in at side  Hold _30___ seconds. Repeat _3-5___ times per set.  Do __3-4__ sessions per day.

## 2016-05-22 ENCOUNTER — Encounter: Payer: Self-pay | Admitting: Rehabilitative and Restorative Service Providers"

## 2016-05-22 ENCOUNTER — Ambulatory Visit (INDEPENDENT_AMBULATORY_CARE_PROVIDER_SITE_OTHER): Payer: Medicare Other | Admitting: Rehabilitative and Restorative Service Providers"

## 2016-05-22 DIAGNOSIS — R293 Abnormal posture: Secondary | ICD-10-CM | POA: Diagnosis not present

## 2016-05-22 DIAGNOSIS — M25512 Pain in left shoulder: Secondary | ICD-10-CM | POA: Diagnosis not present

## 2016-05-22 DIAGNOSIS — R29898 Other symptoms and signs involving the musculoskeletal system: Secondary | ICD-10-CM
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
## 2016-05-24 ENCOUNTER — Ambulatory Visit (INDEPENDENT_AMBULATORY_CARE_PROVIDER_SITE_OTHER): Payer: Medicare Other | Admitting: Physical Therapy

## 2016-05-24 ENCOUNTER — Encounter: Payer: Self-pay | Admitting: Physical Therapy

## 2016-05-24 DIAGNOSIS — R29898 Other symptoms and signs involving the musculoskeletal system: Secondary | ICD-10-CM | POA: Diagnosis not present

## 2016-05-24 DIAGNOSIS — M25512 Pain in left shoulder: Secondary | ICD-10-CM | POA: Diagnosis not present

## 2016-05-24 DIAGNOSIS — R293 Abnormal posture: Secondary | ICD-10-CM
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
## 2016-05-24 NOTE — Patient Instructions (Signed)

## 2016-05-28 DIAGNOSIS — M25512 Pain in left shoulder: Secondary | ICD-10-CM | POA: Diagnosis not present

## 2016-05-28 DIAGNOSIS — M7502 Adhesive capsulitis of left shoulder: Secondary | ICD-10-CM | POA: Diagnosis not present

## 2016-05-29 ENCOUNTER — Ambulatory Visit (INDEPENDENT_AMBULATORY_CARE_PROVIDER_SITE_OTHER): Payer: Medicare Other | Admitting: Rehabilitative and Restorative Service Providers"

## 2016-05-29 ENCOUNTER — Encounter: Payer: Self-pay | Admitting: Rehabilitative and Restorative Service Providers"

## 2016-05-29 DIAGNOSIS — R29898 Other symptoms and signs involving the musculoskeletal system: Secondary | ICD-10-CM | POA: Diagnosis not present

## 2016-05-29 DIAGNOSIS — M25512 Pain in left shoulder: Secondary | ICD-10-CM

## 2016-05-29 DIAGNOSIS — R293 Abnormal posture: Secondary | ICD-10-CM | POA: Diagnosis not present
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
## 2016-06-04 ENCOUNTER — Ambulatory Visit (INDEPENDENT_AMBULATORY_CARE_PROVIDER_SITE_OTHER): Payer: Medicare Other | Admitting: Rehabilitative and Restorative Service Providers"

## 2016-06-04 ENCOUNTER — Encounter: Payer: Self-pay | Admitting: Rehabilitative and Restorative Service Providers"

## 2016-06-04 DIAGNOSIS — R293 Abnormal posture: Secondary | ICD-10-CM

## 2016-06-04 DIAGNOSIS — M25512 Pain in left shoulder: Secondary | ICD-10-CM

## 2016-06-04 DIAGNOSIS — R29898 Other symptoms and signs involving the musculoskeletal system: Secondary | ICD-10-CM | POA: Diagnosis not present
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
## 2016-06-06 ENCOUNTER — Encounter: Payer: Self-pay | Admitting: Rehabilitative and Restorative Service Providers"

## 2016-06-06 ENCOUNTER — Ambulatory Visit (INDEPENDENT_AMBULATORY_CARE_PROVIDER_SITE_OTHER): Payer: Medicare Other | Admitting: Rehabilitative and Restorative Service Providers"

## 2016-06-06 DIAGNOSIS — R29898 Other symptoms and signs involving the musculoskeletal system: Secondary | ICD-10-CM | POA: Diagnosis not present

## 2016-06-06 DIAGNOSIS — R293 Abnormal posture: Secondary | ICD-10-CM

## 2016-06-06 DIAGNOSIS — M25512 Pain in left shoulder: Secondary | ICD-10-CM

## 2016-06-06 NOTE — Patient Instructions (Signed)
External Rotator Cuff Stretch, Standing    Stand, palm against door frame and elbow bent at 90. Turn body away from fixed hand allowing shoulder to come forward. Hold _30-60__ seconds.  Repeat _3__ times per session. Do __3_ sessions per day.  ROM: External Rotation (Alternate)    Keep palm of left hand against door frame and elbow bent at 90. Turn body from fixed hand until stretch is felt. Hold 30-60___ seconds. Repeat ___3_ times per set.  Do _3-4___ sessions per day.   Resisted External Rotation: in Neutral - Bilateral   PALMS UP Sit or stand, tubing in both hands, elbows at sides, bent to 90, forearms forward. Pinch shoulder blades together and rotate forearms out. Keep elbows at sides. Repeat __10__ times per set. Do _2-3___ sets per session. Do _2-3___ sessions per day.   Low Row: Standing   Face anchor, feet shoulder width apart. Palms up, pull arms back, squeezing shoulder blades together. Repeat 10__ times per set. Do 2-3__ sets per session. Do 2-3__ sessions per week. Anchor Height: Waist     Strengthening: Resisted Extension   Hold tubing in right hand, arm forward. Pull arm back, elbow straight. Repeat _10___ times per set. Do 2-3____ sets per session. Do 2-3____ sessions per day.
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
## 2016-06-07 ENCOUNTER — Telehealth: Payer: Self-pay | Admitting: *Deleted

## 2016-06-07 DIAGNOSIS — I10 Essential (primary) hypertension: Secondary | ICD-10-CM

## 2016-06-07 NOTE — Telephone Encounter (Signed)
Called pt back and lvm informing her that she does not have to fast for these labs and that I would place these orders and fax them to the lab and she can go at her convenience. And that she should call back to schedule her f/u appt.Deborah Mccullough [place]

## 2016-06-07 NOTE — Telephone Encounter (Signed)
Pt called and lvm asking if she would need to have fasting labs done for her 6 mos f/u appt for bp.Deborah Mccullough [place]

## 2016-06-11 ENCOUNTER — Ambulatory Visit (INDEPENDENT_AMBULATORY_CARE_PROVIDER_SITE_OTHER): Payer: Medicare Other | Admitting: Rehabilitative and Restorative Service Providers"

## 2016-06-11 ENCOUNTER — Encounter: Payer: Self-pay | Admitting: Rehabilitative and Restorative Service Providers"

## 2016-06-11 DIAGNOSIS — M25512 Pain in left shoulder: Secondary | ICD-10-CM

## 2016-06-11 DIAGNOSIS — R29898 Other symptoms and signs involving the musculoskeletal system: Secondary | ICD-10-CM | POA: Diagnosis not present

## 2016-06-11 DIAGNOSIS — R293 Abnormal posture: Secondary | ICD-10-CM | POA: Diagnosis not present
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
## 2016-06-11 NOTE — Patient Instructions (Signed)
Progressive Resisted: Abduction (Standing)    Holding __3__ pound weights, raise arms out from sides. Repeat ___10_ times per set. Do __1-3__ sets per session. Do __1__ sessions per day.    Progressive Resisted: External Rotation (Side-Lying)    Holding _3___ pound weight, towel under arm, raise right forearm toward ceiling. Keep elbow bent and at side. Repeat __10__ times per set. Do __1-3__ sets per session. Do __1__ sessions per day.

## 2016-06-14 ENCOUNTER — Ambulatory Visit: Payer: Medicare Other | Admitting: Family Medicine

## 2016-06-15 ENCOUNTER — Ambulatory Visit (INDEPENDENT_AMBULATORY_CARE_PROVIDER_SITE_OTHER): Payer: Medicare Other | Admitting: Physical Therapy

## 2016-06-15 DIAGNOSIS — R29898 Other symptoms and signs involving the musculoskeletal system: Secondary | ICD-10-CM

## 2016-06-15 DIAGNOSIS — M25512 Pain in left shoulder: Secondary | ICD-10-CM

## 2016-06-15 DIAGNOSIS — R293 Abnormal posture: Secondary | ICD-10-CM | POA: Diagnosis not present
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
## 2016-06-18 ENCOUNTER — Encounter: Payer: Self-pay | Admitting: Rehabilitative and Restorative Service Providers"

## 2016-06-18 ENCOUNTER — Ambulatory Visit (INDEPENDENT_AMBULATORY_CARE_PROVIDER_SITE_OTHER): Payer: Medicare Other | Admitting: Rehabilitative and Restorative Service Providers"

## 2016-06-18 DIAGNOSIS — R293 Abnormal posture: Secondary | ICD-10-CM

## 2016-06-18 DIAGNOSIS — M25512 Pain in left shoulder: Secondary | ICD-10-CM

## 2016-06-18 DIAGNOSIS — R29898 Other symptoms and signs involving the musculoskeletal system: Secondary | ICD-10-CM | POA: Diagnosis not present

## 2016-06-18 NOTE — Patient Instructions (Signed)
Raise: Forward    Anchor tubing under feet in narrow stance. Use Lt arm only Thumb toward ceiling, raise arms in front to parallel. Repeat _10_ times per set. Do _1-3_ sets per session. Do _1_ sessions per day    Rotation: Internal (Single Arm)    Side toward anchor in shoulder width stance with elbow bent to 90, forearm away from body. Thumb up, pull arm across body keeping elbow bent. Repeat _10_ times per set. Repeat with other arm. Do _1-3_ sets per session. Do _1_ sessions per day Anchor Height: Waist    Rotation: External (Single Arm)    Side toward anchor in shoulder width stance with elbow bent to 90, arm across mid-section. Thumb up, pull arm away from body, keeping elbow bent. Repeat _10_ times per set. Repeat with other arm. Do _1-3_ sets per session. Do _1_ sessions per day
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
## 2016-06-21 ENCOUNTER — Ambulatory Visit (INDEPENDENT_AMBULATORY_CARE_PROVIDER_SITE_OTHER): Payer: Medicare Other | Admitting: Family Medicine

## 2016-06-21 ENCOUNTER — Encounter: Payer: Self-pay | Admitting: Physical Therapy

## 2016-06-21 ENCOUNTER — Ambulatory Visit (INDEPENDENT_AMBULATORY_CARE_PROVIDER_SITE_OTHER): Payer: Medicare Other | Admitting: Physical Therapy

## 2016-06-21 ENCOUNTER — Encounter: Payer: Self-pay | Admitting: Family Medicine

## 2016-06-21 VITALS — BP 132/68 | HR 69 | Wt 150.0 lb

## 2016-06-21 DIAGNOSIS — R293 Abnormal posture: Secondary | ICD-10-CM | POA: Diagnosis not present

## 2016-06-21 DIAGNOSIS — R29898 Other symptoms and signs involving the musculoskeletal system: Secondary | ICD-10-CM

## 2016-06-21 DIAGNOSIS — M25512 Pain in left shoulder: Secondary | ICD-10-CM

## 2016-06-21 DIAGNOSIS — K219 Gastro-esophageal reflux disease without esophagitis: Secondary | ICD-10-CM | POA: Diagnosis not present

## 2016-06-21 DIAGNOSIS — I10 Essential (primary) hypertension: Secondary | ICD-10-CM | POA: Diagnosis not present

## 2016-06-21 DIAGNOSIS — G4700 Insomnia, unspecified: Secondary | ICD-10-CM | POA: Diagnosis not present

## 2016-06-21 DIAGNOSIS — R21 Rash and other nonspecific skin eruption: Secondary | ICD-10-CM | POA: Diagnosis not present

## 2016-06-21 MED ORDER — TRIAMCINOLONE ACETONIDE 0.5 % EX CREA: 1.0000 "application " | TOPICAL_CREAM | Freq: Two times a day (BID) | CUTANEOUS | Status: DC
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
## 2016-06-21 NOTE — Progress Notes (Signed)
Subjective:    CC: HTN  HPI:  Hypertension- Pt denies chest pain, SOB, dizziness, or heart palpitations.  Taking meds as directed w/o problems.  Denies medication side effects.    She also complains of a rash on her arms. She did travel out of town and was using different soaps etc. She says it never itch though. She started using Neosporin on it more recently. While on vacation she was also using bug repellent as well as sunscreen. Though she says she's use that sunscreen before and did not have any palms with breaking her skin out. No fevers or chills or sweats. No new detergents, etc.   Insomnia-she is taking her Ambien daily. She says she usually takes a half of a tab.  GERD- she only uses her Prilosec as needed, not daily.  Past medical history, Surgical history, Family history not pertinant except as noted below, Social history, Allergies, and medications have been entered into the medical record, reviewed, and corrections made.   Review of Systems: No fevers, chills, night sweats, weight loss, chest pain, or shortness of breath.   Objective:    General: Well Developed, well nourished, and in no acute distress.  Neuro: Alert and oriented x3, extra-ocular muscles intact, sensation grossly intact.  HEENT: Normocephalic, atraumatic  Skin: Warm and dry. She has erythematous papules ranging from 2-10 mm in size. Some of them have a fine scale. No open wounds, etc. Only on her arms.   Cardiac: Regular rate and rhythm, no murmurs rubs or gallops, no lower extremity edema.  Respiratory: Clear to auscultation bilaterally. Not using accessory muscles, speaking in full sentences.   Impression and Recommendations:   Hypertension-Well controlled. Continue current regimen. Follow up in 6 months.    Insomnia-continue Ambien at half tab daily. Try to use more as needed instead of nightly if possible.  GERD-continue when necessary use of omeprazole. She may need to take more regularly if she is  using a lot of ibuprofen or NSAIDs for her shoulder which she is currently getting physical therapy for.  Rash- unclear etiology at this point. I did do a skin scraping is to rule out fungal elements. It's really not itchy. It certainly could be a reaction like a contact dermatitis to the bug spray or the sunscreen though it is little unusual but it's only on her forearms.

## 2016-06-21 NOTE — Patient Instructions (Signed)
Instructed husband in Lt shoulder [place] mobs, posterior glides and inferior glides.  He return demo'd with good technique.

## 2016-06-21 NOTE — Patient Instructions (Signed)
Stop the neosporin.

## 2016-06-21 NOTE — Addendum Note (Signed)
Addended by: Teddy Spike on: 06/21/2016 03:57 PM   Modules accepted: Orders, SmartSet

## 2016-06-25 ENCOUNTER — Ambulatory Visit (INDEPENDENT_AMBULATORY_CARE_PROVIDER_SITE_OTHER): Payer: Medicare Other | Admitting: Rehabilitative and Restorative Service Providers"

## 2016-06-25 ENCOUNTER — Encounter: Payer: Self-pay | Admitting: Rehabilitative and Restorative Service Providers"

## 2016-06-25 DIAGNOSIS — M25512 Pain in left shoulder: Secondary | ICD-10-CM

## 2016-06-25 DIAGNOSIS — R29898 Other symptoms and signs involving the musculoskeletal system: Secondary | ICD-10-CM | POA: Diagnosis not present

## 2016-06-25 DIAGNOSIS — R293 Abnormal posture: Secondary | ICD-10-CM | POA: Diagnosis not present

## 2016-06-25 LAB — FUNGAL STAIN
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
## 2016-06-26 NOTE — Addendum Note (Signed)
Addended by: Beatrice Lecher D on: 06/26/2016 08:01 AM   Modules accepted: Miquel Dunn

## 2016-06-29 ENCOUNTER — Encounter: Payer: Self-pay | Admitting: Rehabilitative and Restorative Service Providers"

## 2016-06-29 ENCOUNTER — Ambulatory Visit (INDEPENDENT_AMBULATORY_CARE_PROVIDER_SITE_OTHER): Payer: Medicare Other | Admitting: Rehabilitative and Restorative Service Providers"

## 2016-06-29 DIAGNOSIS — M25512 Pain in left shoulder: Secondary | ICD-10-CM | POA: Diagnosis present

## 2016-06-29 DIAGNOSIS — R293 Abnormal posture: Secondary | ICD-10-CM | POA: Diagnosis not present

## 2016-06-29 DIAGNOSIS — R29898 Other symptoms and signs involving the musculoskeletal system: Secondary | ICD-10-CM | POA: Diagnosis not present

## 2016-06-29 NOTE — Patient Instructions (Signed)
Flexors Stretch, Standing    Stand near wall and slide arm up, with palm facing away from wall, by leaning toward wall. Hold _60__ seconds.  Repeat _3__ times per session. Do __2_ sessions per day. As back allows
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
## 2016-07-03 DIAGNOSIS — M7502 Adhesive capsulitis of left shoulder: Secondary | ICD-10-CM | POA: Diagnosis not present

## 2016-07-04 ENCOUNTER — Ambulatory Visit (INDEPENDENT_AMBULATORY_CARE_PROVIDER_SITE_OTHER): Payer: Medicare Other | Admitting: Rehabilitative and Restorative Service Providers"

## 2016-07-04 ENCOUNTER — Encounter: Payer: Self-pay | Admitting: Rehabilitative and Restorative Service Providers"

## 2016-07-04 DIAGNOSIS — R29898 Other symptoms and signs involving the musculoskeletal system: Secondary | ICD-10-CM

## 2016-07-04 DIAGNOSIS — R293 Abnormal posture: Secondary | ICD-10-CM

## 2016-07-04 DIAGNOSIS — M25512 Pain in left shoulder: Secondary | ICD-10-CM
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
## 2016-07-06 ENCOUNTER — Ambulatory Visit (INDEPENDENT_AMBULATORY_CARE_PROVIDER_SITE_OTHER): Payer: Medicare Other | Admitting: Rehabilitative and Restorative Service Providers"

## 2016-07-06 ENCOUNTER — Encounter: Payer: Self-pay | Admitting: Rehabilitative and Restorative Service Providers"

## 2016-07-06 DIAGNOSIS — R29898 Other symptoms and signs involving the musculoskeletal system: Secondary | ICD-10-CM

## 2016-07-06 DIAGNOSIS — R293 Abnormal posture: Secondary | ICD-10-CM | POA: Diagnosis not present

## 2016-07-06 DIAGNOSIS — M25512 Pain in left shoulder: Secondary | ICD-10-CM | POA: Diagnosis present
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
## 2016-07-07 ENCOUNTER — Other Ambulatory Visit: Payer: Self-pay | Admitting: Family Medicine

## 2016-07-08 ENCOUNTER — Other Ambulatory Visit: Payer: Self-pay | Admitting: Family Medicine

## 2016-07-09 ENCOUNTER — Other Ambulatory Visit: Payer: Self-pay | Admitting: Family Medicine

## 2016-07-10 ENCOUNTER — Encounter: Payer: Self-pay | Admitting: Rehabilitative and Restorative Service Providers"

## 2016-07-10 ENCOUNTER — Ambulatory Visit (INDEPENDENT_AMBULATORY_CARE_PROVIDER_SITE_OTHER): Payer: Medicare Other | Admitting: Rehabilitative and Restorative Service Providers"

## 2016-07-10 DIAGNOSIS — R29898 Other symptoms and signs involving the musculoskeletal system: Secondary | ICD-10-CM

## 2016-07-10 DIAGNOSIS — R293 Abnormal posture: Secondary | ICD-10-CM

## 2016-07-10 DIAGNOSIS — M25512 Pain in left shoulder: Secondary | ICD-10-CM | POA: Diagnosis present
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
## 2016-07-13 ENCOUNTER — Ambulatory Visit (INDEPENDENT_AMBULATORY_CARE_PROVIDER_SITE_OTHER): Payer: Medicare Other | Admitting: Rehabilitative and Restorative Service Providers"

## 2016-07-13 ENCOUNTER — Encounter: Payer: Self-pay | Admitting: Rehabilitative and Restorative Service Providers"

## 2016-07-13 DIAGNOSIS — M25512 Pain in left shoulder: Secondary | ICD-10-CM

## 2016-07-13 DIAGNOSIS — R29898 Other symptoms and signs involving the musculoskeletal system: Secondary | ICD-10-CM

## 2016-07-13 DIAGNOSIS — R293 Abnormal posture: Secondary | ICD-10-CM
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
## 2016-07-17 DIAGNOSIS — M19032 Primary osteoarthritis, left wrist: Secondary | ICD-10-CM | POA: Insufficient documentation

## 2016-07-18 ENCOUNTER — Encounter: Payer: Self-pay | Admitting: Rehabilitative and Restorative Service Providers"

## 2016-07-18 ENCOUNTER — Ambulatory Visit (INDEPENDENT_AMBULATORY_CARE_PROVIDER_SITE_OTHER): Payer: Medicare Other | Admitting: Rehabilitative and Restorative Service Providers"

## 2016-07-18 DIAGNOSIS — M25512 Pain in left shoulder: Secondary | ICD-10-CM

## 2016-07-18 DIAGNOSIS — R29898 Other symptoms and signs involving the musculoskeletal system: Secondary | ICD-10-CM | POA: Diagnosis not present

## 2016-07-18 DIAGNOSIS — R293 Abnormal posture: Secondary | ICD-10-CM | POA: Diagnosis not present
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
## 2016-07-20 ENCOUNTER — Encounter: Payer: Self-pay | Admitting: Rehabilitative and Restorative Service Providers"

## 2016-07-20 ENCOUNTER — Other Ambulatory Visit: Payer: Self-pay | Admitting: Family Medicine

## 2016-07-20 ENCOUNTER — Ambulatory Visit (INDEPENDENT_AMBULATORY_CARE_PROVIDER_SITE_OTHER): Payer: Medicare Other | Admitting: Rehabilitative and Restorative Service Providers"

## 2016-07-20 DIAGNOSIS — M25512 Pain in left shoulder: Secondary | ICD-10-CM | POA: Diagnosis present

## 2016-07-20 DIAGNOSIS — R293 Abnormal posture: Secondary | ICD-10-CM

## 2016-07-20 DIAGNOSIS — R29898 Other symptoms and signs involving the musculoskeletal system: Secondary | ICD-10-CM | POA: Diagnosis not present
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
## 2016-07-24 ENCOUNTER — Ambulatory Visit (INDEPENDENT_AMBULATORY_CARE_PROVIDER_SITE_OTHER): Payer: Medicare Other | Admitting: Rehabilitative and Restorative Service Providers"

## 2016-07-24 ENCOUNTER — Encounter: Payer: Self-pay | Admitting: Rehabilitative and Restorative Service Providers"

## 2016-07-24 DIAGNOSIS — R293 Abnormal posture: Secondary | ICD-10-CM

## 2016-07-24 DIAGNOSIS — R29898 Other symptoms and signs involving the musculoskeletal system: Secondary | ICD-10-CM | POA: Diagnosis not present

## 2016-07-24 DIAGNOSIS — M25512 Pain in left shoulder: Secondary | ICD-10-CM | POA: Diagnosis present
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
## 2016-07-27 ENCOUNTER — Encounter: Payer: Self-pay | Admitting: Rehabilitative and Restorative Service Providers"

## 2016-07-27 ENCOUNTER — Ambulatory Visit (INDEPENDENT_AMBULATORY_CARE_PROVIDER_SITE_OTHER): Payer: Medicare Other | Admitting: Rehabilitative and Restorative Service Providers"

## 2016-07-27 DIAGNOSIS — M25512 Pain in left shoulder: Secondary | ICD-10-CM | POA: Diagnosis present

## 2016-07-27 DIAGNOSIS — R293 Abnormal posture: Secondary | ICD-10-CM | POA: Diagnosis not present

## 2016-07-27 DIAGNOSIS — R29898 Other symptoms and signs involving the musculoskeletal system: Secondary | ICD-10-CM
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
## 2016-07-31 DIAGNOSIS — M25512 Pain in left shoulder: Secondary | ICD-10-CM | POA: Diagnosis not present

## 2016-07-31 DIAGNOSIS — M7502 Adhesive capsulitis of left shoulder: Secondary | ICD-10-CM | POA: Diagnosis not present

## 2016-08-01 ENCOUNTER — Ambulatory Visit (INDEPENDENT_AMBULATORY_CARE_PROVIDER_SITE_OTHER): Payer: Medicare Other | Admitting: Rehabilitative and Restorative Service Providers"

## 2016-08-01 ENCOUNTER — Encounter: Payer: Self-pay | Admitting: Rehabilitative and Restorative Service Providers"

## 2016-08-01 DIAGNOSIS — R29898 Other symptoms and signs involving the musculoskeletal system: Secondary | ICD-10-CM | POA: Diagnosis not present

## 2016-08-01 DIAGNOSIS — R293 Abnormal posture: Secondary | ICD-10-CM

## 2016-08-01 DIAGNOSIS — M25512 Pain in left shoulder: Secondary | ICD-10-CM | POA: Diagnosis not present
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
## 2016-08-03 ENCOUNTER — Encounter: Payer: Medicare Other | Admitting: Rehabilitative and Restorative Service Providers"

## 2016-08-06 ENCOUNTER — Ambulatory Visit: Payer: Medicare Other | Admitting: Family Medicine

## 2016-08-08 ENCOUNTER — Encounter: Payer: Self-pay | Admitting: Rehabilitative and Restorative Service Providers"

## 2016-08-08 ENCOUNTER — Ambulatory Visit (INDEPENDENT_AMBULATORY_CARE_PROVIDER_SITE_OTHER): Payer: Medicare Other | Admitting: Rehabilitative and Restorative Service Providers"

## 2016-08-08 DIAGNOSIS — R29898 Other symptoms and signs involving the musculoskeletal system: Secondary | ICD-10-CM

## 2016-08-08 DIAGNOSIS — M25512 Pain in left shoulder: Secondary | ICD-10-CM | POA: Diagnosis present

## 2016-08-08 DIAGNOSIS — R293 Abnormal posture: Secondary | ICD-10-CM | POA: Diagnosis not present
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
## 2016-08-09 ENCOUNTER — Telehealth: Payer: Self-pay | Admitting: Family Medicine

## 2016-08-09 DIAGNOSIS — R21 Rash and other nonspecific skin eruption: Secondary | ICD-10-CM

## 2016-08-09 NOTE — Telephone Encounter (Signed)
Pt called and stated she used the triamcinolone cream for two weeks on her arms  and it slightly helped but it has come back and pt wants to know if there is anything else she can do to help it or if you would recommend her coming back in for an apt. Thanks

## 2016-08-10 NOTE — Telephone Encounter (Signed)
Lets schedule her for biopsy next week or can refer to dermatology since not responding to steroid cream.

## 2016-08-13 NOTE — Telephone Encounter (Signed)
Left message advising patient to return call

## 2016-08-13 NOTE — Telephone Encounter (Signed)
See note below about the triamcinolone cream. In addition she has been on hormone replacement therapy for at least 5 years now. It is really time to start weaning off the medication or consider switching to something that is nonhormonal to treat her symptoms.

## 2016-08-13 NOTE — Telephone Encounter (Signed)
Patient agreed to go to the dermatologist. She has a dermatologist. Sent referral.   Crista Luria M MD Address: [address] [place], [place] [address] Phone: [phone]

## 2016-08-14 NOTE — Telephone Encounter (Signed)
Left a message advising of appointment with Dr Tonia Brooms on September the 19 th at 9:15 am.

## 2016-08-15 ENCOUNTER — Encounter: Payer: Self-pay | Admitting: Rehabilitative and Restorative Service Providers"

## 2016-08-15 ENCOUNTER — Ambulatory Visit (INDEPENDENT_AMBULATORY_CARE_PROVIDER_SITE_OTHER): Payer: Medicare Other | Admitting: Rehabilitative and Restorative Service Providers"

## 2016-08-15 DIAGNOSIS — R29898 Other symptoms and signs involving the musculoskeletal system: Secondary | ICD-10-CM | POA: Diagnosis not present

## 2016-08-15 DIAGNOSIS — R293 Abnormal posture: Secondary | ICD-10-CM | POA: Diagnosis not present

## 2016-08-15 DIAGNOSIS — M25512 Pain in left shoulder: Secondary | ICD-10-CM | POA: Diagnosis present

## 2016-08-15 NOTE — Telephone Encounter (Signed)
Patient advised of appointment.  
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
## 2016-08-17 DIAGNOSIS — D485 Neoplasm of uncertain behavior of skin: Secondary | ICD-10-CM | POA: Diagnosis not present

## 2016-08-21 DIAGNOSIS — L570 Actinic keratosis: Secondary | ICD-10-CM | POA: Diagnosis not present

## 2016-08-22 ENCOUNTER — Encounter: Payer: Self-pay | Admitting: Rehabilitative and Restorative Service Providers"

## 2016-08-22 ENCOUNTER — Ambulatory Visit (INDEPENDENT_AMBULATORY_CARE_PROVIDER_SITE_OTHER): Payer: Medicare Other | Admitting: Rehabilitative and Restorative Service Providers"

## 2016-08-22 DIAGNOSIS — R293 Abnormal posture: Secondary | ICD-10-CM

## 2016-08-22 DIAGNOSIS — M25512 Pain in left shoulder: Secondary | ICD-10-CM

## 2016-08-22 DIAGNOSIS — R29898 Other symptoms and signs involving the musculoskeletal system: Secondary | ICD-10-CM
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
## 2016-09-04 ENCOUNTER — Ambulatory Visit (INDEPENDENT_AMBULATORY_CARE_PROVIDER_SITE_OTHER): Payer: Medicare Other | Admitting: Rehabilitative and Restorative Service Providers"

## 2016-09-04 ENCOUNTER — Encounter: Payer: Self-pay | Admitting: Rehabilitative and Restorative Service Providers"

## 2016-09-04 DIAGNOSIS — M25512 Pain in left shoulder: Secondary | ICD-10-CM | POA: Diagnosis present

## 2016-09-04 DIAGNOSIS — R29898 Other symptoms and signs involving the musculoskeletal system: Secondary | ICD-10-CM | POA: Diagnosis not present

## 2016-09-04 DIAGNOSIS — R293 Abnormal posture: Secondary | ICD-10-CM | POA: Diagnosis not present
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
## 2016-09-10 DIAGNOSIS — M25512 Pain in left shoulder: Secondary | ICD-10-CM | POA: Diagnosis not present

## 2016-09-10 DIAGNOSIS — M7502 Adhesive capsulitis of left shoulder: Secondary | ICD-10-CM | POA: Diagnosis not present

## 2016-09-25 ENCOUNTER — Other Ambulatory Visit: Payer: Self-pay | Admitting: Family Medicine

## 2016-09-25 DIAGNOSIS — Z1231 Encounter for screening mammogram for malignant neoplasm of breast: Secondary | ICD-10-CM

## 2016-10-08 ENCOUNTER — Ambulatory Visit
Admission: RE | Admit: 2016-10-08 | Discharge: 2016-10-08 | Disposition: A | Discharge status: Home / Self Care | Payer: Medicare Other | Source: Ambulatory Visit | Attending: Family Medicine | Admitting: Family Medicine

## 2016-10-08 DIAGNOSIS — Z1231 Encounter for screening mammogram for malignant neoplasm of breast: Secondary | ICD-10-CM

## 2016-10-17 DIAGNOSIS — H527 Unspecified disorder of refraction: Secondary | ICD-10-CM | POA: Diagnosis not present

## 2016-10-17 DIAGNOSIS — H2513 Age-related nuclear cataract, bilateral: Secondary | ICD-10-CM | POA: Diagnosis not present

## 2016-10-28 ENCOUNTER — Other Ambulatory Visit: Payer: Self-pay | Admitting: Family Medicine

## 2016-10-31 ENCOUNTER — Ambulatory Visit (INDEPENDENT_AMBULATORY_CARE_PROVIDER_SITE_OTHER): Payer: Medicare Other | Admitting: Family Medicine

## 2016-10-31 VITALS — BP 142/80 | HR 63 | Temp 98.1°F

## 2016-10-31 DIAGNOSIS — Z23 Encounter for immunization: Secondary | ICD-10-CM | POA: Diagnosis not present

## 2016-10-31 NOTE — Progress Notes (Signed)
Patient came into clinic today for flu vaccination. Patient tolerated injection of flu immunization in left deltoid well, with no immediate complications. Advised to contact our office with any questions/concerns.

## 2016-11-14 ENCOUNTER — Other Ambulatory Visit: Payer: Self-pay | Admitting: Family Medicine

## 2016-11-15 ENCOUNTER — Other Ambulatory Visit: Payer: Self-pay | Admitting: Family Medicine

## 2016-12-24 ENCOUNTER — Other Ambulatory Visit (HOSPITAL_COMMUNITY)
Admission: RE | Admit: 2016-12-24 | Discharge: 2016-12-24 | Disposition: A | Discharge status: Home / Self Care | Payer: Medicare Other | Source: Ambulatory Visit | Attending: Family Medicine | Admitting: Family Medicine

## 2016-12-24 ENCOUNTER — Encounter: Payer: Self-pay | Admitting: Family Medicine

## 2016-12-24 ENCOUNTER — Ambulatory Visit (INDEPENDENT_AMBULATORY_CARE_PROVIDER_SITE_OTHER): Payer: Medicare Other | Admitting: Family Medicine

## 2016-12-24 VITALS — BP 138/61 | HR 74 | Ht 62.0 in | Wt 145.0 lb

## 2016-12-24 DIAGNOSIS — Z01419 Encounter for gynecological examination (general) (routine) without abnormal findings: Secondary | ICD-10-CM | POA: Diagnosis not present

## 2016-12-24 DIAGNOSIS — Z1151 Encounter for screening for human papillomavirus (HPV): Secondary | ICD-10-CM | POA: Insufficient documentation

## 2016-12-24 DIAGNOSIS — Z23 Encounter for immunization: Secondary | ICD-10-CM | POA: Diagnosis not present

## 2016-12-24 DIAGNOSIS — Z124 Encounter for screening for malignant neoplasm of cervix: Secondary | ICD-10-CM

## 2016-12-24 DIAGNOSIS — I10 Essential (primary) hypertension: Secondary | ICD-10-CM

## 2016-12-24 DIAGNOSIS — Z0000 Encounter for general adult medical examination without abnormal findings: Secondary | ICD-10-CM

## 2016-12-24 LAB — LIPID PANEL
CHOL/HDL RATIO: 2.1 ratio (ref <5.0)
Cholesterol: 149 mg/dL (ref <200)
HDL: 70 mg/dL (ref >50)
LDL CALC: 61 mg/dL (ref <100)
Triglycerides: 92 mg/dL (ref <150)
VLDL: 18 mg/dL (ref <30)

## 2016-12-24 LAB — COMPLETE METABOLIC PANEL WITH GFR
ALT: 41 U/L — ABNORMAL HIGH (ref 6–29)
AST: 42 U/L — ABNORMAL HIGH (ref 10–35)
Albumin: 4 g/dL (ref 3.6–5.1)
Alkaline Phosphatase: 44 U/L (ref 33–130)
BUN: 17 mg/dL (ref 7–25)
CALCIUM: 8.9 mg/dL (ref 8.6–10.4)
CHLORIDE: 107 mmol/L (ref 98–110)
CO2: 25 mmol/L (ref 20–31)
Creat: 0.89 mg/dL (ref 0.50–0.99)
GFR, Est African American: 78 mL/min (ref >=60)
GFR, Est Non African American: 67 mL/min (ref >=60)
Glucose, Bld: 87 mg/dL (ref 65–99)
POTASSIUM: 4.1 mmol/L (ref 3.5–5.3)
SODIUM: 141 mmol/L (ref 135–146)
Total Bilirubin: 0.6 mg/dL (ref 0.2–1.2)
Total Protein: 7 g/dL (ref 6.1–8.1)
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
## 2016-12-25 ENCOUNTER — Other Ambulatory Visit: Payer: Self-pay | Admitting: Family Medicine

## 2016-12-25 DIAGNOSIS — R748 Abnormal levels of other serum enzymes: Secondary | ICD-10-CM

## 2016-12-26 LAB — CYTOLOGY - PAP
Diagnosis: NEGATIVE
HPV: NOT DETECTED

## 2016-12-26 NOTE — Progress Notes (Signed)
Call patient: Your Pap smear is normal. Repeat in 5 years if she wants to but no need to repeat in future unless new sexual partner.

## 2016-12-28 ENCOUNTER — Other Ambulatory Visit: Payer: Self-pay | Admitting: Family Medicine

## 2017-01-14 ENCOUNTER — Ambulatory Visit (INDEPENDENT_AMBULATORY_CARE_PROVIDER_SITE_OTHER): Payer: Medicare Other

## 2017-01-14 ENCOUNTER — Encounter: Payer: Self-pay | Admitting: Family Medicine

## 2017-01-14 ENCOUNTER — Ambulatory Visit (INDEPENDENT_AMBULATORY_CARE_PROVIDER_SITE_OTHER): Payer: Medicare Other | Admitting: Family Medicine

## 2017-01-14 VITALS — BP 153/56 | HR 66 | Ht 62.0 in | Wt 147.0 lb

## 2017-01-14 DIAGNOSIS — R1013 Epigastric pain: Secondary | ICD-10-CM | POA: Diagnosis not present

## 2017-01-14 DIAGNOSIS — R7989 Other specified abnormal findings of blood chemistry: Secondary | ICD-10-CM

## 2017-01-14 DIAGNOSIS — R945 Abnormal results of liver function studies: Secondary | ICD-10-CM

## 2017-01-14 DIAGNOSIS — R0602 Shortness of breath: Secondary | ICD-10-CM | POA: Diagnosis not present
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
## 2017-01-15 ENCOUNTER — Other Ambulatory Visit: Payer: Self-pay | Admitting: *Deleted

## 2017-01-15 DIAGNOSIS — R945 Abnormal results of liver function studies: Principal | ICD-10-CM

## 2017-01-15 DIAGNOSIS — R7989 Other specified abnormal findings of blood chemistry: Secondary | ICD-10-CM

## 2017-01-15 LAB — COMPLETE METABOLIC PANEL WITH GFR
ALT: 34 U/L — AB (ref 6–29)
AST: 36 U/L — ABNORMAL HIGH (ref 10–35)
Albumin: 4.1 g/dL (ref 3.6–5.1)
Alkaline Phosphatase: 45 U/L (ref 33–130)
BUN: 14 mg/dL (ref 7–25)
CALCIUM: 9 mg/dL (ref 8.6–10.4)
CHLORIDE: 106 mmol/L (ref 98–110)
CO2: 26 mmol/L (ref 20–31)
Creat: 0.84 mg/dL (ref 0.50–0.99)
GFR, EST AFRICAN AMERICAN: 83 mL/min (ref >=60)
GFR, Est Non African American: 72 mL/min (ref >=60)
Glucose, Bld: 77 mg/dL (ref 65–99)
POTASSIUM: 4 mmol/L (ref 3.5–5.3)
Sodium: 141 mmol/L (ref 135–146)
Total Bilirubin: 0.4 mg/dL (ref 0.2–1.2)
Total Protein: 7 g/dL (ref 6.1–8.1)

## 2017-01-15 LAB — CBC WITH DIFFERENTIAL/PLATELET
BASOS PCT: 0 %
Basophils Absolute: 0 cells/uL (ref 0–200)
EOS ABS: 51 {cells}/uL (ref 15–500)
Eosinophils Relative: 1 %
HEMATOCRIT: 40.4 % (ref 35.0–45.0)
HEMOGLOBIN: 13.6 g/dL (ref 11.7–15.5)
LYMPHS ABS: 1683 {cells}/uL (ref 850–3900)
Lymphocytes Relative: 33 %
MCH: 31.6 pg (ref 27.0–33.0)
MCHC: 33.7 g/dL (ref 32.0–36.0)
MCV: 93.7 fL (ref 80.0–100.0)
MONO ABS: 459 {cells}/uL (ref 200–950)
MPV: 9.1 fL (ref 7.5–12.5)
Monocytes Relative: 9 %
NEUTROS ABS: 2907 {cells}/uL (ref 1500–7800)
Neutrophils Relative %: 57 %
Platelets: 215 10*3/uL (ref 140–400)
RBC: 4.31 MIL/uL (ref 3.80–5.10)
RDW: 12.8 % (ref 11.0–15.0)
WBC: 5.1 10*3/uL (ref 3.8–10.8)

## 2017-01-15 LAB — LIPASE: Lipase: 34 U/L (ref 7–60)

## 2017-01-15 LAB — AMYLASE: Amylase: 60 U/L (ref 0–105)

## 2017-01-15 NOTE — Addendum Note (Signed)
Addended by: Teddy Spike on: 01/15/2017 05:45 PM   Modules accepted: Orders

## 2017-01-17 DIAGNOSIS — D1801 Hemangioma of skin and subcutaneous tissue: Secondary | ICD-10-CM | POA: Diagnosis not present

## 2017-01-17 DIAGNOSIS — L814 Other melanin hyperpigmentation: Secondary | ICD-10-CM | POA: Diagnosis not present

## 2017-01-17 DIAGNOSIS — L821 Other seborrheic keratosis: Secondary | ICD-10-CM | POA: Diagnosis not present

## 2017-01-17 DIAGNOSIS — Z86018 Personal history of other benign neoplasm: Secondary | ICD-10-CM | POA: Diagnosis not present

## 2017-01-17 DIAGNOSIS — L570 Actinic keratosis: Secondary | ICD-10-CM | POA: Diagnosis not present

## 2017-01-17 DIAGNOSIS — Z23 Encounter for immunization: Secondary | ICD-10-CM | POA: Diagnosis not present

## 2017-01-17 DIAGNOSIS — D225 Melanocytic nevi of trunk: Secondary | ICD-10-CM | POA: Diagnosis not present

## 2017-01-17 DIAGNOSIS — D229 Melanocytic nevi, unspecified: Secondary | ICD-10-CM | POA: Diagnosis not present

## 2017-01-18 ENCOUNTER — Other Ambulatory Visit: Payer: Self-pay | Admitting: Family Medicine

## 2017-01-24 ENCOUNTER — Other Ambulatory Visit: Payer: Self-pay | Admitting: Family Medicine

## 2017-01-26 ENCOUNTER — Other Ambulatory Visit: Payer: Self-pay | Admitting: Family Medicine

## 2017-02-04 ENCOUNTER — Encounter: Payer: Self-pay | Admitting: Family Medicine

## 2017-02-05 ENCOUNTER — Telehealth: Payer: Self-pay | Admitting: *Deleted

## 2017-02-05 NOTE — Telephone Encounter (Signed)
Pre Authorization sent to cover my meds. [id number]

## 2017-02-13 NOTE — Telephone Encounter (Signed)
Estradiol approved after appeal sent. Approval is good until 12/16/2017  Message left on patient mobile and pharmacy vm

## 2017-02-18 ENCOUNTER — Encounter: Payer: Self-pay | Admitting: Family Medicine

## 2017-03-16 ENCOUNTER — Other Ambulatory Visit: Payer: Self-pay | Admitting: Family Medicine

## 2017-03-19 DIAGNOSIS — R7989 Other specified abnormal findings of blood chemistry: Secondary | ICD-10-CM | POA: Diagnosis not present

## 2017-03-19 LAB — HEPATIC FUNCTION PANEL
ALT: 60 U/L — ABNORMAL HIGH (ref 6–29)
AST: 54 U/L — ABNORMAL HIGH (ref 10–35)
Albumin: 4.1 g/dL (ref 3.6–5.1)
Alkaline Phosphatase: 46 U/L (ref 33–130)
BILIRUBIN DIRECT: 0.2 mg/dL (ref <=0.2)
BILIRUBIN INDIRECT: 0.4 mg/dL (ref 0.2–1.2)
BILIRUBIN TOTAL: 0.6 mg/dL (ref 0.2–1.2)
Total Protein: 7 g/dL (ref 6.1–8.1)

## 2017-03-20 ENCOUNTER — Other Ambulatory Visit: Payer: Self-pay | Admitting: *Deleted

## 2017-03-20 DIAGNOSIS — R748 Abnormal levels of other serum enzymes: Secondary | ICD-10-CM

## 2017-03-26 ENCOUNTER — Other Ambulatory Visit: Payer: Medicare Other

## 2017-03-27 ENCOUNTER — Ambulatory Visit (INDEPENDENT_AMBULATORY_CARE_PROVIDER_SITE_OTHER): Payer: Medicare Other

## 2017-03-27 DIAGNOSIS — E785 Hyperlipidemia, unspecified: Secondary | ICD-10-CM

## 2017-03-27 DIAGNOSIS — R945 Abnormal results of liver function studies: Secondary | ICD-10-CM | POA: Diagnosis not present

## 2017-03-27 DIAGNOSIS — R748 Abnormal levels of other serum enzymes: Secondary | ICD-10-CM

## 2017-04-18 ENCOUNTER — Encounter: Payer: Self-pay | Admitting: Family Medicine

## 2017-04-22 DIAGNOSIS — R748 Abnormal levels of other serum enzymes: Secondary | ICD-10-CM | POA: Diagnosis not present

## 2017-04-23 LAB — HEPATIC FUNCTION PANEL
ALK PHOS: 43 U/L (ref 33–130)
ALT: 21 U/L (ref 6–29)
AST: 27 U/L (ref 10–35)
Albumin: 3.8 g/dL (ref 3.6–5.1)
Bilirubin, Direct: 0.1 mg/dL (ref <=0.2)
Indirect Bilirubin: 0.3 mg/dL (ref 0.2–1.2)
Total Bilirubin: 0.4 mg/dL (ref 0.2–1.2)
Total Protein: 6.2 g/dL (ref 6.1–8.1)

## 2017-04-24 ENCOUNTER — Other Ambulatory Visit: Payer: Self-pay | Admitting: *Deleted

## 2017-04-24 DIAGNOSIS — E7800 Pure hypercholesterolemia, unspecified: Secondary | ICD-10-CM

## 2017-04-27 ENCOUNTER — Other Ambulatory Visit: Payer: Self-pay | Admitting: Family Medicine

## 2017-04-29 ENCOUNTER — Other Ambulatory Visit: Payer: Self-pay | Admitting: Family Medicine

## 2017-05-12 ENCOUNTER — Other Ambulatory Visit: Payer: Self-pay | Admitting: Family Medicine

## 2017-05-17 ENCOUNTER — Encounter: Payer: Self-pay | Admitting: Family Medicine

## 2017-05-22 ENCOUNTER — Encounter: Payer: Self-pay | Admitting: Family Medicine

## 2017-05-23 NOTE — Telephone Encounter (Signed)
Okay for let her for jury duty. Please use the template under letters.

## 2017-06-06 ENCOUNTER — Encounter: Payer: Self-pay | Admitting: Family Medicine

## 2017-06-26 ENCOUNTER — Encounter: Payer: Self-pay | Admitting: Family Medicine

## 2017-06-26 ENCOUNTER — Telehealth: Payer: Self-pay | Admitting: *Deleted

## 2017-06-26 DIAGNOSIS — R748 Abnormal levels of other serum enzymes: Secondary | ICD-10-CM

## 2017-06-26 NOTE — Telephone Encounter (Signed)
Labs ordered.

## 2017-06-27 DIAGNOSIS — E7800 Pure hypercholesterolemia, unspecified: Secondary | ICD-10-CM | POA: Diagnosis not present

## 2017-06-27 LAB — HEPATIC FUNCTION PANEL
ALK PHOS: 47 U/L (ref 33–130)
ALT: 36 U/L — AB (ref 6–29)
AST: 34 U/L (ref 10–35)
Albumin: 4 g/dL (ref 3.6–5.1)
BILIRUBIN DIRECT: 0.1 mg/dL (ref <=0.2)
BILIRUBIN TOTAL: 0.5 mg/dL (ref 0.2–1.2)
Indirect Bilirubin: 0.4 mg/dL (ref 0.2–1.2)
Total Protein: 6.5 g/dL (ref 6.1–8.1)

## 2017-06-28 LAB — LIPID PANEL W/REFLEX DIRECT LDL
CHOL/HDL RATIO: 2.9 ratio (ref <5.0)
Cholesterol: 170 mg/dL (ref <200)
HDL: 58 mg/dL (ref >50)
LDL-CHOLESTEROL: 94 mg/dL
NON-HDL CHOLESTEROL (CALC): 112 mg/dL (ref <130)
Triglycerides: 88 mg/dL (ref <150)

## 2017-07-11 ENCOUNTER — Other Ambulatory Visit: Payer: Self-pay | Admitting: Family Medicine

## 2017-07-21 ENCOUNTER — Other Ambulatory Visit: Payer: Self-pay | Admitting: Family Medicine

## 2017-07-24 ENCOUNTER — Other Ambulatory Visit: Payer: Self-pay | Admitting: Family Medicine

## 2017-08-16 ENCOUNTER — Other Ambulatory Visit: Payer: Self-pay | Admitting: Family Medicine

## 2017-08-21 ENCOUNTER — Other Ambulatory Visit: Payer: Self-pay | Admitting: Family Medicine

## 2017-08-22 ENCOUNTER — Other Ambulatory Visit: Payer: Self-pay | Admitting: Family Medicine

## 2017-08-28 ENCOUNTER — Other Ambulatory Visit: Payer: Self-pay | Admitting: Family Medicine

## 2017-08-29 ENCOUNTER — Telehealth: Payer: Self-pay | Admitting: Family Medicine

## 2017-08-29 NOTE — Telephone Encounter (Signed)
Left a message for her to call our office because she is due for a F/u app with Dr.Metheney on her BP and Cholesterol

## 2017-09-04 ENCOUNTER — Telehealth: Payer: Self-pay | Admitting: Family Medicine

## 2017-09-04 DIAGNOSIS — R748 Abnormal levels of other serum enzymes: Secondary | ICD-10-CM

## 2017-09-04 DIAGNOSIS — I10 Essential (primary) hypertension: Secondary | ICD-10-CM

## 2017-09-04 DIAGNOSIS — E7800 Pure hypercholesterolemia, unspecified: Secondary | ICD-10-CM

## 2017-09-04 NOTE — Telephone Encounter (Signed)
Pt called. She received a call from this office to schedule cholesterol and bp chk. She has scheduled an appointment on 10/01.  She wants  lab order sent down prior to her appointment.  Thank you

## 2017-09-04 NOTE — Telephone Encounter (Signed)
She would also like to get her liver enzymes completed with her labs also.

## 2017-09-13 ENCOUNTER — Other Ambulatory Visit: Payer: Self-pay | Admitting: Family Medicine

## 2017-09-16 ENCOUNTER — Ambulatory Visit: Payer: Medicare Other | Admitting: Family Medicine

## 2017-09-17 ENCOUNTER — Other Ambulatory Visit: Payer: Self-pay | Admitting: Family Medicine

## 2017-09-17 DIAGNOSIS — Z1231 Encounter for screening mammogram for malignant neoplasm of breast: Secondary | ICD-10-CM

## 2017-09-19 ENCOUNTER — Ambulatory Visit: Payer: Medicare Other | Admitting: Family Medicine

## 2017-09-27 ENCOUNTER — Other Ambulatory Visit: Payer: Self-pay | Admitting: *Deleted

## 2017-09-27 DIAGNOSIS — E7800 Pure hypercholesterolemia, unspecified: Secondary | ICD-10-CM

## 2017-09-27 DIAGNOSIS — I10 Essential (primary) hypertension: Secondary | ICD-10-CM

## 2017-09-27 DIAGNOSIS — R748 Abnormal levels of other serum enzymes: Secondary | ICD-10-CM | POA: Diagnosis not present

## 2017-09-28 LAB — LIPID PANEL W/REFLEX DIRECT LDL
CHOLESTEROL: 169 mg/dL (ref <200)
HDL: 65 mg/dL (ref >50)
LDL Cholesterol (Calc): 86 mg/dL (calc)
Non-HDL Cholesterol (Calc): 104 mg/dL (calc) (ref <130)
Total CHOL/HDL Ratio: 2.6 (calc) (ref <5.0)
Triglycerides: 88 mg/dL (ref <150)

## 2017-09-28 LAB — COMPLETE METABOLIC PANEL WITH GFR
AG RATIO: 1.7 (calc) (ref 1.0–2.5)
ALBUMIN MSPROF: 4 g/dL (ref 3.6–5.1)
ALT: 20 U/L (ref 6–29)
AST: 24 U/L (ref 10–35)
Alkaline phosphatase (APISO): 45 U/L (ref 33–130)
BUN: 15 mg/dL (ref 7–25)
CALCIUM: 9 mg/dL (ref 8.6–10.4)
CO2: 28 mmol/L (ref 20–32)
Chloride: 106 mmol/L (ref 98–110)
Creat: 0.89 mg/dL (ref 0.50–0.99)
GFR, EST AFRICAN AMERICAN: 78 mL/min/{1.73_m2} (ref >=60)
GFR, EST NON AFRICAN AMERICAN: 67 mL/min/{1.73_m2} (ref >=60)
GLOBULIN: 2.4 g/dL (ref 1.9–3.7)
Glucose, Bld: 102 mg/dL — ABNORMAL HIGH (ref 65–99)
POTASSIUM: 4.1 mmol/L (ref 3.5–5.3)
SODIUM: 141 mmol/L (ref 135–146)
TOTAL PROTEIN: 6.4 g/dL (ref 6.1–8.1)
Total Bilirubin: 0.6 mg/dL (ref 0.2–1.2)

## 2017-09-28 LAB — HEPATIC FUNCTION PANEL
AG RATIO: 1.7 (calc) (ref 1.0–2.5)
ALT: 20 U/L (ref 6–29)
AST: 24 U/L (ref 10–35)
Albumin: 4 g/dL (ref 3.6–5.1)
Alkaline phosphatase (APISO): 45 U/L (ref 33–130)
BILIRUBIN TOTAL: 0.6 mg/dL (ref 0.2–1.2)
Bilirubin, Direct: 0.1 mg/dL (ref 0.0–0.2)
Globulin: 2.4 g/dL (calc) (ref 1.9–3.7)
Indirect Bilirubin: 0.5 mg/dL (calc) (ref 0.2–1.2)
Total Protein: 6.4 g/dL (ref 6.1–8.1)

## 2017-09-30 ENCOUNTER — Ambulatory Visit (INDEPENDENT_AMBULATORY_CARE_PROVIDER_SITE_OTHER): Payer: Medicare Other | Admitting: Family Medicine

## 2017-09-30 ENCOUNTER — Telehealth: Payer: Self-pay | Admitting: Family Medicine

## 2017-09-30 ENCOUNTER — Encounter: Payer: Self-pay | Admitting: Family Medicine

## 2017-09-30 VITALS — BP 148/62 | HR 70 | Temp 98.0°F | Ht 62.0 in | Wt 143.0 lb

## 2017-09-30 DIAGNOSIS — I10 Essential (primary) hypertension: Secondary | ICD-10-CM

## 2017-09-30 DIAGNOSIS — E7800 Pure hypercholesterolemia, unspecified: Secondary | ICD-10-CM

## 2017-09-30 DIAGNOSIS — K219 Gastro-esophageal reflux disease without esophagitis: Secondary | ICD-10-CM

## 2017-09-30 DIAGNOSIS — H9202 Otalgia, left ear: Secondary | ICD-10-CM | POA: Diagnosis not present

## 2017-09-30 DIAGNOSIS — R7309 Other abnormal glucose: Secondary | ICD-10-CM | POA: Diagnosis not present

## 2017-09-30 DIAGNOSIS — F5101 Primary insomnia: Secondary | ICD-10-CM | POA: Diagnosis not present

## 2017-09-30 DIAGNOSIS — Z23 Encounter for immunization: Secondary | ICD-10-CM

## 2017-09-30 DIAGNOSIS — H6121 Impacted cerumen, right ear: Secondary | ICD-10-CM | POA: Diagnosis not present

## 2017-09-30 LAB — POCT GLYCOSYLATED HEMOGLOBIN (HGB A1C): HEMOGLOBIN A1C: 5.5

## 2017-09-30 MED ORDER — OMEPRAZOLE 20 MG PO CPDR: 20.0000 mg | DELAYED_RELEASE_CAPSULE | Freq: Every day | ORAL | 3 refills | Status: DC

## 2017-09-30 MED ORDER — PREDNISONE 20 MG PO TABS: 40.0000 mg | ORAL_TABLET | Freq: Every day | ORAL | 0 refills | Status: DC

## 2017-09-30 MED ORDER — METOPROLOL SUCCINATE ER 50 MG PO TB24: 50.0000 mg | ORAL_TABLET | Freq: Every day | ORAL | 1 refills | Status: DC

## 2017-09-30 MED ORDER — ZOLPIDEM TARTRATE 10 MG PO TABS: 10.0000 mg | ORAL_TABLET | Freq: Every day | ORAL | 1 refills | Status: DC

## 2017-09-30 MED ORDER — ATORVASTATIN CALCIUM 20 MG PO TABS: 20.0000 mg | ORAL_TABLET | Freq: Every day | ORAL | 0 refills | Status: DC

## 2017-09-30 NOTE — Progress Notes (Addendum)
Subjective:    CC: BP, lipid. GERD   HPI:  Hyperlipidemia - had lipids check on Friday.  Here to review labs.    Hypertension- Pt denies chest pain, SOB, dizziness, or heart palpitations.  Taking meds as directed w/o problems.  Denies medication side effects.    She also had URI sxs that started On Friday, 4 days ago..  Usually takes corcidan but was worried about taking it bc of her liver enzymes being elevated slightly in the recent past.  Having some left ear pain.  There her left ear was bothering her before the cold symptoms started. Has actually been going on for a few weeks. She tends to get fluid buildup in that ear. It happened last fall as well and she responded really well to prednisone.  GERD-she was interested in possibly trying to wean down her omeprazole.  Past medical history, Surgical history, Family history not pertinant except as noted below, Social history, Allergies, and medications have been entered into the medical record, reviewed, and corrections made.   Review of Systems: No fevers, chills, night sweats, weight loss, chest pain, or shortness of breath.   Objective:    General: Well Developed, well nourished, and in no acute distress.  Neuro: Alert and oriented x3, extra-ocular muscles intact, sensation grossly intact.  HEENT: Normocephalic, atraumatic, right ear canal is blocked by cerumen. Left canal is partially blocked by cerumen but I am able to see about two thirds the tympanic membrane. No erythema and possible questionable fluid behind the eardrum.  Skin: Warm and dry, no rashes. Cardiac: Regular rate and rhythm, no murmurs rubs or gallops, no lower extremity edema.  Respiratory: Clear to auscultation bilaterally. Not using accessory muscles, speaking in full sentences.   Impression and Recommendations:    Hyperlipidemia - Reviewed lipid levels with her. Overall they look okay on the 20 mg of atorvastatin and her liver enzymes are back down.  Elevated  liver enzymes-likely secondary to statin as they went down to normal after cutting her dose in half.  HTN - BP uncontrolled.  Recheck in 2 weeks with nurse visit.    URI - She seems to be improving already on her own.  Left ear pain-no significant erythema over the TM but some questionable fluid. Will treat with oral prednisone that she did respond well to that last fall. If not improving them recommend ENT referral.  Insomnia-due for refill on her Ambien.  GERD-decrease in upper saw down to every other day for about a month after doing well then decrease down to every third day and then taper off as doing well.  Abnormal glucose-glucose was mildly elevated on recent lab work. 11 A1c 5.5, and the normal range.

## 2017-09-30 NOTE — Telephone Encounter (Signed)
Call pt: needs repeat BP check with nurse in 2 weeks.

## 2017-09-30 NOTE — Telephone Encounter (Signed)
Notified patient of BP check, she will call office for appointment.

## 2017-09-30 NOTE — Progress Notes (Signed)
All labs are normal. 

## 2017-10-09 ENCOUNTER — Ambulatory Visit: Payer: Medicare Other

## 2017-10-14 ENCOUNTER — Ambulatory Visit (INDEPENDENT_AMBULATORY_CARE_PROVIDER_SITE_OTHER): Payer: Medicare Other | Admitting: Family Medicine

## 2017-10-14 VITALS — BP 135/71 | HR 69

## 2017-10-14 DIAGNOSIS — I10 Essential (primary) hypertension: Secondary | ICD-10-CM

## 2017-10-14 NOTE — Progress Notes (Signed)
Pt came into clinic today for BP check. On first check, BP was above goal. It did get better. Pt brought home log of BP readings, they fluctuated from 103/73-146/77. Spoke with PCP, advised Pt could either double her metoprolol to 100mg  daily or add HCTZ 12.5mg . Pt is going to increase her metoprolol to 100mg  daily, she will take one tab in the morning and one tab at night. She will come back in 2 weeks or BP check NV. She will bring her home readings.  Did also mention [facility] increased the price of Ambien, states she is "shopping for a new place to fill it." Pt states right now the Rx is ready at [facility], but if she can find it cheaper she would like it moved. Advised if she needs a new Rx to let us know.

## 2017-10-14 NOTE — Progress Notes (Signed)
HTN -agree with above.  Follow-up in 2 weeks per.  Beatrice Lecher, MD

## 2017-10-23 ENCOUNTER — Ambulatory Visit
Admission: RE | Admit: 2017-10-23 | Discharge: 2017-10-23 | Disposition: A | Discharge status: Home / Self Care | Payer: Medicare Other | Source: Ambulatory Visit | Attending: Family Medicine | Admitting: Family Medicine

## 2017-10-23 DIAGNOSIS — Z1231 Encounter for screening mammogram for malignant neoplasm of breast: Secondary | ICD-10-CM

## 2017-10-26 ENCOUNTER — Encounter: Payer: Self-pay | Admitting: Family Medicine

## 2017-10-26 ENCOUNTER — Other Ambulatory Visit: Payer: Self-pay | Admitting: Family Medicine

## 2017-10-29 ENCOUNTER — Ambulatory Visit: Payer: Medicare Other

## 2017-10-30 ENCOUNTER — Ambulatory Visit (INDEPENDENT_AMBULATORY_CARE_PROVIDER_SITE_OTHER): Payer: Medicare Other | Admitting: Family Medicine

## 2017-10-30 VITALS — BP 150/80 | HR 61 | Wt 147.0 lb

## 2017-10-30 DIAGNOSIS — H2513 Age-related nuclear cataract, bilateral: Secondary | ICD-10-CM | POA: Diagnosis not present

## 2017-10-30 DIAGNOSIS — I10 Essential (primary) hypertension: Secondary | ICD-10-CM

## 2017-10-30 DIAGNOSIS — H527 Unspecified disorder of refraction: Secondary | ICD-10-CM | POA: Diagnosis not present

## 2017-10-30 NOTE — Progress Notes (Signed)
Pt denies chest pain, SOB, dizziness, or heart palpitations.  Taking meds as directed w/o problems.  Denies medication side effects.  5 min spent with pt. Patient is taking metoprolol 50 mg in the morning and 50 mg in the evening. BP was elevated here in the office but BP reading at home was in the normal range. Had the patient sit for 5 min and recheck BP  Home BP reading were placed in Dr. Gardiner Ramus box. Advised patient to come back in in two weeks with her BP cuff so that we can compare her cuff to our machine.

## 2017-10-30 NOTE — Progress Notes (Signed)
Hypertension-hopefully if blood pressure cuff is accurate and we can go based on her home blood pressures which look fantastic upon the review of her log that she brought in today.  She will come back in 2 weeks and so for now we will continue current regimen.  Beatrice Lecher, MD

## 2017-11-13 ENCOUNTER — Ambulatory Visit (INDEPENDENT_AMBULATORY_CARE_PROVIDER_SITE_OTHER): Payer: Medicare Other | Admitting: Family Medicine

## 2017-11-13 VITALS — BP 157/60 | HR 66 | Wt 148.0 lb

## 2017-11-13 DIAGNOSIS — I10 Essential (primary) hypertension: Secondary | ICD-10-CM

## 2017-11-13 MED ORDER — HYDROCHLOROTHIAZIDE 12.5 MG PO TABS: 12.5000 mg | ORAL_TABLET | Freq: Every day | ORAL | 1 refills | Status: DC

## 2017-11-13 MED ORDER — METOPROLOL SUCCINATE ER 100 MG PO TB24: 100.0000 mg | ORAL_TABLET | Freq: Every day | ORAL | 1 refills | Status: DC

## 2017-11-13 NOTE — Progress Notes (Signed)
   Subjective:    Patient ID: Deborah Mccullough, female    DOB: 10-Dec-1951, 66 y.o.   MRN: [id number]  HPI  Hypertension - Last visit blood pressure was 150/80. Today blood pressure was 157/60 with clinic monitor and 151/72 with home monitor. Recheck of blood pressure was 143/61. Patient states she is taking her blood pressure medication as directed. Denies chest pain, shortness of breath, headaches or dizziness.    Review of Systems     Objective:   Physical Exam        Assessment & Plan:  Hypertension - Per Dr Madilyn Fireman - Add HCTZ 12.5 mg and send in a prescription for 100 mg of Metoprolol so patient will not have to take 2, 50 mg tablets. Patient advised to follow up with a nurse visit in 2 weeks.   Agree with  Above.    Beatrice Lecher, MD

## 2017-11-27 ENCOUNTER — Ambulatory Visit (INDEPENDENT_AMBULATORY_CARE_PROVIDER_SITE_OTHER): Payer: Medicare Other | Admitting: Family Medicine

## 2017-11-27 VITALS — BP 146/62 | HR 63 | Wt 150.0 lb

## 2017-11-27 DIAGNOSIS — I10 Essential (primary) hypertension: Secondary | ICD-10-CM

## 2017-11-27 LAB — BASIC METABOLIC PANEL WITH GFR
BUN: 15 mg/dL (ref 7–25)
CHLORIDE: 101 mmol/L (ref 98–110)
CO2: 30 mmol/L (ref 20–32)
Calcium: 9.2 mg/dL (ref 8.6–10.4)
Creat: 0.9 mg/dL (ref 0.50–0.99)
GFR, EST AFRICAN AMERICAN: 76 mL/min/{1.73_m2} (ref >=60)
GFR, EST NON AFRICAN AMERICAN: 66 mL/min/{1.73_m2} (ref >=60)
Glucose, Bld: 105 mg/dL — ABNORMAL HIGH (ref 65–99)
POTASSIUM: 4.3 mmol/L (ref 3.5–5.3)
SODIUM: 137 mmol/L (ref 135–146)

## 2017-11-27 NOTE — Progress Notes (Signed)
HTN -   Home blood pressures look absolute fantastic and when she brought her cuff in the last time it was fairly consistent with our monitor here.  So we will continue her current regimen and have her keep her regularly scheduled follow-up in April with me.  Beatrice Lecher, MD

## 2017-11-27 NOTE — Progress Notes (Signed)
b  Subjective:    Patient ID: Deborah Mccullough, female    DOB: 31-Mar-1951, 66 y.o.   MRN: [id number]  HPI  Deborah Mccullough is her for a blood pressure check. Denies chest pain, shortness of breath and dizziness. She has notice an increase in urination due to starting the HCTZ.   Review of Systems     Objective:   Physical Exam        Assessment & Plan:  Hypertension - Patient's home blood pressure readings are great and the last visit the home monitor was checked and was within a executable range. Continue current medications. BMP ordered for today. Follow up with Dr Deborah Mccullough in April of 2019.

## 2017-11-28 ENCOUNTER — Other Ambulatory Visit: Payer: Self-pay | Admitting: Family Medicine

## 2017-11-28 NOTE — Progress Notes (Signed)
All labs are normal. 

## 2017-12-26 ENCOUNTER — Other Ambulatory Visit: Payer: Self-pay | Admitting: Family Medicine

## 2017-12-26 DIAGNOSIS — F5101 Primary insomnia: Secondary | ICD-10-CM

## 2018-01-02 ENCOUNTER — Telehealth: Payer: Self-pay | Admitting: *Deleted

## 2018-01-02 NOTE — Telephone Encounter (Signed)
Pre Authorization sent to cover my meds. [id number]

## 2018-01-03 NOTE — Telephone Encounter (Signed)
Zolpidem has been approved by insurance.pharmacy notified

## 2018-01-04 ENCOUNTER — Other Ambulatory Visit: Payer: Self-pay | Admitting: Family Medicine

## 2018-01-10 ENCOUNTER — Other Ambulatory Visit: Payer: Self-pay | Admitting: Family Medicine

## 2018-01-25 ENCOUNTER — Other Ambulatory Visit: Payer: Self-pay | Admitting: Family Medicine

## 2018-02-11 ENCOUNTER — Other Ambulatory Visit: Payer: Self-pay | Admitting: Family Medicine

## 2018-02-20 ENCOUNTER — Other Ambulatory Visit: Payer: Self-pay | Admitting: Family Medicine

## 2018-03-13 ENCOUNTER — Ambulatory Visit (INDEPENDENT_AMBULATORY_CARE_PROVIDER_SITE_OTHER): Payer: Medicare Other | Admitting: Family Medicine

## 2018-03-13 ENCOUNTER — Encounter: Payer: Self-pay | Admitting: Family Medicine

## 2018-03-13 VITALS — BP 133/76 | HR 63 | Ht 62.0 in | Wt 151.0 lb

## 2018-03-13 DIAGNOSIS — H9202 Otalgia, left ear: Secondary | ICD-10-CM | POA: Diagnosis not present

## 2018-03-13 DIAGNOSIS — Z01118 Encounter for examination of ears and hearing with other abnormal findings: Secondary | ICD-10-CM

## 2018-03-13 MED ORDER — PREDNISONE 20 MG PO TABS: 40.0000 mg | ORAL_TABLET | Freq: Every day | ORAL | 0 refills | Status: DC
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
## 2018-03-22 ENCOUNTER — Other Ambulatory Visit: Payer: Self-pay | Admitting: Family Medicine

## 2018-03-26 ENCOUNTER — Encounter: Payer: Self-pay | Admitting: Family Medicine

## 2018-03-26 ENCOUNTER — Ambulatory Visit (INDEPENDENT_AMBULATORY_CARE_PROVIDER_SITE_OTHER): Payer: Medicare Other | Admitting: Family Medicine

## 2018-03-26 VITALS — BP 144/61 | HR 68 | Ht 62.0 in | Wt 153.0 lb

## 2018-03-26 DIAGNOSIS — N950 Postmenopausal bleeding: Secondary | ICD-10-CM

## 2018-03-26 DIAGNOSIS — H9202 Otalgia, left ear: Secondary | ICD-10-CM | POA: Insufficient documentation

## 2018-03-26 DIAGNOSIS — M26622 Arthralgia of left temporomandibular joint: Secondary | ICD-10-CM | POA: Insufficient documentation
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
## 2018-03-26 NOTE — Patient Instructions (Signed)
Thank you for coming in today. Please schedule the ultrasound and you should hear from OBGYN soon.  Recheck as needed.  Let me know if you run into any barriers.   Postmenopausal Bleeding Postmenopausal bleeding is any bleeding a woman has after she has entered into menopause. Menopause is the end of a woman's fertile years. After menopause, a woman no longer ovulates or has menstrual periods. Postmenopausal bleeding can be caused by various things. Any type of postmenopausal bleeding, even if it appears to be a typical menstrual period, is concerning. This should be evaluated by your health care provider. Any treatment will depend on the cause of the bleeding. Follow these instructions at home: Monitor your condition for any changes. The following actions may help to alleviate any discomfort you are experiencing:  Avoid the use of tampons and douches as directed by your health care provider.  Change your pads frequently.  Get regular pelvic exams and Pap tests.  Keep all follow-up appointments for diagnostic tests as directed by your health care provider.  Contact a health care provider if:  Your bleeding lasts more than 1 week.  You have abdominal pain.  You have bleeding with sexual intercourse. Get help right away if:  You have a fever, chills, headache, dizziness, muscle aches, and bleeding.  You have severe pain with bleeding.  You are passing blood clots.  You have bleeding and need more than 1 pad an hour.  You feel faint. This information is not intended to replace advice given to you by your health care provider. Make sure you discuss any questions you have with your health care provider. Document Released: 03/13/2006 Document Revised: 05/10/2016 Document Reviewed: 07/02/2013 Elsevier Interactive Patient Education  [employer].

## 2018-03-27 ENCOUNTER — Ambulatory Visit (INDEPENDENT_AMBULATORY_CARE_PROVIDER_SITE_OTHER): Payer: Medicare Other

## 2018-03-27 DIAGNOSIS — R9389 Abnormal findings on diagnostic imaging of other specified body structures: Secondary | ICD-10-CM | POA: Diagnosis not present

## 2018-03-27 DIAGNOSIS — N950 Postmenopausal bleeding: Secondary | ICD-10-CM | POA: Diagnosis not present

## 2018-03-28 ENCOUNTER — Encounter: Payer: Self-pay | Admitting: Family Medicine

## 2018-03-31 ENCOUNTER — Encounter: Payer: Medicare Other | Admitting: Family Medicine

## 2018-03-31 ENCOUNTER — Telehealth: Payer: Self-pay | Admitting: Obstetrics and Gynecology

## 2018-03-31 NOTE — Telephone Encounter (Addendum)
Pt has new GYN appt 04/15/18 with [facility]. Pt states Michiel Cowboy PCP ordered U.S. And referred here for biopsy for post menopausal bleeding. Pt wants to verify the appt with [facility] encompasses a biopsy same day as well. Please advise. Pt gave permission to speak with spouse Denyse Amass if she is unavailable. Pt [phone].

## 2018-04-11 ENCOUNTER — Telehealth: Payer: Self-pay | Admitting: Family Medicine

## 2018-04-11 MED ORDER — LORAZEPAM 0.5 MG PO TABS: ORAL_TABLET | ORAL | 0 refills | Status: DC

## 2018-04-11 NOTE — Telephone Encounter (Signed)
-----   Message from Asencion Islam, RN sent at 04/10/2018  4:03 PM EDT ----- Regarding: Meds Hey Dr Georgina Snell, You referred this patient to [place] for an Endometrial biopsy.  She has called requesting something for relaxation to take prior to her procedure.  Since she has not been seen yet by our provider they will not prescribe her anything.  Would you be willing to give her 1-2 pills of something to take for anxiety prior to her procedure on Tuesday? If your nurse could call her and let her know that may help her anxiety.  Thanks, Sherryle Lis, RN

## 2018-04-11 NOTE — Telephone Encounter (Signed)
I called and spoke with Deborah Mccullough this morning.  She is anxious about the upcoming endometrial biopsy. I prescribed lorazepam to use prior to the procedure.  Additionally advised taking ibuprofen about an hour before the procedure. I noted that she will need someone to drive her to and from the procedure if she takes lorazepam.

## 2018-04-15 ENCOUNTER — Ambulatory Visit (INDEPENDENT_AMBULATORY_CARE_PROVIDER_SITE_OTHER): Payer: Medicare Other | Admitting: Family Medicine

## 2018-04-15 ENCOUNTER — Encounter: Payer: Self-pay | Admitting: Obstetrics and Gynecology

## 2018-04-15 ENCOUNTER — Encounter: Payer: Self-pay | Admitting: Family Medicine

## 2018-04-15 ENCOUNTER — Ambulatory Visit (INDEPENDENT_AMBULATORY_CARE_PROVIDER_SITE_OTHER): Payer: Medicare Other | Admitting: Obstetrics and Gynecology

## 2018-04-15 VITALS — BP 150/78 | HR 72 | Ht 67.0 in | Wt 151.0 lb

## 2018-04-15 VITALS — BP 132/65 | HR 67 | Ht 62.0 in | Wt 150.0 lb

## 2018-04-15 DIAGNOSIS — R5383 Other fatigue: Secondary | ICD-10-CM | POA: Diagnosis not present

## 2018-04-15 DIAGNOSIS — N950 Postmenopausal bleeding: Secondary | ICD-10-CM

## 2018-04-15 DIAGNOSIS — M26622 Arthralgia of left temporomandibular joint: Secondary | ICD-10-CM

## 2018-04-15 DIAGNOSIS — R110 Nausea: Secondary | ICD-10-CM

## 2018-04-15 DIAGNOSIS — I10 Essential (primary) hypertension: Secondary | ICD-10-CM | POA: Diagnosis not present

## 2018-04-15 DIAGNOSIS — Z0000 Encounter for general adult medical examination without abnormal findings: Secondary | ICD-10-CM

## 2018-04-15 DIAGNOSIS — H9202 Otalgia, left ear: Secondary | ICD-10-CM | POA: Diagnosis not present

## 2018-04-15 LAB — CBC WITH DIFFERENTIAL/PLATELET
BASOS PCT: 0.7 %
Basophils Absolute: 38 cells/uL (ref 0–200)
Eosinophils Absolute: 49 cells/uL (ref 15–500)
Eosinophils Relative: 0.9 %
HEMATOCRIT: 40.4 % (ref 35.0–45.0)
Hemoglobin: 14 g/dL (ref 11.7–15.5)
LYMPHS ABS: 1237 {cells}/uL (ref 850–3900)
MCH: 31.3 pg (ref 27.0–33.0)
MCHC: 34.7 g/dL (ref 32.0–36.0)
MCV: 90.2 fL (ref 80.0–100.0)
MPV: 9 fL (ref 7.5–12.5)
Monocytes Relative: 8.4 %
Neutro Abs: 3623 cells/uL (ref 1500–7800)
Neutrophils Relative %: 67.1 %
Platelets: 272 10*3/uL (ref 140–400)
RBC: 4.48 10*6/uL (ref 3.80–5.10)
RDW: 12.8 % (ref 11.0–15.0)
Total Lymphocyte: 22.9 %
WBC mixed population: 454 cells/uL (ref 200–950)
WBC: 5.4 10*3/uL (ref 3.8–10.8)

## 2018-04-15 LAB — COMPLETE METABOLIC PANEL WITH GFR
AG Ratio: 1.7 (calc) (ref 1.0–2.5)
ALBUMIN MSPROF: 4.3 g/dL (ref 3.6–5.1)
ALKALINE PHOSPHATASE (APISO): 47 U/L (ref 33–130)
ALT: 26 U/L (ref 6–29)
AST: 29 U/L (ref 10–35)
BUN: 16 mg/dL (ref 7–25)
CALCIUM: 9.5 mg/dL (ref 8.6–10.4)
CO2: 27 mmol/L (ref 20–32)
CREATININE: 0.85 mg/dL (ref 0.50–0.99)
Chloride: 100 mmol/L (ref 98–110)
GFR, Est African American: 82 mL/min/{1.73_m2} (ref >=60)
GFR, Est Non African American: 70 mL/min/{1.73_m2} (ref >=60)
GLUCOSE: 94 mg/dL (ref 65–99)
Globulin: 2.6 g/dL (calc) (ref 1.9–3.7)
Potassium: 4 mmol/L (ref 3.5–5.3)
Sodium: 139 mmol/L (ref 135–146)
Total Bilirubin: 0.7 mg/dL (ref 0.2–1.2)
Total Protein: 6.9 g/dL (ref 6.1–8.1)

## 2018-04-15 LAB — SEDIMENTATION RATE: Sed Rate: 6 mm/h (ref 0–30)

## 2018-04-15 MED ORDER — MEDROXYPROGESTERONE ACETATE 2.5 MG PO TABS: 2.5000 mg | ORAL_TABLET | Freq: Every day | ORAL | 1 refills | Status: DC

## 2018-04-15 MED ORDER — IBUPROFEN 600 MG PO TABS: 600.0000 mg | ORAL_TABLET | Freq: Three times a day (TID) | ORAL | 3 refills | Status: DC | PRN

## 2018-04-15 MED ORDER — METOPROLOL SUCCINATE ER 100 MG PO TB24: 100.0000 mg | ORAL_TABLET | Freq: Every day | ORAL | 1 refills | Status: DC

## 2018-04-15 MED ORDER — ESTRADIOL 0.5 MG PO TABS: 0.5000 mg | ORAL_TABLET | Freq: Every day | ORAL | 6 refills | Status: DC
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
## 2018-04-15 NOTE — Progress Notes (Signed)
Subjective:    Patient ID: Deborah Mccullough, female    DOB: Feb 26, 1951, 68 y.o.   MRN: [id number]  HPI He is also concerned because she is having some nausea and queasiness over the last couple of days.  She thinks it could be coming from her ear but she is not sure.  I had seen her back in February for problems with her left ear.  After irrigation almost look like there was a wrinkle in the tympanic membrane and she was having a lot of discomfort so we sent her to ENT.  They said it was just a thin layer of wax that was folded over on top of the eardrum and did not recommend any type of removal.  She is not having any ear pain right now but says it does not feel quite right.  The ENT doctor felt like most of her pain was actually coming from her TMJ joint and recommended heat, warm compresses and ibuprofen 600 mg up to 3 times a day which she took for about 2 weeks and says it got much better.  She denies any dizziness or lightheadedness.  She has felt more anxious very since having problems with her left ear.  She recently started some CBD oil which she just uses occasionally and says it has been helpful.  She is been feeling more tired than usual and wonders if it could be coming from the hydrochlorothiazide which is her newest blood pressure medication.  I believe she is been on it for several months at this point.  She wonders if it could be causing some low potassium so she is been trying to eat more bananas.  Hypertension- Pt denies chest pain, SOB, dizziness, or heart palpitations.  Taking meds as directed w/o problems.  Denies medication side effects.      Review of Systems     Objective:   Physical Exam  Constitutional: She is oriented to person, place, and time. She appears well-developed and well-nourished.  HENT:  Head: Normocephalic and atraumatic.  Right Ear: External ear normal.  Left Ear: External ear normal.  Nose: Nose normal.  Mouth/Throat: Oropharynx is clear and  moist.  TMs and canals are clear.   Eyes: Pupils are equal, round, and reactive to light. Conjunctivae and EOM are normal.  Neck: Neck supple. No thyromegaly present.  Cardiovascular: Normal rate, regular rhythm and normal heart sounds.  Pulmonary/Chest: Effort normal and breath sounds normal. She has no wheezes.  Lymphadenopathy:    She has no cervical adenopathy.  Neurological: She is alert and oriented to person, place, and time.  Skin: Skin is warm and dry.  Psychiatric: She has a normal mood and affect.          Assessment & Plan:  HTN  - Well controlled. Continue current regimen. Follow up in  46months   Fatigue-unclear etiology.  Will check for anemia, inflammation and liver and kidney function.  She wonders if it could be her blood pressure medications which is certainly possible.  Both of them the HCTZ and metoprolol can cause some fatigue.  I will be happy to try something else if at some point she wants to do so.  For now she will just continue to monitor.  Left ear pain-ear exam is still normal today.  Gave reassurance.  I think it could be coming for her and her TMJ again so encouraged her to restart the ibuprofen.  New prescription sent as well as doing her heat  packs which seemed to have helped previously.  If it continues to bother her then please let me know.  Nausea-unclear etiology.  I do not think this is necessarily related to her ear because she does not really experience any dizziness along with the nausea.  She does not report having eaten anything unusual.

## 2018-04-15 NOTE — Patient Instructions (Addendum)
If your nausea is not improving over the next couple of days then please let me know.  Try the warm compresses and restart ibuprofen for about 4 days for the TMJ joint and just see if you feel any better. We will call you with the labs once they are available.  Health Maintenance, Female Adopting a healthy lifestyle and getting preventive care can go a long way to promote health and wellness. Talk with your health care provider about what schedule of regular examinations is right for you. This is a good chance for you to check in with your provider about disease prevention and staying healthy. In between checkups, there are plenty of things you can do on your own. Experts have done a lot of research about which lifestyle changes and preventive measures are most likely to keep you healthy. Ask your health care provider for more information. Weight and diet Eat a healthy diet  Be sure to include plenty of vegetables, fruits, low-fat dairy products, and lean protein.  Do not eat a lot of foods high in solid fats, added sugars, or salt.  Get regular exercise. This is one of the most important things you can do for your health. ? Most adults should exercise for at least 150 minutes each week. The exercise should increase your heart rate and make you sweat (moderate-intensity exercise). ? Most adults should also do strengthening exercises at least twice a week. This is in addition to the moderate-intensity exercise.  Maintain a healthy weight  Body mass index (BMI) is a measurement that can be used to identify possible weight problems. It estimates body fat based on height and weight. Your health care provider can help determine your BMI and help you achieve or maintain a healthy weight.  For females 37 years of age and older: ? A BMI below 18.5 is considered underweight. ? A BMI of 18.5 to 24.9 is normal. ? A BMI of 25 to 29.9 is considered overweight. ? A BMI of 30 and above is considered  obese.  Watch levels of cholesterol and blood lipids  You should start having your blood tested for lipids and cholesterol at 69 years of age, then have this test every 5 years.  You may need to have your cholesterol levels checked more often if: ? Your lipid or cholesterol levels are high. ? You are older than 69 years of age. ? You are at high risk for heart disease.  Cancer screening Lung Cancer  Lung cancer screening is recommended for adults 46-61 years old who are at high risk for lung cancer because of a history of smoking.  A yearly low-dose CT scan of the lungs is recommended for people who: ? Currently smoke. ? Have quit within the past 15 years. ? Have at least a 30-pack-year history of smoking. A pack year is smoking an average of one pack of cigarettes a day for 1 year.  Yearly screening should continue until it has been 15 years since you quit.  Yearly screening should stop if you develop a health problem that would prevent you from having lung cancer treatment.  Breast Cancer  Practice breast self-awareness. This means understanding how your breasts normally appear and feel.  It also means doing regular breast self-exams. Let your health care provider know about any changes, no matter how small.  If you are in your 20s or 30s, you should have a clinical breast exam (CBE) by a health care provider every 1-3 years as  part of a regular health exam.  If you are 40 or older, have a CBE every year. Also consider having a breast X-ray (mammogram) every year.  If you have a family history of breast cancer, talk to your health care provider about genetic screening.  If you are at high risk for breast cancer, talk to your health care provider about having an MRI and a mammogram every year.  Breast cancer gene (BRCA) assessment is recommended for women who have family members with BRCA-related cancers. BRCA-related cancers  include: ? Breast. ? Ovarian. ? Tubal. ? Peritoneal cancers.  Results of the assessment will determine the need for genetic counseling and BRCA1 and BRCA2 testing.  Cervical Cancer Your health care provider may recommend that you be screened regularly for cancer of the pelvic organs (ovaries, uterus, and vagina). This screening involves a pelvic examination, including checking for microscopic changes to the surface of your cervix (Pap test). You may be encouraged to have this screening done every 3 years, beginning at age 66.  For women ages 23-65, health care providers may recommend pelvic exams and Pap testing every 3 years, or they may recommend the Pap and pelvic exam, combined with testing for human papilloma virus (HPV), every 5 years. Some types of HPV increase your risk of cervical cancer. Testing for HPV may also be done on women of any age with unclear Pap test results.  Other health care providers may not recommend any screening for nonpregnant women who are considered low risk for pelvic cancer and who do not have symptoms. Ask your health care provider if a screening pelvic exam is right for you.  If you have had past treatment for cervical cancer or a condition that could lead to cancer, you need Pap tests and screening for cancer for at least 20 years after your treatment. If Pap tests have been discontinued, your risk factors (such as having a new sexual partner) need to be reassessed to determine if screening should resume. Some women have medical problems that increase the chance of getting cervical cancer. In these cases, your health care provider may recommend more frequent screening and Pap tests.  Colorectal Cancer  This type of cancer can be detected and often prevented.  Routine colorectal cancer screening usually begins at 69 years of age and continues through 69 years of age.  Your health care provider may recommend screening at an earlier age if you have risk factors  for colon cancer.  Your health care provider may also recommend using home test kits to check for hidden blood in the stool.  A small camera at the end of a tube can be used to examine your colon directly (sigmoidoscopy or colonoscopy). This is done to check for the earliest forms of colorectal cancer.  Routine screening usually begins at age 84.  Direct examination of the colon should be repeated every 5-10 years through 69 years of age. However, you may need to be screened more often if early forms of precancerous polyps or small growths are found.  Skin Cancer  Check your skin from head to toe regularly.  Tell your health care provider about any new moles or changes in moles, especially if there is a change in a mole's shape or color.  Also tell your health care provider if you have a mole that is larger than the size of a pencil eraser.  Always use sunscreen. Apply sunscreen liberally and repeatedly throughout the day.  Protect yourself by wearing  long sleeves, pants, a wide-brimmed hat, and sunglasses whenever you are outside.  Heart disease, diabetes, and high blood pressure  High blood pressure causes heart disease and increases the risk of stroke. High blood pressure is more likely to develop in: ? People who have blood pressure in the high end of the normal range (130-139/85-89 mm Hg). ? People who are overweight or obese. ? People who are African American.  If you are 84-53 years of age, have your blood pressure checked every 3-5 years. If you are 36 years of age or older, have your blood pressure checked every year. You should have your blood pressure measured twice-once when you are at a hospital or clinic, and once when you are not at a hospital or clinic. Record the average of the two measurements. To check your blood pressure when you are not at a hospital or clinic, you can use: ? An automated blood pressure machine at a pharmacy. ? A home blood pressure monitor.  If  you are between 81 years and 46 years old, ask your health care provider if you should take aspirin to prevent strokes.  Have regular diabetes screenings. This involves taking a blood sample to check your fasting blood sugar level. ? If you are at a normal weight and have a low risk for diabetes, have this test once every three years after 69 years of age. ? If you are overweight and have a high risk for diabetes, consider being tested at a younger age or more often. Preventing infection Hepatitis B  If you have a higher risk for hepatitis B, you should be screened for this virus. You are considered at high risk for hepatitis B if: ? You were born in a country where hepatitis B is common. Ask your health care provider which countries are considered high risk. ? Your parents were born in a high-risk country, and you have not been immunized against hepatitis B (hepatitis B vaccine). ? You have HIV or AIDS. ? You use needles to inject street drugs. ? You live with someone who has hepatitis B. ? You have had sex with someone who has hepatitis B. ? You get hemodialysis treatment. ? You take certain medicines for conditions, including cancer, organ transplantation, and autoimmune conditions.  Hepatitis C  Blood testing is recommended for: ? Everyone born from [birth year] through 1967. ? Anyone with known risk factors for hepatitis C.  Sexually transmitted infections (STIs)  You should be screened for sexually transmitted infections (STIs) including gonorrhea and chlamydia if: ? You are sexually active and are younger than 69 years of age. ? You are older than 69 years of age and your health care provider tells you that you are at risk for this type of infection. ? Your sexual activity has changed since you were last screened and you are at an increased risk for chlamydia or gonorrhea. Ask your health care provider if you are at risk.  If you do not have HIV, but are at risk, it may be recommended  that you take a prescription medicine daily to prevent HIV infection. This is called pre-exposure prophylaxis (PrEP). You are considered at risk if: ? You are sexually active and do not regularly use condoms or know the HIV status of your partner(s). ? You take drugs by injection. ? You are sexually active with a partner who has HIV.  Talk with your health care provider about whether you are at high risk of being infected with HIV.  If you choose to begin PrEP, you should first be tested for HIV. You should then be tested every 3 months for as long as you are taking PrEP. Pregnancy  If you are premenopausal and you may become pregnant, ask your health care provider about preconception counseling.  If you may become pregnant, take 400 to 800 micrograms (mcg) of folic acid every day.  If you want to prevent pregnancy, talk to your health care provider about birth control (contraception). Osteoporosis and menopause  Osteoporosis is a disease in which the bones lose minerals and strength with aging. This can result in serious bone fractures. Your risk for osteoporosis can be identified using a bone density scan.  If you are 8 years of age or older, or if you are at risk for osteoporosis and fractures, ask your health care provider if you should be screened.  Ask your health care provider whether you should take a calcium or vitamin D supplement to lower your risk for osteoporosis.  Menopause may have certain physical symptoms and risks.  Hormone replacement therapy may reduce some of these symptoms and risks. Talk to your health care provider about whether hormone replacement therapy is right for you. Follow these instructions at home:  Schedule regular health, dental, and eye exams.  Stay current with your immunizations.  Do not use any tobacco products including cigarettes, chewing tobacco, or electronic cigarettes.  If you are pregnant, do not drink alcohol.  If you are  breastfeeding, limit how much and how often you drink alcohol.  Limit alcohol intake to no more than 1 drink per day for nonpregnant women. One drink equals 12 ounces of beer, 5 ounces of wine, or 1 ounces of hard liquor.  Do not use street drugs.  Do not share needles.  Ask your health care provider for help if you need support or information about quitting drugs.  Tell your health care provider if you often feel depressed.  Tell your health care provider if you have ever been abused or do not feel safe at home. This information is not intended to replace advice given to you by your health care provider. Make sure you discuss any questions you have with your health care provider. Document Released: 06/18/2011 Document Revised: 05/10/2016 Document Reviewed: 09/06/2015 Elsevier Interactive Patient Education  [employer].

## 2018-04-21 ENCOUNTER — Encounter: Payer: Self-pay | Admitting: Family Medicine

## 2018-07-22 ENCOUNTER — Ambulatory Visit: Payer: Medicare Other | Admitting: Family Medicine

## 2018-08-08 ENCOUNTER — Other Ambulatory Visit: Payer: Self-pay | Admitting: Family Medicine

## 2018-10-03 ENCOUNTER — Other Ambulatory Visit: Payer: Self-pay | Admitting: Family Medicine

## 2018-10-03 DIAGNOSIS — Z1231 Encounter for screening mammogram for malignant neoplasm of breast: Secondary | ICD-10-CM

## 2018-10-10 DIAGNOSIS — Z23 Encounter for immunization: Secondary | ICD-10-CM | POA: Diagnosis not present

## 2018-10-24 ENCOUNTER — Other Ambulatory Visit: Payer: Self-pay | Admitting: Family Medicine

## 2018-10-28 ENCOUNTER — Ambulatory Visit
Admission: RE | Admit: 2018-10-28 | Discharge: 2018-10-28 | Disposition: A | Discharge status: Home / Self Care | Payer: Medicare Other | Source: Ambulatory Visit | Attending: Family Medicine | Admitting: Family Medicine

## 2018-10-28 DIAGNOSIS — Z1231 Encounter for screening mammogram for malignant neoplasm of breast: Secondary | ICD-10-CM

## 2018-11-10 DIAGNOSIS — H2513 Age-related nuclear cataract, bilateral: Secondary | ICD-10-CM | POA: Diagnosis not present

## 2018-11-10 DIAGNOSIS — H5213 Myopia, bilateral: Secondary | ICD-10-CM | POA: Diagnosis not present

## 2018-11-10 DIAGNOSIS — H524 Presbyopia: Secondary | ICD-10-CM | POA: Diagnosis not present

## 2018-12-01 DIAGNOSIS — M79671 Pain in right foot: Secondary | ICD-10-CM | POA: Insufficient documentation

## 2018-12-01 DIAGNOSIS — M25531 Pain in right wrist: Secondary | ICD-10-CM | POA: Diagnosis not present

## 2018-12-01 DIAGNOSIS — M25532 Pain in left wrist: Secondary | ICD-10-CM | POA: Diagnosis not present

## 2018-12-01 DIAGNOSIS — M1712 Unilateral primary osteoarthritis, left knee: Secondary | ICD-10-CM | POA: Diagnosis not present

## 2018-12-08 ENCOUNTER — Telehealth: Payer: Self-pay

## 2018-12-08 NOTE — Telephone Encounter (Signed)
If her cough is more productive than she can use Mucinex.  If it is not okay to use plain Delsym.

## 2018-12-08 NOTE — Telephone Encounter (Signed)
Deborah Mccullough called and complains of swollen glands, cough and ear fullness for 5 days. Denies fever, chills or sweats. She has been taking chloricedin without relief. She wanted to know if Dr Madilyn Fireman could recommend another OTC medication.

## 2018-12-11 NOTE — Telephone Encounter (Signed)
Patient advised.

## 2018-12-25 ENCOUNTER — Other Ambulatory Visit: Payer: Self-pay | Admitting: Family Medicine

## 2018-12-25 DIAGNOSIS — F5101 Primary insomnia: Secondary | ICD-10-CM

## 2018-12-25 NOTE — Telephone Encounter (Signed)
Routing to pcp for signature.Marland KitchenMarland KitchenAudelia Hives [place]

## 2019-01-02 ENCOUNTER — Other Ambulatory Visit: Payer: Self-pay | Admitting: Family Medicine

## 2019-01-17 DIAGNOSIS — H5213 Myopia, bilateral: Secondary | ICD-10-CM | POA: Diagnosis not present

## 2019-02-02 ENCOUNTER — Other Ambulatory Visit: Payer: Self-pay | Admitting: Family Medicine

## 2019-03-04 ENCOUNTER — Other Ambulatory Visit: Payer: Self-pay | Admitting: Family Medicine

## 2019-06-01 ENCOUNTER — Other Ambulatory Visit: Payer: Self-pay | Admitting: Family Medicine

## 2019-06-01 ENCOUNTER — Other Ambulatory Visit: Payer: Self-pay | Admitting: *Deleted

## 2019-06-02 ENCOUNTER — Other Ambulatory Visit: Payer: Self-pay | Admitting: Family Medicine

## 2019-07-10 ENCOUNTER — Other Ambulatory Visit: Payer: Self-pay | Admitting: Family Medicine

## 2019-07-15 ENCOUNTER — Encounter: Payer: Self-pay | Admitting: Family Medicine

## 2019-07-15 MED ORDER — PANTOPRAZOLE SODIUM 20 MG PO TBEC: 20.0000 mg | DELAYED_RELEASE_TABLET | Freq: Every day | ORAL | 1 refills | Status: DC

## 2019-09-22 ENCOUNTER — Other Ambulatory Visit: Payer: Self-pay | Admitting: Family Medicine

## 2019-09-22 DIAGNOSIS — Z23 Encounter for immunization: Secondary | ICD-10-CM | POA: Diagnosis not present

## 2019-09-22 DIAGNOSIS — Z1231 Encounter for screening mammogram for malignant neoplasm of breast: Secondary | ICD-10-CM

## 2019-10-06 ENCOUNTER — Encounter: Payer: Self-pay | Admitting: Family Medicine

## 2019-10-06 ENCOUNTER — Other Ambulatory Visit: Payer: Self-pay

## 2019-10-06 ENCOUNTER — Ambulatory Visit (INDEPENDENT_AMBULATORY_CARE_PROVIDER_SITE_OTHER): Payer: Medicare HMO | Admitting: Family Medicine

## 2019-10-06 VITALS — BP 122/78 | HR 62 | Ht 62.0 in | Wt 147.0 lb

## 2019-10-06 DIAGNOSIS — R69 Illness, unspecified: Secondary | ICD-10-CM | POA: Diagnosis not present

## 2019-10-06 DIAGNOSIS — E7800 Pure hypercholesterolemia, unspecified: Secondary | ICD-10-CM

## 2019-10-06 DIAGNOSIS — M7061 Trochanteric bursitis, right hip: Secondary | ICD-10-CM | POA: Diagnosis not present

## 2019-10-06 DIAGNOSIS — F5101 Primary insomnia: Secondary | ICD-10-CM | POA: Diagnosis not present

## 2019-10-06 DIAGNOSIS — R0789 Other chest pain: Secondary | ICD-10-CM | POA: Diagnosis not present

## 2019-10-06 DIAGNOSIS — I10 Essential (primary) hypertension: Secondary | ICD-10-CM | POA: Diagnosis not present

## 2019-10-06 MED ORDER — ZOLPIDEM TARTRATE 10 MG PO TABS: 5.0000 mg | ORAL_TABLET | Freq: Every day | ORAL | 0 refills | Status: DC

## 2019-10-06 NOTE — Progress Notes (Signed)
Subjective:     Deborah Mccullough is a 68 y.o. female and is here for a comprehensive physical exam. The patient reports problems - yes.  She did have a couple of concerns that she wanted to discuss today.  She will be for due for a tetanus vaccine in December.  Her pneumonia vaccine and flu vaccines are up-to-date.  Her mammogram is scheduled for next month.  And her colonoscopy is up-to-date.  Is okay with getting blood work drawn today.  She reports that she does walk for exercise.  Denies depressive symptoms.  Does report some hearing loss.  She has been having some tightness in her chest.  Is been going on for several months.  She says been a little bit better the last few weeks but was pretty bad for about 2 months.  She did not seek medical care for it.  She says it would hurt to sneeze and sometimes even at night she would wake up and feel the discomfort in her chest.  She denies any GERD symptoms.  Ports she has been taking her reflux medicine regularly but recently had decrease it to every other day because of some long-term side effects that she saw and she was concerned about.  She would like to have an EKG done if possible. No SOB.    Also complains of Right hip pain.  She says been painful to sleep on that hip and bothers her from time to time.  Sometimes she will even notice some tingling and numbness in her right heel and wonders if the 2 things could be related.  She has not tried any specific treatments for either area.  Also has a few skin lesions particularly under her left breast that she would like me to look at today.  They get caught on the clothing and they are little bit bothersome but she wants to make sure that they look okay.  Reports that she occasionally has some pelvic pressure and discomfort.  She denies any dysuria or urinary symptoms.  Denies any blood in the urine.  Denies any abnormal vaginal discharge.  She says it really just seems to come and go.  She wonders if she  might be getting is a little bit of mild uterine prolapse.  Social History   Socioeconomic History  . Marital status: Married    Spouse name: Not on file  . Number of children: 1  . Years of education: Not on file  . Highest education level: Not on file  Occupational History  . 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 Financial resource strain: Not on file  . Food insecurity    Worry: Not on file    Inability: Not on file  . Transportation needs    Medical: Not on file    Non-medical: Not on file  Tobacco Use  . Smoking status: Former Smoker    Types: Cigarettes  . Smokeless tobacco: Never Used  . Tobacco comment: smoked in her 20's  Substance and Sexual Activity  . Alcohol use: No    Alcohol/week: 0.0 standard drinks  . Drug use: No  . Sexual activity: Not on file  Lifestyle  . Physical activity    Days per week: Not on file    Minutes per session: Not on file  . Stress: Not on file  Relationships  . Social Herbalist on phone: Not on file    Gets together:  Not on file    Attends religious service: Not on file    Active member of club or organization: Not on file    Attends meetings of clubs or organizations: Not on file    Relationship status: Not on file  . Intimate partner violence    Fear of current or ex partner: Not on file    Emotionally abused: Not on file    Physically abused: Not on file    Forced sexual activity: Not on file  Other Topics Concern  . Not on file  Social History Narrative   2 cups caffeine daily. Tries to walk for exercise, about 3 per week.    Health Maintenance  Topic Date Due  . TETANUS/TDAP  12/16/2019  . MAMMOGRAM  10/28/2020  . COLONOSCOPY  01/11/2025  . INFLUENZA VACCINE  Completed  . DEXA SCAN  Completed  . Hepatitis C Screening  Completed  . PNA vac Low Risk Adult  Completed    The following portions of the patient's history were reviewed and updated as appropriate:  allergies, current medications, past family history, past medical history, past social history, past surgical history and problem list.  Review of Systems A comprehensive review of systems was negative.   Objective:    BP 122/78   Pulse 62   Ht 5\' 2"  (1.575 m)   Wt 147 lb (66.7 kg)   SpO2 99%   BMI 26.89 kg/m  General appearance: alert, cooperative and appears stated age Head: Normocephalic, without obvious abnormality, atraumatic Eyes: conj clear, EOM, PEERLA Ears: normal TM's and external ear canals both ears Nose: Nares normal. Septum midline. Mucosa normal. No drainage or sinus tenderness. Throat: lips, mucosa, and tongue normal; teeth and gums normal Neck: no adenopathy, no carotid bruit, no JVD, supple, symmetrical, trachea midline and thyroid not enlarged, symmetric, no tenderness/mass/nodules Back: symmetric, no curvature. ROM normal. No CVA tenderness. Lungs: clear to auscultation bilaterally Breasts: normal appearance, no masses or tenderness Heart: regular rate and rhythm, S1, S2 normal, no murmur, click, rub or gallop Abdomen: soft, non-tender; bowel sounds normal; no masses,  no organomegaly Extremities: extremities normal, atraumatic, no cyanosis or edema Pulses: 2+ and symmetric Skin: Skin color, texture, turgor normal. No rashes or lesions or He does have several seborrheic keratoses on her torso and abdomen and below the breast area. Lymph nodes: Cervical, supraclavicular, and axillary nodes normal. Neurologic: Alert and oriented X 3, normal strength and tone. Normal symmetric reflexes. Normal coordination and gait    Chest wall-she is tender over the right upper anterior chest wall just lateral to the breastbone.  Right hip she is very tender over the iliac crest as well as the right greater trochanteric bursa.  Normal range of motion of the hip.  Patellar reflexes 2+.   Assessment:    Healthy female exam.      Plan:     See After Visit Summary for  Counseling Recommendations   Keep up a regular exercise program and make sure you are eating a healthy diet Try to eat 4 servings of dairy a day, or if you are lactose intolerant take a calcium with vitamin D daily.  Your vaccines are up to date.   Atypical chest pain-it sounds more musculoskeletal in nature versus cardiac.  Is not necessarily aggravated with activity but can be worse with things like sneezing and position change.  We will go ahead and get EKG today.  EKG today shows rate of 60 bpm, normal sinus rhythm  with no acute ST-T wave changes.  Suspect there may be some component of costochondritis.  Somewhat her pain has been gradually getting a little better.consider chest x-ray as well though no shortness of breath associated with her symptoms.   Right hip pain-most suspicious for trochanteric bursitis.  Handout given to do home rehab exercises if she is not improving then please let me know and will consider formal PT and possible injection.  For follow back up in 6 weeks to see if she is improving.  Pain/pressure-we did not do further work-up today since she does not currently having any symptoms but certainly I be happy to see her back for these concerns and to do an exam.

## 2019-10-06 NOTE — Patient Instructions (Signed)
Health Maintenance After Age 70 After age 70, you are at a higher risk for certain long-term diseases and infections as well as injuries from falls. Falls are a major cause of broken bones and head injuries in people who are older than age 70. Getting regular preventive care can help to keep you healthy and well. Preventive care includes getting regular testing and making lifestyle changes as recommended by your health care provider. Talk with your health care provider about:  Which screenings and tests you should have. A screening is a test that checks for a disease when you have no symptoms.  A diet and exercise plan that is right for you. What should I know about screenings and tests to prevent falls? Screening and testing are the best ways to find a health problem early. Early diagnosis and treatment give you the best chance of managing medical conditions that are common after age 70. Certain conditions and lifestyle choices may make you more likely to have a fall. Your health care provider may recommend:  Regular vision checks. Poor vision and conditions such as cataracts can make you more likely to have a fall. If you wear glasses, make sure to get your prescription updated if your vision changes.  Medicine review. Work with your health care provider to regularly review all of the medicines you are taking, including over-the-counter medicines. Ask your health care provider about any side effects that may make you more likely to have a fall. Tell your health care provider if any medicines that you take make you feel dizzy or sleepy.  Osteoporosis screening. Osteoporosis is a condition that causes the bones to get weaker. This can make the bones weak and cause them to break more easily.  Blood pressure screening. Blood pressure changes and medicines to control blood pressure can make you feel dizzy.  Strength and balance checks. Your health care provider may recommend certain tests to check your  strength and balance while standing, walking, or changing positions.  Foot health exam. Foot pain and numbness, as well as not wearing proper footwear, can make you more likely to have a fall.  Depression screening. You may be more likely to have a fall if you have a fear of falling, feel emotionally low, or feel unable to do activities that you used to do.  Alcohol use screening. Using too much alcohol can affect your balance and may make you more likely to have a fall. What actions can I take to lower my risk of falls? General instructions  Talk with your health care provider about your risks for falling. Tell your health care provider if: ? You fall. Be sure to tell your health care provider about all falls, even ones that seem minor. ? You feel dizzy, sleepy, or off-balance.  Take over-the-counter and prescription medicines only as told by your health care provider. These include any supplements.  Eat a healthy diet and maintain a healthy weight. A healthy diet includes low-fat dairy products, low-fat (lean) meats, and fiber from whole grains, beans, and lots of fruits and vegetables. Home safety  Remove any tripping hazards, such as rugs, cords, and clutter.  Install safety equipment such as grab bars in bathrooms and safety rails on stairs.  Keep rooms and walkways well-lit. Activity   Follow a regular exercise program to stay fit. This will help you maintain your balance. Ask your health care provider what types of exercise are appropriate for you.  If you need a cane or   walker, use it as recommended by your health care provider.  Wear supportive shoes that have nonskid soles. Lifestyle  Do not drink alcohol if your health care provider tells you not to drink.  If you drink alcohol, limit how much you have: ? 0-1 drink a day for women. ? 0-2 drinks a day for men.  Be aware of how much alcohol is in your drink. In the [place] one drink equals one typical bottle of beer (12  oz), one-half glass of wine (5 oz), or one shot of hard liquor (1 oz).  Do not use any products that contain nicotine or tobacco, such as cigarettes and e-cigarettes. If you need help quitting, ask your health care provider. Summary  Having a healthy lifestyle and getting preventive care can help to protect your health and wellness after age 71.  Screening and testing are the best way to find a health problem early and help you avoid having a fall. Early diagnosis and treatment give you the best chance for managing medical conditions that are more common for people who are older than age 32.  Falls are a major cause of broken bones and head injuries in people who are older than age 66. Take precautions to prevent a fall at home.  Work with your health care provider to learn what changes you can make to improve your health and wellness and to prevent falls. This information is not intended to replace advice given to you by your health care provider. Make sure you discuss any questions you have with your health care provider. Document Released: 10/16/2017 Document Revised: 03/26/2019 Document Reviewed: 10/16/2017 Elsevier Patient Education  2020 [employer].  Hip Bursitis Rehab Ask your health care provider which exercises are safe for you. Do exercises exactly as told by your health care provider and adjust them as directed. It is normal to feel mild stretching, pulling, tightness, or discomfort as you do these exercises. Stop right away if you feel sudden pain or your pain gets worse. Do not begin these exercises until told by your health care provider. Stretching exercise This exercise warms up your muscles and joints and improves the movement and flexibility of your hip. This exercise also helps to relieve pain and stiffness. Iliotibial band stretch An iliotibial band is a strong band of muscle tissue that runs from the outer side of your hip to the outer side of your thigh and knee. 1.  Lie on your side with your left / right leg in the top position. 2. Bend your left / right knee and grab your ankle. Stretch out your bottom arm to help you balance. 3. Slowly bring your knee back so your thigh is behind your body. 4. Slowly lower your knee toward the floor until you feel a gentle stretch on the outside of your left / right thigh. If you do not feel a stretch and your knee will not fall farther, place the heel of your other foot on top of your knee and pull your knee down toward the floor with your foot. 5. Hold this position for __________ seconds. 6. Slowly return to the starting position. Repeat __________ times. Complete this exercise __________ times a day. Strengthening exercises These exercises build strength and endurance in your hip and pelvis. Endurance is the ability to use your muscles for a long time, even after they get tired. Bridge This exercise strengthens the muscles that move your thigh backward (hip extensors). 1. Lie on your back on a  firm surface with your knees bent and your feet flat on the floor. 2. Tighten your buttocks muscles and lift your buttocks off the floor until your trunk is level with your thighs. ? Do not arch your back. ? You should feel the muscles working in your buttocks and the back of your thighs. If you do not feel these muscles, slide your feet 1-2 inches (2.5-5 cm) farther away from your buttocks. ? If this exercise is too easy, try doing it with your arms crossed over your chest. 3. Hold this position for __________ seconds. 4. Slowly lower your hips to the starting position. 5. Let your muscles relax completely after each repetition. Repeat __________ times. Complete this exercise __________ times a day. Squats This exercise strengthens the muscles in front of your thigh and knee (quadriceps). 1. Stand in front of a table, with your feet and knees pointing straight ahead. You may rest your hands on the table for balance but not  for support. 2. Slowly bend your knees and lower your hips like you are going to sit in a chair. ? Keep your weight over your heels, not over your toes. ? Keep your lower legs upright so they are parallel with the table legs. ? Do not let your hips go lower than your knees. ? Do not bend lower than told by your health care provider. ? If your hip pain increases, do not bend as low. 3. Hold the squat position for __________ seconds. 4. Slowly push with your legs to return to standing. Do not use your hands to pull yourself to standing. Repeat __________ times. Complete this exercise __________ times a day. Hip hike 1. Stand sideways on a bottom step. Stand on your left / right leg with your other foot unsupported next to the step. You can hold on to the railing or wall for balance if needed. 2. Keep your knees straight and your torso square. Then lift your left / right hip up toward the ceiling. 3. Hold this position for __________ seconds. 4. Slowly let your left / right hip lower toward the floor, past the starting position. Your foot should get closer to the floor. Do not lean or bend your knees. Repeat __________ times. Complete this exercise __________ times a day. Single leg stand 1. Without shoes, stand near a railing or in a doorway. You may hold on to the railing or door frame as needed for balance. 2. Squeeze your left / right buttock muscles, then lift up your other foot. ? Do not let your left / right hip push out to the side. ? It is helpful to stand in front of a mirror for this exercise so you can watch your hip. 3. Hold this position for __________ seconds. Repeat __________ times. Complete this exercise __________ times a day. This information is not intended to replace advice given to you by your health care provider. Make sure you discuss any questions you have with your health care provider. Document Released: 01/10/2005 Document Revised: 03/30/2019 Document Reviewed:  03/30/2019 Elsevier Patient Education  2020 [employer].

## 2019-10-07 LAB — COMPLETE METABOLIC PANEL WITH GFR
AG Ratio: 1.7 (calc) (ref 1.0–2.5)
ALT: 16 U/L (ref 6–29)
AST: 25 U/L (ref 10–35)
Albumin: 4.3 g/dL (ref 3.6–5.1)
Alkaline phosphatase (APISO): 69 U/L (ref 37–153)
BUN: 14 mg/dL (ref 7–25)
CO2: 30 mmol/L (ref 20–32)
Calcium: 9.6 mg/dL (ref 8.6–10.4)
Chloride: 99 mmol/L (ref 98–110)
Creat: 0.8 mg/dL (ref 0.50–0.99)
GFR, Est African American: 87 mL/min/{1.73_m2} (ref >=60)
GFR, Est Non African American: 75 mL/min/{1.73_m2} (ref >=60)
Globulin: 2.5 g/dL (calc) (ref 1.9–3.7)
Glucose, Bld: 88 mg/dL (ref 65–99)
Potassium: 4.2 mmol/L (ref 3.5–5.3)
Sodium: 137 mmol/L (ref 135–146)
Total Bilirubin: 0.6 mg/dL (ref 0.2–1.2)
Total Protein: 6.8 g/dL (ref 6.1–8.1)

## 2019-10-07 LAB — LIPID PANEL W/REFLEX DIRECT LDL
Cholesterol: 156 mg/dL (ref <200)
HDL: 48 mg/dL — ABNORMAL LOW (ref >=50)
LDL Cholesterol (Calc): 90 mg/dL (calc)
Non-HDL Cholesterol (Calc): 108 mg/dL (calc) (ref <130)
Total CHOL/HDL Ratio: 3.3 (calc) (ref <5.0)
Triglycerides: 85 mg/dL (ref <150)

## 2019-10-07 LAB — CBC
HCT: 38.1 % (ref 35.0–45.0)
Hemoglobin: 13 g/dL (ref 11.7–15.5)
MCH: 30 pg (ref 27.0–33.0)
MCHC: 34.1 g/dL (ref 32.0–36.0)
MCV: 87.8 fL (ref 80.0–100.0)
MPV: 9.3 fL (ref 7.5–12.5)
Platelets: 250 10*3/uL (ref 140–400)
RBC: 4.34 10*6/uL (ref 3.80–5.10)
RDW: 12.3 % (ref 11.0–15.0)
WBC: 4.2 10*3/uL (ref 3.8–10.8)

## 2019-10-07 NOTE — Progress Notes (Signed)
All labs are normal. 

## 2019-10-08 DIAGNOSIS — R69 Illness, unspecified: Secondary | ICD-10-CM | POA: Diagnosis not present

## 2019-10-10 ENCOUNTER — Other Ambulatory Visit: Payer: Self-pay | Admitting: Family Medicine

## 2019-11-06 ENCOUNTER — Other Ambulatory Visit: Payer: Self-pay

## 2019-11-06 ENCOUNTER — Ambulatory Visit
Admission: RE | Admit: 2019-11-06 | Discharge: 2019-11-06 | Disposition: A | Discharge status: Home / Self Care | Payer: Medicare HMO | Source: Ambulatory Visit | Attending: Family Medicine | Admitting: Family Medicine

## 2019-11-06 DIAGNOSIS — Z1231 Encounter for screening mammogram for malignant neoplasm of breast: Secondary | ICD-10-CM | POA: Diagnosis not present

## 2019-11-19 ENCOUNTER — Ambulatory Visit: Payer: Medicare HMO | Admitting: Family Medicine

## 2019-11-24 ENCOUNTER — Ambulatory Visit (INDEPENDENT_AMBULATORY_CARE_PROVIDER_SITE_OTHER): Payer: Medicare HMO | Admitting: Family Medicine

## 2019-11-24 ENCOUNTER — Other Ambulatory Visit: Payer: Self-pay

## 2019-11-24 ENCOUNTER — Ambulatory Visit (INDEPENDENT_AMBULATORY_CARE_PROVIDER_SITE_OTHER): Payer: Medicare HMO

## 2019-11-24 ENCOUNTER — Encounter: Payer: Self-pay | Admitting: Family Medicine

## 2019-11-24 VITALS — BP 135/56 | HR 69 | Ht 62.0 in | Wt 148.0 lb

## 2019-11-24 DIAGNOSIS — M79671 Pain in right foot: Secondary | ICD-10-CM | POA: Diagnosis not present

## 2019-11-24 DIAGNOSIS — M25551 Pain in right hip: Secondary | ICD-10-CM | POA: Diagnosis not present

## 2019-11-24 DIAGNOSIS — M5416 Radiculopathy, lumbar region: Secondary | ICD-10-CM | POA: Insufficient documentation

## 2019-11-24 DIAGNOSIS — M545 Low back pain: Secondary | ICD-10-CM | POA: Diagnosis not present

## 2019-11-24 DIAGNOSIS — M7061 Trochanteric bursitis, right hip: Secondary | ICD-10-CM

## 2019-11-24 NOTE — Assessment & Plan Note (Signed)
I do think some of her pain in the buttock area and into her heel is related to her back. Will get plain film xray. Will call with results. Would benefit from PT.

## 2019-11-24 NOTE — Assessment & Plan Note (Signed)
Most consistent with trochanteric bursitis. Recommend formal PT. She is nervous about COVID.
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
## 2019-12-04 ENCOUNTER — Other Ambulatory Visit: Payer: Self-pay | Admitting: Family Medicine

## 2019-12-22 DIAGNOSIS — Z0100 Encounter for examination of eyes and vision without abnormal findings: Secondary | ICD-10-CM | POA: Diagnosis not present

## 2019-12-22 DIAGNOSIS — H524 Presbyopia: Secondary | ICD-10-CM | POA: Diagnosis not present

## 2019-12-22 DIAGNOSIS — H2513 Age-related nuclear cataract, bilateral: Secondary | ICD-10-CM | POA: Diagnosis not present

## 2019-12-22 DIAGNOSIS — H5213 Myopia, bilateral: Secondary | ICD-10-CM | POA: Diagnosis not present

## 2020-01-06 ENCOUNTER — Ambulatory Visit: Payer: Medicare Other | Attending: Internal Medicine

## 2020-01-06 DIAGNOSIS — Z23 Encounter for immunization: Secondary | ICD-10-CM

## 2020-01-06 NOTE — Progress Notes (Signed)
   Covid-19 Vaccination Clinic  Name:  Deborah Mccullough    MRN: [id number] DOB: [birth year]  01/06/2020  [name] was observed post Covid-19 immunization for 15 minutes without incidence. She was provided with Vaccine Information Sheet and instruction to access the V-Safe system.   [name] was instructed to call 911 with any severe reactions post vaccine: Marland Kitchen Difficulty breathing  . Swelling of your face and throat  . A fast heartbeat  . A bad rash all over your body  . Dizziness and weakness    Immunizations Administered    Name Date Dose VIS Date Route   Pfizer COVID-19 Vaccine 01/06/2020 11:32 AM 0.3 mL 11/27/2019 Intramuscular   Manufacturer: [place]   Lot: [id number]   [place]: [id number]

## 2020-01-27 ENCOUNTER — Ambulatory Visit: Payer: Medicare HMO | Attending: Internal Medicine

## 2020-01-27 DIAGNOSIS — Z23 Encounter for immunization: Secondary | ICD-10-CM | POA: Insufficient documentation

## 2020-01-27 NOTE — Progress Notes (Signed)
   Covid-19 Vaccination Clinic  Name:  Deborah Mccullough    MRN: [id number] DOB: 1951/06/06  01/27/2020  [name] was observed post Covid-19 immunization for 15 minutes without incidence. She was provided with Vaccine Information Sheet and instruction to access the V-Safe system.   Deborah Mccullough was instructed to call 911 with any severe reactions post vaccine: Marland Kitchen Difficulty breathing  . Swelling of your face and throat  . A fast heartbeat  . A bad rash all over your body  . Dizziness and weakness    Immunizations Administered    Name Date Dose VIS Date Route   Pfizer COVID-19 Vaccine 01/27/2020  3:40 PM 0.3 mL 11/27/2019 Intramuscular   Manufacturer: [employer]   Lot: [id number]   [place]: [id number]

## 2020-03-26 ENCOUNTER — Other Ambulatory Visit: Payer: Self-pay | Admitting: Family Medicine

## 2020-03-26 DIAGNOSIS — F5101 Primary insomnia: Secondary | ICD-10-CM

## 2020-03-28 NOTE — Telephone Encounter (Signed)
LVM asking that she rtn call to schedule an appt for f/u on BP and insomnia.  Informed her that her Ambien was sent for a 30 day supply.

## 2020-03-28 NOTE — Telephone Encounter (Signed)
Please call patient remind her that she is due for 50-month follow-up for her blood pressure and sleep medication.  I did go ahead and refill her Ambien for 30 days that we will give her a month to get in and make an appointment.

## 2020-03-29 ENCOUNTER — Telehealth: Payer: Self-pay | Admitting: Family Medicine

## 2020-03-29 NOTE — Telephone Encounter (Signed)
Received fax for [place] on Zolpidem Tartrate sent through cover my meds waiting on determination. - CF

## 2020-04-05 NOTE — Telephone Encounter (Signed)
Received fax from [facility] and they approved Zolpidem Tartrate from 12/18/19 - 12/16/20. - CF

## 2020-04-07 DIAGNOSIS — R69 Illness, unspecified: Secondary | ICD-10-CM | POA: Diagnosis not present

## 2020-04-14 ENCOUNTER — Other Ambulatory Visit: Payer: Self-pay | Admitting: Family Medicine

## 2020-04-20 ENCOUNTER — Other Ambulatory Visit: Payer: Self-pay

## 2020-04-20 ENCOUNTER — Encounter: Payer: Self-pay | Admitting: Family Medicine

## 2020-04-20 ENCOUNTER — Ambulatory Visit (INDEPENDENT_AMBULATORY_CARE_PROVIDER_SITE_OTHER): Payer: Medicare HMO | Admitting: Family Medicine

## 2020-04-20 VITALS — BP 129/55 | HR 60 | Ht 62.0 in | Wt 146.0 lb

## 2020-04-20 DIAGNOSIS — G43909 Migraine, unspecified, not intractable, without status migrainosus: Secondary | ICD-10-CM

## 2020-04-20 DIAGNOSIS — I10 Essential (primary) hypertension: Secondary | ICD-10-CM

## 2020-04-20 DIAGNOSIS — R5383 Other fatigue: Secondary | ICD-10-CM

## 2020-04-20 DIAGNOSIS — F5101 Primary insomnia: Secondary | ICD-10-CM

## 2020-04-20 DIAGNOSIS — R7309 Other abnormal glucose: Secondary | ICD-10-CM | POA: Diagnosis not present

## 2020-04-20 DIAGNOSIS — R69 Illness, unspecified: Secondary | ICD-10-CM | POA: Diagnosis not present

## 2020-04-20 MED ORDER — ZOLPIDEM TARTRATE 5 MG PO TABS: 5.0000 mg | ORAL_TABLET | Freq: Every day | ORAL | 0 refills | Status: DC

## 2020-04-20 NOTE — Assessment & Plan Note (Signed)
Well controlled.  Not currently on prophylaxis.

## 2020-04-20 NOTE — Progress Notes (Signed)
She reports that she has been feeling tired and having low energy.  She also has started a Probiotic she takes this PRN. And she also is weaning off of the Omeprazole.   She asked about whether or not she should continue to take Aspirin daily I advised her that I didn't see a reason for her to continue taking this and that she could discontinue if she wanted to.   Still experiencing some abdominal cramping she uses a heating pad and IBU and this helps with the pain.

## 2020-04-20 NOTE — Assessment & Plan Note (Signed)
We did discuss switching to the 5 mg and she is already splitting the 10 mg.  The cough should be relatively similar.  When she is due for her next refill we can have the pharmacy process it is the 5 mg tab instead.

## 2020-04-20 NOTE — Assessment & Plan Note (Signed)
Well controlled. Continue current regimen. Follow up in  6 mo  
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
## 2020-04-21 LAB — HEMOGLOBIN A1C
Hgb A1c MFr Bld: 5.4 % of total Hgb (ref <5.7)
Mean Plasma Glucose: 108 (calc)
eAG (mmol/L): 6 (calc)

## 2020-04-21 LAB — BASIC METABOLIC PANEL WITH GFR
BUN: 14 mg/dL (ref 7–25)
CO2: 28 mmol/L (ref 20–32)
Calcium: 9.8 mg/dL (ref 8.6–10.4)
Chloride: 99 mmol/L (ref 98–110)
Creat: 0.78 mg/dL (ref 0.60–0.93)
GFR, Est African American: 89 mL/min/{1.73_m2} (ref >=60)
GFR, Est Non African American: 77 mL/min/{1.73_m2} (ref >=60)
Glucose, Bld: 102 mg/dL — ABNORMAL HIGH (ref 65–99)
Potassium: 4.7 mmol/L (ref 3.5–5.3)
Sodium: 134 mmol/L — ABNORMAL LOW (ref 135–146)

## 2020-04-21 LAB — VITAMIN B12: Vitamin B-12: 840 pg/mL (ref 200–1100)

## 2020-04-21 LAB — TSH: TSH: 1.55 mIU/L (ref 0.40–4.50)

## 2020-04-21 LAB — IRON: Iron: 97 ug/dL (ref 45–160)

## 2020-04-22 ENCOUNTER — Other Ambulatory Visit: Payer: Self-pay | Admitting: Family Medicine

## 2020-06-20 DIAGNOSIS — H527 Unspecified disorder of refraction: Secondary | ICD-10-CM | POA: Diagnosis not present

## 2020-09-04 ENCOUNTER — Other Ambulatory Visit: Payer: Self-pay | Admitting: Family Medicine

## 2020-09-04 DIAGNOSIS — F5101 Primary insomnia: Secondary | ICD-10-CM

## 2020-09-14 ENCOUNTER — Ambulatory Visit (INDEPENDENT_AMBULATORY_CARE_PROVIDER_SITE_OTHER): Payer: Medicare HMO | Admitting: Physician Assistant

## 2020-09-14 ENCOUNTER — Telehealth: Payer: Self-pay | Admitting: *Deleted

## 2020-09-14 VITALS — Temp 98.5°F

## 2020-09-14 DIAGNOSIS — Z23 Encounter for immunization: Secondary | ICD-10-CM

## 2020-09-14 NOTE — Telephone Encounter (Signed)
Pt was here today for her flu shot. As her medications were being reviewed she stated that she has really been feeling fatigued and questions whether this is coming from the HCTZ or if it is due to her being so anxious and she also c/o having muscle aches. She also restarted taking an 81 mg ASA and wanted to know if she should be taking this. She said that she had been talking to some friends that were once RN's and they told her that she should be taking this because she is old. I did let her know that it would be her choice as to whether or not she wanted to continue taking the aspirin or not.  I told her that the muscle aches could be coming from the Atorvastatin. I asked if she noticed if when she was on the 40 mg did she notice the aches she said that she couldn't remember. She does take this every night as directed along with the metoprolol. She asked if this was the way that it should be taken? I told her that it was.   Advised pt that she may need to try taking the Atorvastatin every other night to see if there is a noticeable difference in the muscle aches. I told her that I would send her concerns to Dr. Madilyn Fireman. She does have a f/u on 10/26/2020.  When the pt was last seen on 04/20/20 she spoke of feeling tired and having low energy at that time in addition to whether or not to continue taking the 81 mg aspirin.  Can either respond by my chart or phone call.

## 2020-09-14 NOTE — Telephone Encounter (Signed)
Agree trying the atorvastatin every other night for the next month to see if she feels like that is helpful.  Since she has several concerns going on I am more than happy to move her appointment up a couple of weeks.  She does not have any risk factors that currently warrant aspirin therapy so I would not necessarily recommend it.

## 2020-09-14 NOTE — Progress Notes (Signed)
Patient here today for a flu shot.  Tolerated well.

## 2020-09-15 ENCOUNTER — Encounter: Payer: Self-pay | Admitting: *Deleted

## 2020-09-15 NOTE — Telephone Encounter (Signed)
Spoke w/pt's husband and he stated that she wasn't home and stated that he will have her to rtn call about recommendations. I told him that I would put Dr. Gardiner Ramus recommendations in a pt message for her review in my chart.

## 2020-10-07 ENCOUNTER — Other Ambulatory Visit: Payer: Self-pay | Admitting: Family Medicine

## 2020-10-07 DIAGNOSIS — Z1231 Encounter for screening mammogram for malignant neoplasm of breast: Secondary | ICD-10-CM

## 2020-10-11 DIAGNOSIS — R69 Illness, unspecified: Secondary | ICD-10-CM | POA: Diagnosis not present

## 2020-10-19 ENCOUNTER — Other Ambulatory Visit: Payer: Self-pay | Admitting: Family Medicine

## 2020-10-26 ENCOUNTER — Encounter: Payer: Self-pay | Admitting: Family Medicine

## 2020-10-26 ENCOUNTER — Ambulatory Visit (INDEPENDENT_AMBULATORY_CARE_PROVIDER_SITE_OTHER): Payer: Medicare HMO | Admitting: Family Medicine

## 2020-10-26 ENCOUNTER — Other Ambulatory Visit: Payer: Self-pay

## 2020-10-26 VITALS — BP 124/60 | HR 63 | Ht 62.0 in | Wt 149.0 lb

## 2020-10-26 DIAGNOSIS — R5383 Other fatigue: Secondary | ICD-10-CM

## 2020-10-26 DIAGNOSIS — E7800 Pure hypercholesterolemia, unspecified: Secondary | ICD-10-CM

## 2020-10-26 DIAGNOSIS — I10 Essential (primary) hypertension: Secondary | ICD-10-CM

## 2020-10-26 DIAGNOSIS — F5101 Primary insomnia: Secondary | ICD-10-CM

## 2020-10-26 DIAGNOSIS — F411 Generalized anxiety disorder: Secondary | ICD-10-CM

## 2020-10-26 DIAGNOSIS — N760 Acute vaginitis: Secondary | ICD-10-CM

## 2020-10-26 DIAGNOSIS — R69 Illness, unspecified: Secondary | ICD-10-CM | POA: Diagnosis not present

## 2020-10-26 LAB — TSH: TSH: 1.44 mIU/L (ref 0.40–4.50)

## 2020-10-26 LAB — WET PREP FOR TRICH, YEAST, CLUE
MICRO NUMBER:: 11185389
Specimen Quality: ADEQUATE

## 2020-10-26 LAB — LIPID PANEL
Cholesterol: 176 mg/dL (ref <200)
HDL: 50 mg/dL (ref >=50)
LDL Cholesterol (Calc): 107 mg/dL (calc) — ABNORMAL HIGH
Non-HDL Cholesterol (Calc): 126 mg/dL (calc) (ref <130)
Total CHOL/HDL Ratio: 3.5 (calc) (ref <5.0)
Triglycerides: 96 mg/dL (ref <150)

## 2020-10-26 LAB — COMPLETE METABOLIC PANEL WITH GFR
AG Ratio: 1.8 (calc) (ref 1.0–2.5)
ALT: 16 U/L (ref 6–29)
AST: 21 U/L (ref 10–35)
Albumin: 4.7 g/dL (ref 3.6–5.1)
Alkaline phosphatase (APISO): 65 U/L (ref 37–153)
BUN: 17 mg/dL (ref 7–25)
CO2: 31 mmol/L (ref 20–32)
Calcium: 9.9 mg/dL (ref 8.6–10.4)
Chloride: 100 mmol/L (ref 98–110)
Creat: 0.76 mg/dL (ref 0.60–0.93)
GFR, Est African American: 92 mL/min/{1.73_m2} (ref >=60)
GFR, Est Non African American: 79 mL/min/{1.73_m2} (ref >=60)
Globulin: 2.6 g/dL (calc) (ref 1.9–3.7)
Glucose, Bld: 94 mg/dL (ref 65–99)
Potassium: 4.2 mmol/L (ref 3.5–5.3)
Sodium: 137 mmol/L (ref 135–146)
Total Bilirubin: 0.5 mg/dL (ref 0.2–1.2)
Total Protein: 7.3 g/dL (ref 6.1–8.1)

## 2020-10-26 LAB — CBC
HCT: 40.1 % (ref 35.0–45.0)
Hemoglobin: 13.7 g/dL (ref 11.7–15.5)
MCH: 30.6 pg (ref 27.0–33.0)
MCHC: 34.2 g/dL (ref 32.0–36.0)
MCV: 89.7 fL (ref 80.0–100.0)
MPV: 9 fL (ref 7.5–12.5)
Platelets: 246 10*3/uL (ref 140–400)
RBC: 4.47 10*6/uL (ref 3.80–5.10)
RDW: 12.5 % (ref 11.0–15.0)
WBC: 4 10*3/uL (ref 3.8–10.8)

## 2020-10-26 MED ORDER — SERTRALINE HCL 50 MG PO TABS: ORAL_TABLET | ORAL | 1 refills | Status: DC

## 2020-10-26 NOTE — Patient Instructions (Addendum)
Hold your hydrochlorothiazide for 7 to 10 days just to see if you feel better.  If you do feel better then please let me know and I will find a substitute for your blood pressure.  Just keep an eye on your blood pressure while you are holding the medicine.  If your blood pressure is 170 or higher then please call me back so that we can go ahead and try something different.  If you do not notice a difference after stopping the medication and your fatigue and energy levels then please restart it.  Also consider that we could look at doing a combination pill for your blood pressure if you would prefer that.  We will start sertraline daily for your anxiety.  We will need to see you back in about 3 to 4 weeks, before your next refill to make sure that you are doing well and to make any adjustments.
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
## 2020-10-26 NOTE — Assessment & Plan Note (Addendum)
Well controlled.   Hold your hydrochlorothiazide for 7 to 10 days just to see if you feel better.  If you do feel better then please let me know and I will find a substitute for your blood pressure.  Just keep an eye on your blood pressure while you are holding the medicine.  If your blood pressure is 170 or higher then please call me back so that we can go ahead and try something different.  If you do not notice a difference after stopping the medication and your fatigue and energy levels then please restart it.   Follow up in  6 mo. Due for labs.

## 2020-10-26 NOTE — Assessment & Plan Note (Signed)
Doing well on her Ambien 5 mg nightly.  Follow-up in 6 months.  No significant side effects.

## 2020-10-26 NOTE — Assessment & Plan Note (Signed)
Due to recheck lipid panel.

## 2020-11-11 ENCOUNTER — Other Ambulatory Visit: Payer: Self-pay | Admitting: Family Medicine

## 2020-11-15 ENCOUNTER — Ambulatory Visit
Admission: RE | Admit: 2020-11-15 | Discharge: 2020-11-15 | Disposition: A | Discharge status: Home / Self Care | Payer: Medicare HMO | Source: Ambulatory Visit | Attending: Family Medicine | Admitting: Family Medicine

## 2020-11-15 ENCOUNTER — Other Ambulatory Visit: Payer: Self-pay

## 2020-11-15 DIAGNOSIS — Z1231 Encounter for screening mammogram for malignant neoplasm of breast: Secondary | ICD-10-CM

## 2020-11-16 ENCOUNTER — Telehealth: Payer: Self-pay | Admitting: *Deleted

## 2020-11-16 NOTE — Telephone Encounter (Signed)
Agree with above to go back down to half a tab.  We will likely need to change her medication when I see her next week.

## 2020-11-16 NOTE — Telephone Encounter (Signed)
Spoke w/pt she informed me that she has been experiencing diarrhea while taking the Sertraline. I asked her if this started when she began taking the whole tab and she stated that it did get worse. I advised her to go back down to the 1/2 tab to see if this helps with the loose stools and call back if this gets worse or there is no real change. She voiced understanding and agreed.   Will fwd to pcp. Pt has a f/u appt on 11/24/2020 to for follow up for this medication.

## 2020-11-19 ENCOUNTER — Other Ambulatory Visit: Payer: Self-pay | Admitting: Family Medicine

## 2020-11-19 DIAGNOSIS — F411 Generalized anxiety disorder: Secondary | ICD-10-CM

## 2020-11-24 ENCOUNTER — Encounter: Payer: Self-pay | Admitting: Family Medicine

## 2020-11-24 ENCOUNTER — Ambulatory Visit (INDEPENDENT_AMBULATORY_CARE_PROVIDER_SITE_OTHER): Payer: Medicare HMO | Admitting: Family Medicine

## 2020-11-24 VITALS — BP 134/66 | HR 64 | Ht 62.0 in | Wt 146.0 lb

## 2020-11-24 DIAGNOSIS — F411 Generalized anxiety disorder: Secondary | ICD-10-CM

## 2020-11-24 DIAGNOSIS — I10 Essential (primary) hypertension: Secondary | ICD-10-CM

## 2020-11-24 DIAGNOSIS — R195 Other fecal abnormalities: Secondary | ICD-10-CM

## 2020-11-24 DIAGNOSIS — F5101 Primary insomnia: Secondary | ICD-10-CM | POA: Diagnosis not present

## 2020-11-24 DIAGNOSIS — R69 Illness, unspecified: Secondary | ICD-10-CM | POA: Diagnosis not present

## 2020-11-24 MED ORDER — ZOLPIDEM TARTRATE 5 MG PO TABS: ORAL_TABLET | ORAL | 0 refills | Status: DC

## 2020-11-24 MED ORDER — ESCITALOPRAM OXALATE 10 MG PO TABS: ORAL_TABLET | ORAL | 0 refills | Status: DC

## 2020-11-24 NOTE — Assessment & Plan Note (Signed)
Discussed options.  I think Lexapro would be a good choice since she says she took it years ago and does not remember it causing any negative side effects we can at least make sure that that is not triggering the more loose stools though she does have a history of IBS.  Follow-up in 3 to 4 weeks.
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
## 2020-11-24 NOTE — Assessment & Plan Note (Signed)
OK to hold HCTZ to see if energy level improves. We can find a substitute if needed.

## 2020-11-24 NOTE — Patient Instructions (Signed)
Still when she is try holding her hydrochlorothiazide for about a week just to see if you notice an improvement in how you are feeling.  You can do this maybe about a week after you change medications just let you know what is causing what.

## 2020-11-24 NOTE — Assessment & Plan Note (Signed)
We recently switched her from the 10 mg tabs down to the 5 mg tabs she has been doing well.  She is requesting a refill today.

## 2020-12-01 DIAGNOSIS — R69 Illness, unspecified: Secondary | ICD-10-CM | POA: Diagnosis not present

## 2020-12-15 ENCOUNTER — Encounter: Payer: Self-pay | Admitting: Family Medicine

## 2020-12-15 ENCOUNTER — Ambulatory Visit (INDEPENDENT_AMBULATORY_CARE_PROVIDER_SITE_OTHER): Payer: Medicare HMO | Admitting: Family Medicine

## 2020-12-15 ENCOUNTER — Other Ambulatory Visit: Payer: Self-pay

## 2020-12-15 VITALS — BP 130/58 | HR 63 | Ht 62.0 in | Wt 148.0 lb

## 2020-12-15 DIAGNOSIS — R69 Illness, unspecified: Secondary | ICD-10-CM | POA: Diagnosis not present

## 2020-12-15 DIAGNOSIS — I10 Essential (primary) hypertension: Secondary | ICD-10-CM | POA: Diagnosis not present

## 2020-12-15 DIAGNOSIS — F411 Generalized anxiety disorder: Secondary | ICD-10-CM | POA: Diagnosis not present

## 2020-12-15 DIAGNOSIS — F5101 Primary insomnia: Secondary | ICD-10-CM | POA: Diagnosis not present

## 2020-12-15 DIAGNOSIS — L821 Other seborrheic keratosis: Secondary | ICD-10-CM

## 2020-12-15 NOTE — Patient Instructions (Signed)
The skin lesions behind your ears are seborrheic keratoses.  These can be treated with cryotherapy.  They are benign and hereditary type lesions.

## 2020-12-15 NOTE — Progress Notes (Signed)
Doing well on new medication Escitalopram 10 mg. She isn't experiencing and GI upset like she was on previous medication.   She stopped taking the HCTZ and stated that her home BP readings have been running in the 130's.    Pt had her hair trimmed yesterday and her hairdresser noticed these dark moles/bumps behind both of her ears. She said that she hadn't noticed them and denied any itching

## 2020-12-15 NOTE — Assessment & Plan Note (Signed)
He takes 5 mg of Ambien and says lately she has been waking up feeling just a little bit groggy.  We discussed cutting the Ambien in half she is wondering if with the addition of the Lexapro it might be affecting her little bit differently.  Did encourage her to cut back to 2.5 mg or even consider holding the Ambien completely.  Discussed the importance of trying to use that medication minimally.

## 2020-12-15 NOTE — Assessment & Plan Note (Signed)
She is doing well on the lexapro. No GI side effects.  She still feels a little bit tired and anxious at times like it is hard to sit still.  She is hoping that will continue to improve as she continues with the medication she is actually been on a whole tab for little over a week.  Plan to follow-up in 3 months if she is otherwise doing well.  PHQ-9 score of 1 and GAD-7 score of 1.  Rates symptoms as not difficult.
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
## 2020-12-15 NOTE — Assessment & Plan Note (Signed)
Pressure looks great today.  She reports blood pressure readings in the 120s to 130s at home which is fantastic.

## 2020-12-21 ENCOUNTER — Encounter: Payer: Self-pay | Admitting: Family Medicine

## 2020-12-21 DIAGNOSIS — F411 Generalized anxiety disorder: Secondary | ICD-10-CM

## 2020-12-21 MED ORDER — ESCITALOPRAM OXALATE 10 MG PO TABS: 10.0000 mg | ORAL_TABLET | Freq: Every day | ORAL | 0 refills | Status: DC

## 2020-12-27 DIAGNOSIS — H2513 Age-related nuclear cataract, bilateral: Secondary | ICD-10-CM | POA: Diagnosis not present

## 2020-12-27 DIAGNOSIS — H5213 Myopia, bilateral: Secondary | ICD-10-CM | POA: Diagnosis not present

## 2020-12-27 DIAGNOSIS — H524 Presbyopia: Secondary | ICD-10-CM | POA: Diagnosis not present

## 2020-12-27 DIAGNOSIS — Z0100 Encounter for examination of eyes and vision without abnormal findings: Secondary | ICD-10-CM | POA: Diagnosis not present

## 2021-03-16 ENCOUNTER — Ambulatory Visit: Payer: Medicare HMO | Admitting: Family Medicine

## 2021-03-19 ENCOUNTER — Other Ambulatory Visit: Payer: Self-pay | Admitting: Family Medicine

## 2021-03-19 DIAGNOSIS — F411 Generalized anxiety disorder: Secondary | ICD-10-CM

## 2021-03-20 ENCOUNTER — Encounter: Payer: Self-pay | Admitting: Family Medicine

## 2021-03-20 ENCOUNTER — Ambulatory Visit (INDEPENDENT_AMBULATORY_CARE_PROVIDER_SITE_OTHER): Payer: Medicare HMO | Admitting: Family Medicine

## 2021-03-20 VITALS — BP 134/82 | HR 63 | Ht 62.0 in | Wt 147.0 lb

## 2021-03-20 DIAGNOSIS — F411 Generalized anxiety disorder: Secondary | ICD-10-CM | POA: Diagnosis not present

## 2021-03-20 DIAGNOSIS — F5101 Primary insomnia: Secondary | ICD-10-CM

## 2021-03-20 DIAGNOSIS — I10 Essential (primary) hypertension: Secondary | ICD-10-CM

## 2021-03-20 DIAGNOSIS — R69 Illness, unspecified: Secondary | ICD-10-CM | POA: Diagnosis not present
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
## 2021-03-20 NOTE — Addendum Note (Signed)
Addended by: Beatrice Lecher D on: 03/20/2021 09:07 AM   Modules accepted: Orders

## 2021-03-20 NOTE — Assessment & Plan Note (Signed)
Doing well on current regimen.  Follow-up in 6 months.  Symptom score is at goal.

## 2021-03-20 NOTE — Progress Notes (Signed)
Doing well on current regimen .  

## 2021-03-20 NOTE — Assessment & Plan Note (Signed)
Blood pressure significantly elevated today though she reports that normally they are well controlled at home and when she was here in December her blood pressure looks good.

## 2021-03-20 NOTE — Assessment & Plan Note (Signed)
Tolerating the lower dose of Ambien 5 mg well.  Continue current regimen.  Follow-up in 6 months.

## 2021-03-20 NOTE — Patient Instructions (Signed)
Please get your tetanus vaccine updated at your local pharmacy.  Also please schedule your Medicare wellness exam at your convenience with our Medicare wellness nurse.Marland Kitchen

## 2021-03-21 LAB — BASIC METABOLIC PANEL WITH GFR
BUN: 12 mg/dL (ref 7–25)
CO2: 29 mmol/L (ref 20–32)
Calcium: 9.3 mg/dL (ref 8.6–10.4)
Chloride: 105 mmol/L (ref 98–110)
Creat: 0.77 mg/dL (ref 0.60–0.93)
GFR, Est African American: 90 mL/min/{1.73_m2} (ref >=60)
GFR, Est Non African American: 78 mL/min/{1.73_m2} (ref >=60)
Glucose, Bld: 88 mg/dL (ref 65–99)
Potassium: 4.4 mmol/L (ref 3.5–5.3)
Sodium: 141 mmol/L (ref 135–146)

## 2021-03-21 NOTE — Progress Notes (Signed)
All labs are normal. 

## 2021-03-22 ENCOUNTER — Other Ambulatory Visit: Payer: Self-pay | Admitting: Family Medicine

## 2021-03-30 ENCOUNTER — Encounter: Payer: Self-pay | Admitting: Family Medicine

## 2021-03-30 ENCOUNTER — Telehealth (INDEPENDENT_AMBULATORY_CARE_PROVIDER_SITE_OTHER): Payer: Medicare HMO | Admitting: Family Medicine

## 2021-03-30 DIAGNOSIS — J309 Allergic rhinitis, unspecified: Secondary | ICD-10-CM

## 2021-03-30 NOTE — Progress Notes (Signed)
Virtual Visit via Telephone Note  I connected with  Laurian Brim on 03/30/21 at  1:00 PM EDT by telephone and verified that I am speaking with the correct person using two identifiers.   I discussed the limitations, risks, security and privacy concerns of performing an evaluation and management service by telephone and the availability of in person appointments. I also discussed with the patient that there may be a patient responsible charge related to this service. The patient expressed understanding and agreed to proceed.  Participating parties included in this telephone visit include: The patient and the nurse practitioner listed.  The patient is: At home I am: In the office  Subjective:    CC: sneezing, runny nose, itchy/watery eyes  HPI: Deborah Mccullough is a 70 y.o. year old female presenting today via telephone visit to discuss sneezing, runny nose, itchy/watery eyes.  Patient reports that starting around Saturday she noticed she was sneezing a lot, had itchy/watery eyes, nasal congestion, postnasal drip, runny nose, tickle in her throat causing a light cough.  She has not had any fevers, fatigue, sinus pressure or pain, headaches, ear pain, chest pain, shortness of breath, nausea/vomiting/diarrhea.  So far the only medication she has tried over-the-counter is Coricidin.  She says she does not have a history of seasonal allergies and has no known sick contacts.    Past medical history, Surgical history, Family history not pertinant except as noted below, Social history, Allergies, and medications have been entered into the medical record, reviewed, and corrections made.   Review of Systems:  All review of systems negative except what is listed in the HPI  Objective:    General:  Patient speaking clearly in complete sentences. No shortness of breath noted.   Alert and oriented x3.   Normal judgment.  No apparent acute distress.  Impression and Recommendations:    1. Allergic  rhinitis, unspecified seasonality, unspecified trigger Symptoms and presentation consistent with allergic rhinitis.  Educated patient on allergic rhinitis versus viral and bacterial infections, as well as antibiotic stewardship.  Would like to start conservatively by treating for seasonal allergies with a daily antihistamine such as Claritin or Zyrtec, Flonase, and she can also add some Mucinex for the congestion if not improving - she would like to get all of this OTC.  Continue to use Coricidin if needed, over-the-counter analgesics if needed, rest, hydration, avoiding allergens, humidifier use, saline rinse.  If symptoms worsen or persist can reevaluate her for URI, sinusitis, etc.  Educated on signs and symptoms that would require urgent evaluation.  Follow-up if symptoms worsen or fail to improve.    I discussed the assessment and treatment plan with the patient. The patient was provided an opportunity to ask questions and all were answered. The patient agreed with the plan and demonstrated an understanding of the instructions.   The patient was advised to call back or seek an in-person evaluation if the symptoms worsen or if the condition fails to improve as anticipated.  I provided 20 minutes of non-face-to-face time during this TELEPHONE encounter.    Terrilyn Saver, NP

## 2021-03-30 NOTE — Patient Instructions (Signed)
I'm sorry you aren't feeling well! Let's start by treating this like allergies. If you worsen or do not get any relief over the next several days, then we can reevaluated you and change the treatment plan if needed. I hope you feel better soon!  Allergic Rhinitis, Adult Allergic rhinitis is a reaction to allergens. Allergens are things that can cause an allergic reaction. This condition affects the lining inside the nose (mucous membrane). There are two types of allergic rhinitis:  Seasonal. This type is also called hay fever. It happens only during some times of the year.  Perennial. This type can happen at any time of the year. This condition cannot be spread from person to person (is not contagious). It can be mild, worse, or very bad. It can develop at any age and may be outgrown. What are the causes? This condition may be caused by:  Pollen from grasses, trees, and weeds.  Dust mites.  Smoke.  Mold.  Car fumes.  The pee (urine), spit, or dander of pets. Dander is dead skin cells from a pet.   What increases the risk? You are more likely to develop this condition if:  You have allergies in your family.  You have problems like allergies in your family. You may have: ? Swelling of parts of your eyes and eyelids. ? Asthma. This affects how you breathe. ? Long-term redness and swelling on your skin. ? Food allergies. What are the signs or symptoms? The main symptom of this condition is a runny or stuffy nose (nasal congestion). Other symptoms may include:  Sneezing or coughing.  Itching and tearing of your eyes.  Mucus that drips down the back of your throat (postnasal drip).  Trouble sleeping.  Feeling tired.  Headache.  Sore throat. How is this treated? There is no cure for this condition. You should avoid things that you are allergic to. Treatment can help to relieve symptoms. This may include:  Medicines that block allergy symptoms, such as corticosteroids or  antihistamines. These may be given as a shot, nasal spray, or pill.  Avoiding things you are allergic to.  Medicines that give you bits of what you are allergic to over time. This is called immunotherapy. It is done if other treatments do not help. You may get: ? Shots. ? Medicine under your tongue.  Stronger medicines, if other treatments do not help. Follow these instructions at home: Avoiding allergens Find out what things you are allergic to and avoid them. To do this, try these things:  If you get allergies any time of year: ? Replace carpet with wood, tile, or vinyl flooring. Carpet can trap pet dander and dust. ? Do not smoke. Do not allow smoking in your home. ? Change your heating and air conditioning filters at least once a month.  If you get allergies only some times of the year: ? Keep windows closed when you can. ? Plan things to do outside when pollen counts are lowest. Check pollen counts before you plan things to do outside. ? When you come indoors, change your clothes and shower before you sit on furniture or bedding.   If you are allergic to a pet: ? Keep the pet out of your bedroom. ? Vacuum, sweep, and dust often.   General instructions  Take over-the-counter and prescription medicines only as told by your doctor.  Drink enough fluid to keep your pee (urine) pale yellow.  Keep all follow-up visits as told by your doctor. This  is important. Where to find more information  American Academy of Allergy, Asthma & Immunology: www.aaaai.org Contact a doctor if:  You have a fever.  You get a cough that does not go away.  You make whistling sounds when you breathe (wheeze).  Your symptoms slow you down.  Your symptoms stop you from doing your normal things each day. Get help right away if:  You are short of breath. This symptom may be an emergency. Do not wait to see if the symptom will go away. Get medical help right away. Call your local emergency  services (911 in the [place] Do not drive yourself to the hospital. Summary  Allergic rhinitis may be treated by taking medicines and avoiding things you are allergic to.  If you have allergies only some of the year, keep windows closed when you can at those times.  Contact your doctor if you get a fever or a cough that does not go away. This information is not intended to replace advice given to you by your health care provider. Make sure you discuss any questions you have with your health care provider. Document Revised: 01/25/2020 Document Reviewed: 12/01/2019 Elsevier Patient Education  2021 [employer].

## 2021-05-05 ENCOUNTER — Other Ambulatory Visit: Payer: Self-pay | Admitting: Family Medicine

## 2021-05-05 DIAGNOSIS — F5101 Primary insomnia: Secondary | ICD-10-CM

## 2021-05-11 ENCOUNTER — Telehealth: Payer: Self-pay | Admitting: Neurology

## 2021-05-11 NOTE — Telephone Encounter (Signed)
Prior Authorization for Zolpidem Tartrate 5MG  tablets submitted via covermymeds. Awaiting response. Your information has been submitted to [employer]. [employer] will review the request and will issue a decision, typically within 1-3 days from your submission. You can check the updated outcome later by reopening this request.  If [employer] has not responded in 1-3 days or if you have any questions about your ePA request, please contact [employer] at [phone]. If you think there may be a problem with your PA request, use our live chat feature at the bottom right.

## 2021-05-15 ENCOUNTER — Other Ambulatory Visit: Payer: Self-pay | Admitting: Family Medicine

## 2021-05-15 DIAGNOSIS — F5101 Primary insomnia: Secondary | ICD-10-CM

## 2021-06-10 ENCOUNTER — Other Ambulatory Visit: Payer: Self-pay | Admitting: Family Medicine

## 2021-06-10 DIAGNOSIS — F411 Generalized anxiety disorder: Secondary | ICD-10-CM

## 2021-06-22 ENCOUNTER — Other Ambulatory Visit: Payer: Self-pay | Admitting: Family Medicine

## 2021-07-07 ENCOUNTER — Other Ambulatory Visit: Payer: Self-pay | Admitting: Family Medicine

## 2021-07-20 NOTE — Telephone Encounter (Signed)
Medication: zolpidem 5 mg Prior authorization determination received Medication has been approved

## 2021-08-08 ENCOUNTER — Encounter: Payer: Self-pay | Admitting: Family Medicine

## 2021-08-08 NOTE — Telephone Encounter (Signed)
[place] for jury duty note.

## 2021-08-09 ENCOUNTER — Encounter: Payer: Self-pay | Admitting: Family Medicine

## 2021-08-28 ENCOUNTER — Telehealth: Payer: Self-pay | Admitting: Family Medicine

## 2021-08-28 NOTE — Telephone Encounter (Signed)
Left message for patient to call back and schedule Medicare Annual Wellness Visit (AWV) either virtually or in office.   Last AWV 04/15/18  please schedule at anytime with health coach  .

## 2021-08-28 NOTE — Telephone Encounter (Signed)
Spouse called stating they would call the office and schedule appt

## 2021-09-13 ENCOUNTER — Other Ambulatory Visit: Payer: Self-pay | Admitting: Family Medicine

## 2021-09-13 DIAGNOSIS — F411 Generalized anxiety disorder: Secondary | ICD-10-CM

## 2021-09-19 ENCOUNTER — Ambulatory Visit (INDEPENDENT_AMBULATORY_CARE_PROVIDER_SITE_OTHER): Payer: Medicare HMO | Admitting: Family Medicine

## 2021-09-19 ENCOUNTER — Other Ambulatory Visit: Payer: Self-pay

## 2021-09-19 ENCOUNTER — Encounter: Payer: Self-pay | Admitting: Family Medicine

## 2021-09-19 VITALS — BP 138/78 | HR 60 | Ht 62.0 in | Wt 149.0 lb

## 2021-09-19 DIAGNOSIS — F411 Generalized anxiety disorder: Secondary | ICD-10-CM

## 2021-09-19 DIAGNOSIS — I10 Essential (primary) hypertension: Secondary | ICD-10-CM

## 2021-09-19 DIAGNOSIS — R69 Illness, unspecified: Secondary | ICD-10-CM | POA: Diagnosis not present

## 2021-09-19 DIAGNOSIS — Z780 Asymptomatic menopausal state: Secondary | ICD-10-CM

## 2021-09-19 DIAGNOSIS — F5101 Primary insomnia: Secondary | ICD-10-CM

## 2021-09-19 DIAGNOSIS — Z23 Encounter for immunization: Secondary | ICD-10-CM | POA: Diagnosis not present

## 2021-09-19 NOTE — Assessment & Plan Note (Signed)
Stable. Well controlled.  F/U in 6 mo
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
## 2021-09-19 NOTE — Assessment & Plan Note (Signed)
Well controlled. Continue current regimen. Follow up in  6 mo due for full labs.

## 2021-09-19 NOTE — Assessment & Plan Note (Signed)
Due for bone density test.  We will get this ordered and see if they can schedule at the same home as her mammogram which is due next month.

## 2021-09-19 NOTE — Patient Instructions (Signed)
Please get your Tdap done at the pharmacy.

## 2021-09-19 NOTE — Assessment & Plan Note (Signed)
Happy on current regimen monitor for side effects with the Ambien.

## 2021-09-20 LAB — LIPID PANEL W/REFLEX DIRECT LDL
Cholesterol: 167 mg/dL (ref <200)
HDL: 54 mg/dL (ref >=50)
LDL Cholesterol (Calc): 94 mg/dL (calc)
Non-HDL Cholesterol (Calc): 113 mg/dL (calc) (ref <130)
Total CHOL/HDL Ratio: 3.1 (calc) (ref <5.0)
Triglycerides: 92 mg/dL (ref <150)

## 2021-09-20 LAB — CBC
HCT: 39.6 % (ref 35.0–45.0)
Hemoglobin: 13.5 g/dL (ref 11.7–15.5)
MCH: 30.4 pg (ref 27.0–33.0)
MCHC: 34.1 g/dL (ref 32.0–36.0)
MCV: 89.2 fL (ref 80.0–100.0)
MPV: 9.6 fL (ref 7.5–12.5)
Platelets: 247 10*3/uL (ref 140–400)
RBC: 4.44 10*6/uL (ref 3.80–5.10)
RDW: 12.9 % (ref 11.0–15.0)
WBC: 5.9 10*3/uL (ref 3.8–10.8)

## 2021-09-20 LAB — COMPLETE METABOLIC PANEL WITH GFR
AG Ratio: 1.6 (calc) (ref 1.0–2.5)
ALT: 14 U/L (ref 6–29)
AST: 20 U/L (ref 10–35)
Albumin: 4.3 g/dL (ref 3.6–5.1)
Alkaline phosphatase (APISO): 68 U/L (ref 37–153)
BUN: 14 mg/dL (ref 7–25)
CO2: 28 mmol/L (ref 20–32)
Calcium: 9.6 mg/dL (ref 8.6–10.4)
Chloride: 104 mmol/L (ref 98–110)
Creat: 0.83 mg/dL (ref 0.60–1.00)
Globulin: 2.7 g/dL (calc) (ref 1.9–3.7)
Glucose, Bld: 93 mg/dL (ref 65–99)
Potassium: 4.4 mmol/L (ref 3.5–5.3)
Sodium: 140 mmol/L (ref 135–146)
Total Bilirubin: 0.5 mg/dL (ref 0.2–1.2)
Total Protein: 7 g/dL (ref 6.1–8.1)
eGFR: 75 mL/min/{1.73_m2} (ref >=60)

## 2021-09-20 NOTE — Progress Notes (Signed)
Hi Deborah Mccullough, cholesterol looks great this year.  Metabolic panel and blood count are also normal.  Just encourage you to remember to get your tetanus and shingles vaccine done at the pharmacy.

## 2021-10-06 ENCOUNTER — Other Ambulatory Visit: Payer: Self-pay | Admitting: Family Medicine

## 2021-10-06 DIAGNOSIS — Z1231 Encounter for screening mammogram for malignant neoplasm of breast: Secondary | ICD-10-CM

## 2021-10-11 ENCOUNTER — Other Ambulatory Visit: Payer: Self-pay

## 2021-10-11 ENCOUNTER — Ambulatory Visit (INDEPENDENT_AMBULATORY_CARE_PROVIDER_SITE_OTHER): Payer: Medicare HMO | Admitting: Family Medicine

## 2021-10-11 VITALS — BP 164/73 | HR 57 | Ht 62.0 in | Wt 149.1 lb

## 2021-10-11 DIAGNOSIS — Z0000 Encounter for general adult medical examination without abnormal findings: Secondary | ICD-10-CM | POA: Diagnosis not present
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
## 2021-10-11 NOTE — Patient Instructions (Addendum)
[facility] Maintenance Summary and Written Plan of Care  [name] ,  Thank you for allowing me to perform your Medicare Annual Wellness Visit and for your ongoing commitment to your health.   Health Maintenance & Immunization History Health Maintenance  Topic Date Due   COVID-19 Vaccine (5 - Booster for [place] series) 10/27/2021 (Originally 08/21/2021)   Zoster Vaccines- Shingrix (1 of 2) 01/11/2022 (Originally 11/08/1970)   TETANUS/TDAP  10/11/2022 (Originally 12/16/2019)   MAMMOGRAM  11/15/2022   COLONOSCOPY (Pts 45-51yrs Insurance coverage will need to be confirmed)  01/11/2025   Pneumonia Vaccine 39+ Years old  Completed   INFLUENZA VACCINE  Completed   DEXA SCAN  Completed   Hepatitis C Screening  Completed   HPV VACCINES  Aged Out   Immunization History  Administered Date(s) Administered   Fluad Quad(high Dose 65+) 09/14/2020, 09/19/2021   Influenza Split 09/20/2011, 09/24/2012   Influenza Whole 10/12/2008, 10/30/2010   Influenza, High Dose Seasonal PF 10/31/2016, 09/30/2017, 10/10/2018, 09/22/2019   Influenza,inj,Quad PF,6+ Mos 09/02/2013, 10/19/2014, 10/31/2015   Influenza,inj,quad, With Preservative 09/16/2018   Influenza-Unspecified 09/16/2018   PFIZER(Purple Top)SARS-COV-2 Vaccination 01/06/2020, 01/27/2020, 09/27/2020, 06/26/2021   Pneumococcal Conjugate-13 10/31/2015   Pneumococcal Polysaccharide-23 12/24/2016   Td 12/15/2009   Zoster, Live 09/20/2011    These are the patient goals that we discussed:  Goals Addressed               This Visit's Progress     Patient Stated (pt-stated)        10/11/2021 AWV Goal: Exercise for General Health  Patient will verbalize understanding of the benefits of increased physical activity: Exercising regularly is important. It will improve your overall fitness, flexibility, and endurance. Regular exercise also will improve your overall health. It can help you control your weight, reduce  stress, and improve your bone density. Over the next year, patient will increase physical activity as tolerated with a goal of at least 150 minutes of moderate physical activity per week.  You can tell that you are exercising at a moderate intensity if your heart starts beating faster and you start breathing faster but can still hold a conversation. Moderate-intensity exercise ideas include: Walking 1 mile (1.6 km) in about 15 minutes Biking Hiking Golfing Dancing Water aerobics Patient will verbalize understanding of everyday activities that increase physical activity by providing examples like the following: Yard work, such as: Sales promotion account executive Gardening Washing windows or floors Patient will be able to explain general safety guidelines for exercising:  Before you start a new exercise program, talk with your health care provider. Do not exercise so much that you hurt yourself, feel dizzy, or get very short of breath. Wear comfortable clothes and wear shoes with good support. Drink plenty of water while you exercise to prevent dehydration or heat stroke. Work out until your breathing and your heartbeat get faster.          This is a list of Health Maintenance Items that are overdue or due now: Td vaccine Shingles vaccine Mammogram - scheduled for December  Dexa scan- schedule for March, 2023.   Orders/Referrals Placed Today: No orders of the defined types were placed in this encounter.  (Contact our referral department at [phone] if you have not spoken with someone about your referral appointment within the next 5 days)    Follow-up Plan Follow-up with Hali Marry, MD as planned  Schedule your tetanus and shingles vaccine at your pharmacy. Schedule nurse visit for BP check in 2 weeks. Bring your home BP log with you and the blood pressure cuff at that visit. Medicare wellness  visit in one year. AVS printed and given to the patient.      Health Maintenance, Female Adopting a healthy lifestyle and getting preventive care are important in promoting health and wellness. Ask your health care provider about: The right schedule for you to have regular tests and exams. Things you can do on your own to prevent diseases and keep yourself healthy. What should I know about diet, weight, and exercise? Eat a healthy diet  Eat a diet that includes plenty of vegetables, fruits, low-fat dairy products, and lean protein. Do not eat a lot of foods that are high in solid fats, added sugars, or sodium. Maintain a healthy weight Body mass index (BMI) is used to identify weight problems. It estimates body fat based on height and weight. Your health care provider can help determine your BMI and help you achieve or maintain a healthy weight. Get regular exercise Get regular exercise. This is one of the most important things you can do for your health. Most adults should: Exercise for at least 150 minutes each week. The exercise should increase your heart rate and make you sweat (moderate-intensity exercise). Do strengthening exercises at least twice a week. This is in addition to the moderate-intensity exercise. Spend less time sitting. Even light physical activity can be beneficial. Watch cholesterol and blood lipids Have your blood tested for lipids and cholesterol at 72 years of age, then have this test every 5 years. Have your cholesterol levels checked more often if: Your lipid or cholesterol levels are high. You are older than 72 years of age. You are at high risk for heart disease. What should I know about cancer screening? Depending on your health history and family history, you may need to have cancer screening at various ages. This may include screening for: Breast cancer. Cervical cancer. Colorectal cancer. Skin cancer. Lung cancer. What should I know about heart  disease, diabetes, and high blood pressure? Blood pressure and heart disease High blood pressure causes heart disease and increases the risk of stroke. This is more likely to develop in people who have high blood pressure readings, are of African descent, or are overweight. Have your blood pressure checked: Every 3-5 years if you are 39-90 years of age. Every year if you are 22 years old or older. Diabetes Have regular diabetes screenings. This checks your fasting blood sugar level. Have the screening done: Once every three years after age 45 if you are at a normal weight and have a low risk for diabetes. More often and at a younger age if you are overweight or have a high risk for diabetes. What should I know about preventing infection? Hepatitis B If you have a higher risk for hepatitis B, you should be screened for this virus. Talk with your health care provider to find out if you are at risk for hepatitis B infection. Hepatitis C Testing is recommended for: Everyone born from [birth year] through 1967. Anyone with known risk factors for hepatitis C. Sexually transmitted infections (STIs) Get screened for STIs, including gonorrhea and chlamydia, if: You are sexually active and are younger than 72 years of age. You are older than 72 years of age and your health care provider tells you that you are at risk for this type of infection. Your  sexual activity has changed since you were last screened, and you are at increased risk for chlamydia or gonorrhea. Ask your health care provider if you are at risk. Ask your health care provider about whether you are at high risk for HIV. Your health care provider may recommend a prescription medicine to help prevent HIV infection. If you choose to take medicine to prevent HIV, you should first get tested for HIV. You should then be tested every 3 months for as long as you are taking the medicine. Pregnancy If you are about to stop having your period  (premenopausal) and you may become pregnant, seek counseling before you get pregnant. Take 400 to 800 micrograms (mcg) of folic acid every day if you become pregnant. Ask for birth control (contraception) if you want to prevent pregnancy. Osteoporosis and menopause Osteoporosis is a disease in which the bones lose minerals and strength with aging. This can result in bone fractures. If you are 39 years old or older, or if you are at risk for osteoporosis and fractures, ask your health care provider if you should: Be screened for bone loss. Take a calcium or vitamin D supplement to lower your risk of fractures. Be given hormone replacement therapy (HRT) to treat symptoms of menopause. Follow these instructions at home: Lifestyle Do not use any products that contain nicotine or tobacco, such as cigarettes, e-cigarettes, and chewing tobacco. If you need help quitting, ask your health care provider. Do not use street drugs. Do not share needles. Ask your health care provider for help if you need support or information about quitting drugs. Alcohol use Do not drink alcohol if: Your health care provider tells you not to drink. You are pregnant, may be pregnant, or are planning to become pregnant. If you drink alcohol: Limit how much you use to 0-1 drink a day. Limit intake if you are breastfeeding. Be aware of how much alcohol is in your drink. In the [place] one drink equals one 12 oz bottle of beer (355 mL), one 5 oz glass of wine (148 mL), or one 1 oz glass of hard liquor (44 mL). General instructions Schedule regular health, dental, and eye exams. Stay current with your vaccines. Tell your health care provider if: You often feel depressed. You have ever been abused or do not feel safe at home. Summary Adopting a healthy lifestyle and getting preventive care are important in promoting health and wellness. Follow your health care provider's instructions about healthy diet, exercising, and  getting tested or screened for diseases. Follow your health care provider's instructions on monitoring your cholesterol and blood pressure. This information is not intended to replace advice given to you by your health care provider. Make sure you discuss any questions you have with your health care provider. Document Revised: 02/10/2021 Document Reviewed: 11/26/2018 Elsevier Patient Education  2022 [employer].

## 2021-10-17 HISTORY — PX: OTHER SURGICAL HISTORY: SHX169

## 2021-10-25 ENCOUNTER — Other Ambulatory Visit: Payer: Self-pay

## 2021-10-25 ENCOUNTER — Ambulatory Visit (INDEPENDENT_AMBULATORY_CARE_PROVIDER_SITE_OTHER): Payer: Medicare HMO | Admitting: Family Medicine

## 2021-10-25 VITALS — BP 138/63 | HR 58 | Temp 98.0°F | Wt 149.1 lb

## 2021-10-25 DIAGNOSIS — I10 Essential (primary) hypertension: Secondary | ICD-10-CM | POA: Diagnosis not present

## 2021-10-25 NOTE — Progress Notes (Signed)
Agree with documentation as above.   Roniya Tetro, MD  

## 2021-10-25 NOTE — Progress Notes (Signed)
Patient is here for a blood pressure check. Denies chest pains, palpitations, SOB, lightheadedness, dizziness, no missed dose, mood or medication changes.    Patient's blood pressure was not at goal at initial reading. Patient sat for 10 minutes. Blood pressure was in goal after the second check. Patient advised to schedule next appointment with provider.  Blood pressure readings:  #1 146/66 (60) #2  138/63 (58)

## 2021-11-13 ENCOUNTER — Other Ambulatory Visit: Payer: Self-pay

## 2021-11-13 ENCOUNTER — Ambulatory Visit (INDEPENDENT_AMBULATORY_CARE_PROVIDER_SITE_OTHER): Payer: Medicare HMO | Admitting: Family Medicine

## 2021-11-13 ENCOUNTER — Ambulatory Visit (INDEPENDENT_AMBULATORY_CARE_PROVIDER_SITE_OTHER): Payer: Medicare HMO

## 2021-11-13 ENCOUNTER — Encounter: Payer: Self-pay | Admitting: Family Medicine

## 2021-11-13 VITALS — BP 162/70 | HR 63 | Resp 17

## 2021-11-13 DIAGNOSIS — M25512 Pain in left shoulder: Secondary | ICD-10-CM

## 2021-11-13 DIAGNOSIS — M542 Cervicalgia: Secondary | ICD-10-CM

## 2021-11-13 MED ORDER — CYCLOBENZAPRINE HCL 5 MG PO TABS: 5.0000 mg | ORAL_TABLET | Freq: Every evening | ORAL | 0 refills | Status: DC | PRN

## 2021-11-13 NOTE — Patient Instructions (Signed)
Neck and shoulder xray today Home exercises - handout provided. Consider physical therapy  Heat, massage, ice for comfort Continue Ibuprofen regimen for dental pain. Once feeling better, if you want to try a burst of steroids please let me know - these cannot be taken at the same time.
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
## 2021-11-16 ENCOUNTER — Ambulatory Visit
Admission: RE | Admit: 2021-11-16 | Discharge: 2021-11-16 | Disposition: A | Discharge status: Home / Self Care | Payer: Medicare HMO | Source: Ambulatory Visit | Attending: Family Medicine | Admitting: Family Medicine

## 2021-11-16 DIAGNOSIS — Z1231 Encounter for screening mammogram for malignant neoplasm of breast: Secondary | ICD-10-CM

## 2021-11-16 NOTE — Progress Notes (Signed)
Please call patient. Normal mammogram.  Repeat in 1 year.  

## 2021-11-17 ENCOUNTER — Encounter: Payer: Self-pay | Admitting: Family Medicine

## 2021-11-17 DIAGNOSIS — M25512 Pain in left shoulder: Secondary | ICD-10-CM

## 2021-11-17 MED ORDER — MELOXICAM 15 MG PO TABS: 15.0000 mg | ORAL_TABLET | Freq: Every day | ORAL | 0 refills | Status: DC

## 2021-11-17 MED ORDER — PREDNISONE 20 MG PO TABS: 40.0000 mg | ORAL_TABLET | Freq: Every day | ORAL | 0 refills | Status: AC

## 2021-11-26 ENCOUNTER — Other Ambulatory Visit: Payer: Self-pay | Admitting: Family Medicine

## 2021-11-26 DIAGNOSIS — M25512 Pain in left shoulder: Secondary | ICD-10-CM

## 2021-12-12 ENCOUNTER — Other Ambulatory Visit: Payer: Self-pay | Admitting: Family Medicine

## 2021-12-12 DIAGNOSIS — F411 Generalized anxiety disorder: Secondary | ICD-10-CM

## 2021-12-21 ENCOUNTER — Other Ambulatory Visit: Payer: Self-pay | Admitting: Family Medicine

## 2022-01-18 DIAGNOSIS — H524 Presbyopia: Secondary | ICD-10-CM | POA: Diagnosis not present

## 2022-01-18 DIAGNOSIS — H2513 Age-related nuclear cataract, bilateral: Secondary | ICD-10-CM | POA: Diagnosis not present

## 2022-01-18 DIAGNOSIS — H5213 Myopia, bilateral: Secondary | ICD-10-CM | POA: Diagnosis not present

## 2022-02-14 DIAGNOSIS — H2513 Age-related nuclear cataract, bilateral: Secondary | ICD-10-CM | POA: Diagnosis not present

## 2022-02-14 DIAGNOSIS — H40013 Open angle with borderline findings, low risk, bilateral: Secondary | ICD-10-CM | POA: Diagnosis not present

## 2022-02-14 DIAGNOSIS — H5213 Myopia, bilateral: Secondary | ICD-10-CM | POA: Diagnosis not present

## 2022-02-14 DIAGNOSIS — H524 Presbyopia: Secondary | ICD-10-CM | POA: Diagnosis not present

## 2022-03-03 ENCOUNTER — Other Ambulatory Visit: Payer: Self-pay | Admitting: Family Medicine

## 2022-03-03 DIAGNOSIS — F5101 Primary insomnia: Secondary | ICD-10-CM

## 2022-03-08 ENCOUNTER — Ambulatory Visit
Admission: RE | Admit: 2022-03-08 | Discharge: 2022-03-08 | Disposition: A | Discharge status: Home / Self Care | Payer: Medicare HMO | Source: Ambulatory Visit | Attending: Family Medicine | Admitting: Family Medicine

## 2022-03-08 DIAGNOSIS — M810 Age-related osteoporosis without current pathological fracture: Secondary | ICD-10-CM | POA: Diagnosis not present

## 2022-03-08 DIAGNOSIS — Z780 Asymptomatic menopausal state: Secondary | ICD-10-CM | POA: Diagnosis not present

## 2022-03-08 DIAGNOSIS — M8588 Other specified disorders of bone density and structure, other site: Secondary | ICD-10-CM | POA: Diagnosis not present

## 2022-03-09 ENCOUNTER — Other Ambulatory Visit: Payer: Self-pay

## 2022-03-09 ENCOUNTER — Encounter: Payer: Self-pay | Admitting: Family Medicine

## 2022-03-09 ENCOUNTER — Other Ambulatory Visit: Payer: Self-pay | Admitting: Family Medicine

## 2022-03-09 DIAGNOSIS — M810 Age-related osteoporosis without current pathological fracture: Secondary | ICD-10-CM

## 2022-03-09 MED ORDER — ALENDRONATE SODIUM 70 MG PO TABS: 70.0000 mg | ORAL_TABLET | ORAL | 3 refills | Status: DC

## 2022-03-09 NOTE — Progress Notes (Signed)
Fosamax sent to pharmacy 

## 2022-03-09 NOTE — Progress Notes (Signed)
Your bone density test shows that you do have thin bones consistent with osteoporosis.  T score of -2.6. ? ?The current recommendation for osteoporosis treatment includes:  ? ?#1 calcium-total of 1200 mg of calcium daily.  If you eat a very calcium rich diet you may be able to obtain that without a supplement.  If not, then I recommend calcium 500 mg twice a day.  There are several products over-the-counter such as Caltrate D and Viactiv chews which are great options that contain calcium and vitamin D. ?#2 vitamin D-recommend 800 international units daily. ?#3 exercise-recommend 30 minutes of weightbearing exercise 3 days a week.  Resistance training ,such as doing bands and light weights, can be particularly helpful. ?#4 medication-if you are not currently on a bone builder, also called a bisphosphonate, then this has been shown to be very helpful in maintaining bone strength, preventing further thinning of the bones, and reducing your risk for fractures.  I would highly recommend that you consider starting 1 of these medications.  If you are okay with that then please let us know and we will send one to your pharmacy.  If you would like to discuss further we are happy to make an appointment for you so that we can go over options for treatment. ?

## 2022-03-20 ENCOUNTER — Ambulatory Visit: Payer: Medicare HMO | Admitting: Family Medicine

## 2022-03-28 DIAGNOSIS — Z0100 Encounter for examination of eyes and vision without abnormal findings: Secondary | ICD-10-CM | POA: Diagnosis not present

## 2022-04-03 ENCOUNTER — Ambulatory Visit (INDEPENDENT_AMBULATORY_CARE_PROVIDER_SITE_OTHER): Payer: Medicare HMO | Admitting: Family Medicine

## 2022-04-03 ENCOUNTER — Encounter: Payer: Self-pay | Admitting: Family Medicine

## 2022-04-03 ENCOUNTER — Telehealth: Payer: Self-pay | Admitting: Family Medicine

## 2022-04-03 VITALS — BP 150/68 | HR 58 | Ht 62.0 in | Wt 148.0 lb

## 2022-04-03 DIAGNOSIS — F339 Major depressive disorder, recurrent, unspecified: Secondary | ICD-10-CM

## 2022-04-03 DIAGNOSIS — R69 Illness, unspecified: Secondary | ICD-10-CM | POA: Diagnosis not present

## 2022-04-03 DIAGNOSIS — I10 Essential (primary) hypertension: Secondary | ICD-10-CM

## 2022-04-03 DIAGNOSIS — Z23 Encounter for immunization: Secondary | ICD-10-CM | POA: Diagnosis not present

## 2022-04-03 DIAGNOSIS — M810 Age-related osteoporosis without current pathological fracture: Secondary | ICD-10-CM

## 2022-04-03 DIAGNOSIS — H6123 Impacted cerumen, bilateral: Secondary | ICD-10-CM

## 2022-04-03 MED ORDER — LOSARTAN POTASSIUM 25 MG PO TABS: 25.0000 mg | ORAL_TABLET | Freq: Every day | ORAL | 0 refills | Status: DC

## 2022-04-03 NOTE — Assessment & Plan Note (Addendum)
Not well controlled today and it was a little elevated back in November.  Repeat blood pressure still high.  We will add 25 mg of losartan have her follow back up in 2 to 3 weeks for recheck blood pressure. ?

## 2022-04-03 NOTE — Assessment & Plan Note (Signed)
Discussed that it is certainly reasonable to hold the Fosamax until she has her dental work done.  Did encourage her to speak with her dentist about it can increase risk of osteoporosis of the jaw.  Some perfectly fine with her holding the medication for a few months if she needs to.  She will continue with her calcium and vitamin D which she has been taking regularly. ?

## 2022-04-03 NOTE — Patient Instructions (Signed)
Follow up in 2 weeks for nurse visit for bp check ?

## 2022-04-03 NOTE — Progress Notes (Signed)
? ?Established Patient Office Visit ? ?Subjective   ?Patient ID: Deborah Mccullough, female    DOB: 11/07/1951  Age: 70 y.o. MRN: [id number] ? ?Chief Complaint  ?Patient presents with  ? Hypertension  ? ? ?HPI ? ?Hypertension- Pt denies chest pain, SOB, dizziness, or heart palpitations.  Taking meds as directed w/o problems.  Denies medication side effects.   ? ?Been under little bit more stress recently.  Her sister was diagnosed with breast cancer 4 months ago she lives about [place] from here.  She says that she is doing well overall.  She feels like the Lexapro has actually been helpful.  And her PHQ-9 score is 1 today.   ? ?She did have some questions about her Fosamax.  She did pick up the prescription but has not started it yet.  Her dentist has done a bone graft and she has a follow-up appointment coming up with him soon she may be getting a dental implant but is not sure. ? ?She is also reports some swelling in both knees. ? ?He would also like me to look in both ears today to see if she has cerumen blockage. ? ? ? ?ROS ? ?  ?Objective:  ?  ? ?BP (!) 150/68   Pulse (!) 58   Ht 5' 2" (1.575 m)   Wt 148 lb (67.1 kg)   SpO2 96%   BMI 27.07 kg/m?  ?BP Readings from Last 3 Encounters:  ?04/03/22 (!) 150/68  ?11/13/21 (!) 162/70  ?10/25/21 138/63  ? ?  ? ?Physical Exam ?Vitals and nursing note reviewed.  ?Constitutional:   ?   Appearance: She is well-developed.  ?HENT:  ?   Head: Normocephalic and atraumatic.  ?   Right Ear: There is impacted cerumen.  ?   Left Ear: There is impacted cerumen.  ?Eyes:  ?   Conjunctiva/sclera: Conjunctivae normal.  ?Cardiovascular:  ?   Rate and Rhythm: Normal rate and regular rhythm.  ?   Heart sounds: Normal heart sounds.  ?Pulmonary:  ?   Effort: Pulmonary effort is normal.  ?   Breath sounds: Normal breath sounds.  ?Skin: ?   General: Skin is warm and dry.  ?Neurological:  ?   Mental Status: She is alert and oriented to person, place, and time.  ?Psychiatric:     ?    Behavior: Behavior normal.  ? ? ? ?No results found for any visits on 04/03/22. ? ?Last metabolic panel ?Lab Results  ?Component Value Date  ? GLUCOSE 93 09/19/2021  ? NA 140 09/19/2021  ? K 4.4 09/19/2021  ? CL 104 09/19/2021  ? CO2 28 09/19/2021  ? BUN 14 09/19/2021  ? CREATININE 0.83 09/19/2021  ? EGFR 75 09/19/2021  ? CALCIUM 9.6 09/19/2021  ? PROT 7.0 09/19/2021  ? ALBUMIN 4.0 06/26/2017  ? BILITOT 0.5 09/19/2021  ? ALKPHOS 47 06/26/2017  ? AST 20 09/19/2021  ? ALT 14 09/19/2021  ? ?  ? ?The 10-year ASCVD risk score (Arnett DK, et al., 2019) is: 20% ? ?  ?Assessment & Plan:  ? ?Problem List Items Addressed This Visit   ? ?  ? Cardiovascular and Mediastinum  ? ESSENTIAL HYPERTENSION, BENIGN - Primary  ?  Not well controlled today and it was a little elevated back in November.  Repeat blood pressure still high.  We will add 25 mg of losartan have her follow back up in 2 to 3 weeks for recheck blood pressure. ? ?  ?  ?   Relevant Medications  ? losartan (COZAAR) 25 MG tablet  ? Other Relevant Orders  ? BASIC METABOLIC PANEL WITH GFR  ?  ? Musculoskeletal and Integument  ? Osteoporosis  ?  Discussed that it is certainly reasonable to hold the Fosamax until she has her dental work done.  Did encourage her to speak with her dentist about it can increase risk of osteoporosis of the jaw.  Some perfectly fine with her holding the medication for a few months if she needs to.  She will continue with her calcium and vitamin D which she has been taking regularly. ? ?  ?  ?  ? Other  ? Depression, recurrent ([place])  ?  PHQ- 9 score of  1 today.  Doing well on her lexapro.   ? ?  ?  ? ?Other Visit Diagnoses   ? ? Need for pneumococcal 20-valent conjugate vaccination      ? Relevant Orders  ? Pneumococcal conjugate vaccine 20-valent (Prevnar 20) (Completed)  ? Bilateral impacted cerumen      ? ?  ? ?Indication: Cerumen impaction of the ear(s) ?Medical necessity statement: On physical examination, cerumen impairs clinically  significant portions of the external auditory canal, and tympanic membrane. Noted obstructive, copious cerumen that cannot be removed without magnification and instrumentations.  ?Consent: Discussed benefits and risks of procedure and verbal consent obtained ?Procedure: Patient was prepped for the procedure. Utilized an otoscope to assess and take note of the ear canal, the tympanic membrane, and the presence, amount, and placement of the cerumen. Gentle water irrigation and soft plastic curette was utilized to remove cerumen.  ?Post procedure examination: shows cerumen was completely removed. Patient tolerated procedure well. The patient is made aware that they may experience temporary vertigo, temporary hearing loss, and temporary discomfort. If these symptom last for more than 24 hours to call the clinic or proceed to the ED.  ? ? ?Return in about 2 weeks (around 04/17/2022).  ? ? ?Beatrice Lecher, MD ? ?

## 2022-04-03 NOTE — Telephone Encounter (Signed)
Is call patient let her know that I did send over new prescription for blood pressure.  This is in addition to the metoprolol that she already takes.  Her blood pressure was high here today as well as back in November when she was here.  Please make sure she has a follow-up in 2 weeks to recheck her blood pressure ?

## 2022-04-03 NOTE — Progress Notes (Signed)
Pt states that she has not started taking the Fosamax. She is taking Calcium 1200 with D, and also started taking a B12 supplement a couple months ago.  ?

## 2022-04-03 NOTE — Assessment & Plan Note (Signed)
PHQ- 9 score of  1 today.  Doing well on her lexapro.   ?

## 2022-04-04 LAB — BASIC METABOLIC PANEL WITH GFR
BUN: 16 mg/dL (ref 7–25)
CO2: 28 mmol/L (ref 20–32)
Calcium: 9 mg/dL (ref 8.6–10.4)
Chloride: 106 mmol/L (ref 98–110)
Creat: 0.8 mg/dL (ref 0.60–1.00)
Glucose, Bld: 87 mg/dL (ref 65–99)
Potassium: 4.4 mmol/L (ref 3.5–5.3)
Sodium: 141 mmol/L (ref 135–146)
eGFR: 78 mL/min/{1.73_m2} (ref >=60)

## 2022-04-04 NOTE — Telephone Encounter (Signed)
Called and informed pt about new medication being sent for her (Losartan 25 mg). Pt advised that she can take this in addition to Metoprolol 100 mg. Pt voiced understanding and agreed.  ? ?She also wanted to ask Dr. Madilyn Fireman about taking Bone Nutrient by Derek Jack the ingredients are: ? ?Vit C 90 mg ? ?Vit D 810 IU ? ?Biotin 300 mcg ? ?Calcium  1000 mg ? ?Magnesium  400 mg ? ?Zinc  9 mg ? ?Copper  200 mcg ? ?Manganese  200 mcg ? ?Boron  2 mg ? ?Serving size is 4 vegetable capsules. Servings per container: 30 ? ?She wanted to know if Dr. Madilyn Fireman thought that this was a good supplement for her to take to help strengthen her bones.  ? ?

## 2022-04-04 NOTE — Progress Notes (Signed)
Your lab work is within acceptable range and there are no concerning findings.   ?

## 2022-04-04 NOTE — Telephone Encounter (Signed)
Spoke w/pt and told her that Dr. Madilyn Fireman stated it looks good. Ok for her to take. ?

## 2022-04-04 NOTE — Telephone Encounter (Signed)
Left message for patient to return call.

## 2022-04-04 NOTE — Telephone Encounter (Signed)
I think this looks good.   ?

## 2022-04-18 ENCOUNTER — Ambulatory Visit (INDEPENDENT_AMBULATORY_CARE_PROVIDER_SITE_OTHER): Payer: Medicare HMO | Admitting: Family Medicine

## 2022-04-18 VITALS — BP 156/78 | HR 62

## 2022-04-18 DIAGNOSIS — I10 Essential (primary) hypertension: Secondary | ICD-10-CM | POA: Diagnosis not present

## 2022-04-18 MED ORDER — LOSARTAN POTASSIUM 50 MG PO TABS: 50.0000 mg | ORAL_TABLET | Freq: Every day | ORAL | 1 refills | Status: DC

## 2022-04-18 NOTE — Progress Notes (Signed)
? ?  Established Patient Office Visit ? ?Subjective   ?Patient ID: Deborah Mccullough, female    DOB: [birth year]  Age: 71 y.o. MRN: [id number] ? ?Chief Complaint  ?Patient presents with  ? Hypertension  ? ? ?HPI ? ?Deborah Mccullough is here for blood pressure check. Denies chest pain, shortness of breath or dizziness.  ? ?Home readings -  ? ?122/62 ?129/64 ?145/62 ?123/62 ?134/67 ?137/62 ?123/67 ?120/65 ?122/77 ?129/67 ?134/65 ? ?ROS ? ?  ?Objective:  ?  ? ?BP (!) 150/72   Pulse 62   SpO2 97%  ? ? ?Physical Exam ? ? ?No results found for any visits on 04/18/22. ? ? ? ?The 10-year ASCVD risk score (Arnett DK, et al., 2019) is: 20% ? ?  ?Assessment & Plan:  ?Hypertension - Per Dr Madilyn Fireman, patient advised to increase the Losartan to 50 mg daily. Follow up in 2 weeks for blood pressure check with home blood pressure monitor.  ? ?Problem List Items Addressed This Visit   ? ? ESSENTIAL HYPERTENSION, BENIGN - Primary  ? ? ?Return in about 2 weeks (around 05/02/2022) for nurse visit blood pressure check and bring home blood pressure monitor. .  ? ? ?Lavell Luster, [place] ? ?

## 2022-04-18 NOTE — Progress Notes (Signed)
Quite a few of the home blood pressures actually look really good.  And it seems her cuff is fairly accurate.  But she still has several bladder elevated.  Go ahead and bump up the losartan to 50 mg to see if we can get about a five-point lowering on the blood pressure. ?

## 2022-04-18 NOTE — Patient Instructions (Signed)
Increase the Losartan to 50 mg once daily. Follow up in 2 weeks for a nurse visit blood pressure check. Bring home blood pressure monitor.  ?

## 2022-04-20 DIAGNOSIS — M25562 Pain in left knee: Secondary | ICD-10-CM | POA: Diagnosis not present

## 2022-04-20 DIAGNOSIS — M1712 Unilateral primary osteoarthritis, left knee: Secondary | ICD-10-CM | POA: Diagnosis not present

## 2022-04-23 ENCOUNTER — Other Ambulatory Visit: Payer: Self-pay | Admitting: *Deleted

## 2022-04-23 ENCOUNTER — Other Ambulatory Visit: Payer: Self-pay | Admitting: Family Medicine

## 2022-04-23 MED ORDER — ATORVASTATIN CALCIUM 20 MG PO TABS: 20.0000 mg | ORAL_TABLET | ORAL | 1 refills | Status: DC

## 2022-05-03 ENCOUNTER — Ambulatory Visit (INDEPENDENT_AMBULATORY_CARE_PROVIDER_SITE_OTHER): Payer: Medicare HMO | Admitting: Family Medicine

## 2022-05-03 VITALS — BP 116/53 | HR 57

## 2022-05-03 DIAGNOSIS — I10 Essential (primary) hypertension: Secondary | ICD-10-CM | POA: Diagnosis not present

## 2022-05-03 NOTE — Progress Notes (Signed)
Pt here for nurse BP check.  Pt denies CP, SOB, headaches, dizziness or missed doses of medications.  Pt brought her home machine for comparison.  First BP reading with pt's machine was 129/65.  Second reading with pt's machine was 125/67.  Since there is a greater than 10 point difference between our machine and pt's machine, advised pt that it would be best if she purchased a new BP machine for home use.  Recommended the [employer] brand.  Pt's spouse also states that when she saw the dentist recently, he advised the pt to not take the Fosamax, that she did not need it based on xrays of her jar.  Pt's spouse would like the PCPs opinion regarding this.  Charyl Bigger, CMA

## 2022-05-03 NOTE — Progress Notes (Signed)
Pt's spouse informed of recommendations.  Spouse expressed understanding.  Charyl Bigger, CMA

## 2022-05-03 NOTE — Progress Notes (Signed)
Blood pressure here today looks absolutely fantastic.  We will continue to monitor.  In regards to the Fosamax.  Sometimes the dentist will recommend holding it especially if they are going to have some type of dental work or procedure and that is perfectly fine.  But she is not actually taking it for her jaw.  There can be an increased risk for injury to the bone with a procedure while taking the medication so again if her dentist is planning on some type of procedure in the next 6 months they may recommend that she hold the medication.

## 2022-05-29 ENCOUNTER — Encounter: Payer: Self-pay | Admitting: Family Medicine

## 2022-06-12 ENCOUNTER — Other Ambulatory Visit: Payer: Self-pay | Admitting: Family Medicine

## 2022-06-12 DIAGNOSIS — F411 Generalized anxiety disorder: Secondary | ICD-10-CM

## 2022-06-21 ENCOUNTER — Other Ambulatory Visit: Payer: Self-pay | Admitting: Family Medicine

## 2022-07-11 ENCOUNTER — Other Ambulatory Visit: Payer: Self-pay | Admitting: Family Medicine

## 2022-07-11 DIAGNOSIS — F5101 Primary insomnia: Secondary | ICD-10-CM

## 2022-07-13 ENCOUNTER — Encounter: Payer: Self-pay | Admitting: Family Medicine

## 2022-07-20 ENCOUNTER — Other Ambulatory Visit: Payer: Self-pay | Admitting: Family Medicine

## 2022-08-13 DIAGNOSIS — M25512 Pain in left shoulder: Secondary | ICD-10-CM | POA: Diagnosis not present

## 2022-08-13 DIAGNOSIS — M25511 Pain in right shoulder: Secondary | ICD-10-CM | POA: Diagnosis not present

## 2022-08-13 DIAGNOSIS — M25552 Pain in left hip: Secondary | ICD-10-CM | POA: Diagnosis not present

## 2022-08-13 DIAGNOSIS — M25551 Pain in right hip: Secondary | ICD-10-CM | POA: Diagnosis not present

## 2022-08-13 DIAGNOSIS — M25562 Pain in left knee: Secondary | ICD-10-CM | POA: Diagnosis not present

## 2022-08-31 ENCOUNTER — Encounter: Payer: Self-pay | Admitting: Family Medicine

## 2022-09-11 NOTE — Telephone Encounter (Signed)
Hi Cindy can you look into the rheum ref and let me know what you see.

## 2022-09-14 ENCOUNTER — Other Ambulatory Visit: Payer: Self-pay | Admitting: Family Medicine

## 2022-09-14 DIAGNOSIS — Z1231 Encounter for screening mammogram for malignant neoplasm of breast: Secondary | ICD-10-CM
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
## 2022-09-20 ENCOUNTER — Ambulatory Visit: Payer: Medicare HMO | Attending: Physician Assistant | Admitting: Physician Assistant

## 2022-09-20 ENCOUNTER — Ambulatory Visit (INDEPENDENT_AMBULATORY_CARE_PROVIDER_SITE_OTHER): Payer: Medicare HMO

## 2022-09-20 ENCOUNTER — Encounter: Payer: Self-pay | Admitting: Physician Assistant

## 2022-09-20 VITALS — BP 153/83 | HR 56 | Ht 62.0 in | Wt 149.8 lb

## 2022-09-20 DIAGNOSIS — M79641 Pain in right hand: Secondary | ICD-10-CM

## 2022-09-20 DIAGNOSIS — M25552 Pain in left hip: Secondary | ICD-10-CM

## 2022-09-20 DIAGNOSIS — G4709 Other insomnia: Secondary | ICD-10-CM

## 2022-09-20 DIAGNOSIS — M810 Age-related osteoporosis without current pathological fracture: Secondary | ICD-10-CM

## 2022-09-20 DIAGNOSIS — M791 Myalgia, unspecified site: Secondary | ICD-10-CM

## 2022-09-20 DIAGNOSIS — F5101 Primary insomnia: Secondary | ICD-10-CM

## 2022-09-20 DIAGNOSIS — R5383 Other fatigue: Secondary | ICD-10-CM | POA: Diagnosis not present

## 2022-09-20 DIAGNOSIS — M25562 Pain in left knee: Secondary | ICD-10-CM

## 2022-09-20 DIAGNOSIS — M25551 Pain in right hip: Secondary | ICD-10-CM | POA: Diagnosis not present

## 2022-09-20 DIAGNOSIS — M25511 Pain in right shoulder: Secondary | ICD-10-CM

## 2022-09-20 DIAGNOSIS — M26622 Arthralgia of left temporomandibular joint: Secondary | ICD-10-CM

## 2022-09-20 DIAGNOSIS — G8929 Other chronic pain: Secondary | ICD-10-CM | POA: Diagnosis not present

## 2022-09-20 DIAGNOSIS — R7982 Elevated C-reactive protein (CRP): Secondary | ICD-10-CM | POA: Diagnosis not present

## 2022-09-20 DIAGNOSIS — I10 Essential (primary) hypertension: Secondary | ICD-10-CM

## 2022-09-20 DIAGNOSIS — M79642 Pain in left hand: Secondary | ICD-10-CM

## 2022-09-20 DIAGNOSIS — Z8669 Personal history of other diseases of the nervous system and sense organs: Secondary | ICD-10-CM

## 2022-09-20 DIAGNOSIS — M19032 Primary osteoarthritis, left wrist: Secondary | ICD-10-CM

## 2022-09-20 DIAGNOSIS — M2550 Pain in unspecified joint: Secondary | ICD-10-CM | POA: Diagnosis not present

## 2022-09-20 DIAGNOSIS — M25512 Pain in left shoulder: Secondary | ICD-10-CM

## 2022-09-20 DIAGNOSIS — E7800 Pure hypercholesterolemia, unspecified: Secondary | ICD-10-CM

## 2022-09-20 DIAGNOSIS — R768 Other specified abnormal immunological findings in serum: Secondary | ICD-10-CM

## 2022-09-20 DIAGNOSIS — F32A Depression, unspecified: Secondary | ICD-10-CM

## 2022-09-20 DIAGNOSIS — F419 Anxiety disorder, unspecified: Secondary | ICD-10-CM

## 2022-09-21 MED ORDER — PREDNISONE 5 MG PO TABS: 5.0000 mg | ORAL_TABLET | Freq: Every day | ORAL | 0 refills | Status: DC

## 2022-09-21 MED ORDER — PREDNISONE 1 MG PO TABS: 4.0000 mg | ORAL_TABLET | Freq: Every day | ORAL | 0 refills | Status: DC

## 2022-09-21 NOTE — Progress Notes (Signed)
CBC stable.  CMP WNL. ESR and CRP are WNL. Patient is currently taking prednisone 10 mg daily.   CK WNL. Complements WNL.

## 2022-09-21 NOTE — Telephone Encounter (Signed)
Ok to take tylenol as needed for headache relief.  The patient should avoid the use of NSAIDs while on prednisone.   After talking with Dr. Estanislado Pandy and reviewing x-ray results I would recommend a slower prednisone taper.   Patient can try reducing by 1 mg every month.  Ok to reduce to 9 mg daily.   Please send in the necessary 5 mg and 1 mg tablets for the patient.

## 2022-09-25 NOTE — Progress Notes (Signed)
I will review lab work in detail at new patient follow up visit.

## 2022-09-30 LAB — COMPLETE METABOLIC PANEL WITH GFR
AG Ratio: 1.8 (calc) (ref 1.0–2.5)
ALT: 17 U/L (ref 6–29)
AST: 21 U/L (ref 10–35)
Albumin: 4.6 g/dL (ref 3.6–5.1)
Alkaline phosphatase (APISO): 47 U/L (ref 37–153)
BUN: 19 mg/dL (ref 7–25)
CO2: 27 mmol/L (ref 20–32)
Calcium: 9.6 mg/dL (ref 8.6–10.4)
Chloride: 100 mmol/L (ref 98–110)
Creat: 0.88 mg/dL (ref 0.60–1.00)
Globulin: 2.6 g/dL (calc) (ref 1.9–3.7)
Glucose, Bld: 93 mg/dL (ref 65–99)
Potassium: 4.4 mmol/L (ref 3.5–5.3)
Sodium: 137 mmol/L (ref 135–146)
Total Bilirubin: 0.4 mg/dL (ref 0.2–1.2)
Total Protein: 7.2 g/dL (ref 6.1–8.1)
eGFR: 70 mL/min/{1.73_m2} (ref >=60)

## 2022-09-30 LAB — CBC WITH DIFFERENTIAL/PLATELET
Absolute Monocytes: 230 cells/uL (ref 200–950)
Basophils Absolute: 41 cells/uL (ref 0–200)
Basophils Relative: 0.5 %
Eosinophils Absolute: 8 cells/uL — ABNORMAL LOW (ref 15–500)
Eosinophils Relative: 0.1 %
HCT: 41 % (ref 35.0–45.0)
Hemoglobin: 13.6 g/dL (ref 11.7–15.5)
Lymphs Abs: 1123 cells/uL (ref 850–3900)
MCH: 29.3 pg (ref 27.0–33.0)
MCHC: 33.2 g/dL (ref 32.0–36.0)
MCV: 88.4 fL (ref 80.0–100.0)
MPV: 9.3 fL (ref 7.5–12.5)
Monocytes Relative: 2.8 %
Neutro Abs: 6798 cells/uL (ref 1500–7800)
Neutrophils Relative %: 82.9 %
Platelets: 239 10*3/uL (ref 140–400)
RBC: 4.64 10*6/uL (ref 3.80–5.10)
RDW: 15.3 % — ABNORMAL HIGH (ref 11.0–15.0)
Total Lymphocyte: 13.7 %
WBC: 8.2 10*3/uL (ref 3.8–10.8)

## 2022-09-30 LAB — SEDIMENTATION RATE: Sed Rate: 9 mm/h (ref 0–30)

## 2022-09-30 LAB — BETA-2 GLYCOPROTEIN ANTIBODIES
Beta-2 Glyco 1 IgA: <2 U/mL (ref <20.0)
Beta-2 Glyco 1 IgM: 2.9 U/mL (ref <20.0)
Beta-2 Glyco I IgG: <2 U/mL (ref <20.0)

## 2022-09-30 LAB — SJOGRENS SYNDROME-A EXTRACTABLE NUCLEAR ANTIBODY: SSA (Ro) (ENA) Antibody, IgG: >8 AI — AB

## 2022-09-30 LAB — C3 AND C4
C3 Complement: 141 mg/dL (ref 83–193)
C4 Complement: 33 mg/dL (ref 15–57)

## 2022-09-30 LAB — ANTI-NUCLEAR AB-TITER (ANA TITER)
ANA TITER: 1:40 {titer} — ABNORMAL HIGH
ANA Titer 1: 1:80 {titer} — ABNORMAL HIGH

## 2022-09-30 LAB — C-REACTIVE PROTEIN: CRP: 4.4 mg/L (ref <8.0)

## 2022-09-30 LAB — ANTI-DNA ANTIBODY, DOUBLE-STRANDED: ds DNA Ab: <1 IU/mL

## 2022-09-30 LAB — ANTI-SCLERODERMA ANTIBODY: Scleroderma (Scl-70) (ENA) Antibody, IgG: <1 AI

## 2022-09-30 LAB — ANTI-SMITH ANTIBODY: ENA SM Ab Ser-aCnc: <1 AI

## 2022-09-30 LAB — CARDIOLIPIN ANTIBODIES, IGG, IGM, IGA
Anticardiolipin IgA: <2 APL-U/mL (ref <20.0)
Anticardiolipin IgG: <2 GPL-U/mL (ref <20.0)
Anticardiolipin IgM: 2.7 MPL-U/mL (ref <20.0)

## 2022-09-30 LAB — 14-3-3 ETA PROTEIN: 14-3-3 eta Protein: 0.3 ng/mL — ABNORMAL HIGH (ref <0.2)

## 2022-09-30 LAB — SJOGRENS SYNDROME-B EXTRACTABLE NUCLEAR ANTIBODY: SSB (La) (ENA) Antibody, IgG: <1 AI

## 2022-09-30 LAB — RNP ANTIBODY: Ribonucleic Protein(ENA) Antibody, IgG: <1 AI

## 2022-09-30 LAB — CK: Total CK: 85 U/L (ref 29–143)

## 2022-09-30 LAB — ANA: Anti Nuclear Antibody (ANA): POSITIVE — AB

## 2022-10-03 ENCOUNTER — Telehealth (INDEPENDENT_AMBULATORY_CARE_PROVIDER_SITE_OTHER): Payer: Medicare HMO | Admitting: Physician Assistant

## 2022-10-03 ENCOUNTER — Encounter: Payer: Self-pay | Admitting: Physician Assistant

## 2022-10-03 VITALS — Temp 98.0°F

## 2022-10-03 DIAGNOSIS — D849 Immunodeficiency, unspecified: Secondary | ICD-10-CM

## 2022-10-03 DIAGNOSIS — J069 Acute upper respiratory infection, unspecified: Secondary | ICD-10-CM

## 2022-10-03 MED ORDER — IPRATROPIUM BROMIDE 0.06 % NA SOLN: 2.0000 | Freq: Four times a day (QID) | NASAL | 0 refills | Status: DC

## 2022-10-03 MED ORDER — BENZONATATE 200 MG PO CAPS: 200.0000 mg | ORAL_CAPSULE | Freq: Three times a day (TID) | ORAL | 0 refills | Status: DC | PRN

## 2022-10-03 NOTE — Progress Notes (Signed)
..  Virtual Visit via Video Note  I connected with Deborah Mccullough on 10/03/22 at 11:30 AM EDT by a video enabled telemedicine application and verified that I am speaking with the correct person using two identifiers.  Location: Patient: home Provider: clinic  .Marland KitchenParticipating in visit:  Patient: Deborah Mccullough  Provider: Iran Planas PA-C   I discussed the limitations of evaluation and management by telemedicine and the availability of in person appointments. The patient expressed understanding and agreed to proceed.  History of Present Illness: Pt is a 71 yo female on prednisone and being evaluated for autoimmune disease who complains of watery eyes, ST, dry cough, sinus pressure, congestion and fatigue since Monday. Negative for covid via home test. She is on prednisone '9mg'$  daily. She is taking coricidin and helped some. Mucus is clear. Cough does keep her up at night.   .. Active Ambulatory Problems    Diagnosis Date Noted   HYPERCHOLESTEROLEMIA 12/15/2009   Depression, recurrent ([place]) 11/21/2007   Migraine headache 09/22/2010   BENIGN PAROXYSMAL POSITIONAL VERTIGO 09/22/2010   ESSENTIAL HYPERTENSION, BENIGN 09/22/2010   ALLERGIC RHINITIS 12/13/2008   NECK PAIN, CHRONIC 10/30/2010   MAMMOGRAM, ABNORMAL 05/02/2010   Asymptomatic postmenopausal status 12/15/2009   Arthralgia of left temporomandibular joint 03/26/2018   Bilateral wrist pain 12/01/2018   Pain of right heel 12/01/2018   Localized primary osteoarthritis of carpometacarpal (CMC) joint of left wrist 07/17/2016   Acute radicular low back pain 11/24/2019   Right hip pain 11/24/2019   Primary insomnia 04/20/2020   GAD (generalized anxiety disorder) 10/26/2020   Osteoporosis 03/09/2022   Resolved Ambulatory Problems    Diagnosis Date Noted   Rash and nonspecific skin eruption 06/21/2016   Referred otalgia of left ear 03/26/2018   Past Medical History:  Diagnosis Date   Allergy    Anxiety    Depression    Hyperlipidemia     Hypertension    Insomnia    Menopausal syndrome    Migraine    Post-operative nausea and vomiting    Treadmill stress test negative for angina pectoris 10/08/2011       Observations/Objective: No acute distress Normal breathing No coughing Congested sounding Some sniffling   .Marland Kitchen Today's Vitals   10/03/22 1137  Temp: 98 F (36.7 C)  TempSrc: Oral   There is no height or weight on file to calculate BMI.    Assessment and Plan: Marland KitchenMarland KitchenDiagnoses and all orders for this visit:  Upper respiratory tract infection, unspecified type -     ipratropium (ATROVENT) 0.06 % nasal spray; Place 2 sprays into both nostrils 4 (four) times daily. -     benzonatate (TESSALON) 200 MG capsule; Take 1 capsule (200 mg total) by mouth 3 (three) times daily as needed for cough.  Immunosuppressed status ([place])   All symptoms pointing to viral infection  Discussed to call us if symptoms change for now will treat symptomatically with atrovent nasal spray, mucinex bid OTC, tessalon cough drops, rest and hydration.    Follow Up Instructions:    I discussed the assessment and treatment plan with the patient. The patient was provided an opportunity to ask questions and all were answered. The patient agreed with the plan and demonstrated an understanding of the instructions.   The patient was advised to call back or seek an in-person evaluation if the symptoms worsen or if the condition fails to improve as anticipated.   Iran Planas, PA-C

## 2022-10-04 ENCOUNTER — Ambulatory Visit: Payer: Medicare HMO | Admitting: Physician Assistant
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
## 2022-10-12 ENCOUNTER — Ambulatory Visit: Payer: Medicare HMO | Attending: Physician Assistant | Admitting: Physician Assistant

## 2022-10-12 ENCOUNTER — Encounter: Payer: Self-pay | Admitting: Physician Assistant

## 2022-10-12 ENCOUNTER — Other Ambulatory Visit: Payer: Self-pay

## 2022-10-12 VITALS — BP 126/79 | HR 61 | Resp 16 | Ht 62.0 in | Wt 154.0 lb

## 2022-10-12 DIAGNOSIS — E7800 Pure hypercholesterolemia, unspecified: Secondary | ICD-10-CM

## 2022-10-12 DIAGNOSIS — R7982 Elevated C-reactive protein (CRP): Secondary | ICD-10-CM

## 2022-10-12 DIAGNOSIS — M19042 Primary osteoarthritis, left hand: Secondary | ICD-10-CM

## 2022-10-12 DIAGNOSIS — F32A Depression, unspecified: Secondary | ICD-10-CM

## 2022-10-12 DIAGNOSIS — R768 Other specified abnormal immunological findings in serum: Secondary | ICD-10-CM | POA: Diagnosis not present

## 2022-10-12 DIAGNOSIS — F419 Anxiety disorder, unspecified: Secondary | ICD-10-CM | POA: Diagnosis not present

## 2022-10-12 DIAGNOSIS — Z8669 Personal history of other diseases of the nervous system and sense organs: Secondary | ICD-10-CM

## 2022-10-12 DIAGNOSIS — F5101 Primary insomnia: Secondary | ICD-10-CM

## 2022-10-12 DIAGNOSIS — M19032 Primary osteoarthritis, left wrist: Secondary | ICD-10-CM

## 2022-10-12 DIAGNOSIS — Z79899 Other long term (current) drug therapy: Secondary | ICD-10-CM | POA: Diagnosis not present

## 2022-10-12 DIAGNOSIS — M25511 Pain in right shoulder: Secondary | ICD-10-CM | POA: Diagnosis not present

## 2022-10-12 DIAGNOSIS — M19012 Primary osteoarthritis, left shoulder: Secondary | ICD-10-CM

## 2022-10-12 DIAGNOSIS — M810 Age-related osteoporosis without current pathological fracture: Secondary | ICD-10-CM | POA: Diagnosis not present

## 2022-10-12 DIAGNOSIS — M353 Polymyalgia rheumatica: Secondary | ICD-10-CM | POA: Diagnosis not present

## 2022-10-12 DIAGNOSIS — R5383 Other fatigue: Secondary | ICD-10-CM | POA: Diagnosis not present

## 2022-10-12 DIAGNOSIS — M25552 Pain in left hip: Secondary | ICD-10-CM

## 2022-10-12 DIAGNOSIS — M1712 Unilateral primary osteoarthritis, left knee: Secondary | ICD-10-CM

## 2022-10-12 DIAGNOSIS — M19041 Primary osteoarthritis, right hand: Secondary | ICD-10-CM

## 2022-10-12 DIAGNOSIS — G4709 Other insomnia: Secondary | ICD-10-CM

## 2022-10-12 DIAGNOSIS — M25512 Pain in left shoulder: Secondary | ICD-10-CM

## 2022-10-12 DIAGNOSIS — I10 Essential (primary) hypertension: Secondary | ICD-10-CM

## 2022-10-12 DIAGNOSIS — M25551 Pain in right hip: Secondary | ICD-10-CM | POA: Diagnosis not present

## 2022-10-12 DIAGNOSIS — G8929 Other chronic pain: Secondary | ICD-10-CM

## 2022-10-12 NOTE — Telephone Encounter (Signed)
Pending lab results (G6PD), patient will be starting plaquenil per Hazel Sams, PA-C.   Consent was obtained and sent to the scan center.  Thanks!

## 2022-10-16 LAB — GLUCOSE 6 PHOSPHATE DEHYDROGENASE: G-6PDH: 13.9 U/g Hgb (ref 7.0–20.5)

## 2022-10-17 ENCOUNTER — Ambulatory Visit (INDEPENDENT_AMBULATORY_CARE_PROVIDER_SITE_OTHER): Payer: Medicare HMO | Admitting: Family Medicine

## 2022-10-17 VITALS — BP 131/67 | HR 59 | Ht 62.0 in | Wt 154.1 lb

## 2022-10-17 DIAGNOSIS — Z23 Encounter for immunization: Secondary | ICD-10-CM

## 2022-10-17 DIAGNOSIS — Z0000 Encounter for general adult medical examination without abnormal findings: Secondary | ICD-10-CM | POA: Diagnosis not present

## 2022-10-17 MED ORDER — HYDROXYCHLOROQUINE SULFATE 200 MG PO TABS: ORAL_TABLET | ORAL | 0 refills | Status: DC

## 2022-10-17 NOTE — Progress Notes (Signed)
G6PDH WNL.  Ok to initiate plaquenil.  Please pend prescription.

## 2022-10-17 NOTE — Patient Instructions (Addendum)
[facility] Maintenance Summary and Written Plan of Care  [name] ,  Thank you for allowing me to perform your Medicare Annual Wellness Visit and for your ongoing commitment to your health.   Health Maintenance & Immunization History Health Maintenance  Topic Date Due   COVID-19 Vaccine (5 - Pfizer risk series) 11/02/2022 (Originally 08/21/2021)   Zoster Vaccines- Shingrix (1 of 2) 01/17/2023 (Originally 11/08/1970)   TETANUS/TDAP  10/18/2023 (Originally 12/16/2019)   Medicare Annual Wellness (AWV)  10/18/2023   MAMMOGRAM  11/17/2023   COLONOSCOPY (Pts 45-62yr Insurance coverage will need to be confirmed)  01/11/2025   Pneumonia Vaccine 71 Years old  Completed   INFLUENZA VACCINE  Completed   DEXA SCAN  Completed   Hepatitis C Screening  Completed   HPV VACCINES  Aged Out   Immunization History  Administered Date(s) Administered   Fluad Quad(high Dose 73+) 09/14/2020, 09/19/2021, 10/17/2022   Influenza Split 09/20/2011, 09/24/2012   Influenza Whole 10/12/2008, 10/30/2010   Influenza, High Dose Seasonal PF 10/31/2016, 09/30/2017, 10/10/2018, 09/22/2019   Influenza,inj,Quad PF,6+ Mos 09/02/2013, 10/19/2014, 10/31/2015   Influenza,inj,quad, With Preservative 09/16/2018   Influenza-Unspecified 09/16/2018   PFIZER(Purple Top)SARS-COV-2 Vaccination 01/06/2020, 01/27/2020, 09/27/2020, 06/26/2021   PNEUMOCOCCAL CONJUGATE-20 04/03/2022   Pneumococcal Conjugate-13 10/31/2015   Pneumococcal Polysaccharide-23 12/24/2016   Td 12/15/2009   Zoster, Live 09/20/2011    These are the patient goals that we discussed:  Goals Addressed               This Visit's Progress     Patient Stated (pt-stated)        10/17/2022 AWV Goal: Exercise for General Health  Patient will verbalize understanding of the benefits of increased physical activity: Exercising regularly is important. It will improve your overall fitness, flexibility, and endurance. Regular  exercise also will improve your overall health. It can help you control your weight, reduce stress, and improve your bone density. Over the next year, patient will increase physical activity as tolerated with a goal of at least 150 minutes of moderate physical activity per week.  You can tell that you are exercising at a moderate intensity if your heart starts beating faster and you start breathing faster but can still hold a conversation. Moderate-intensity exercise ideas include: Walking 1 mile (1.6 km) in about 15 minutes Biking Hiking Golfing Dancing Water aerobics Patient will verbalize understanding of everyday activities that increase physical activity by providing examples like the following: Yard work, such as: PSales promotion account executiveGardening Washing windows or floors Patient will be able to explain general safety guidelines for exercising:  Before you start a new exercise program, talk with your health care provider. Do not exercise so much that you hurt yourself, feel dizzy, or get very short of breath. Wear comfortable clothes and wear shoes with good support. Drink plenty of water while you exercise to prevent dehydration or heat stroke. Work out until your breathing and your heartbeat get faster.          This is a list of Health Maintenance Items that are overdue or due now: Influenza vaccine Shingles vaccine Td vaccineThere are no preventive care reminders to display for this patient.    Orders/Referrals Placed Today: Orders Placed This Encounter  Procedures   Flu Vaccine QUAD High Dose(Fluad)   (Contact our referral department at [phone] you have not spoken with someone about your referral appointment within  the next 5 days)    Follow-up Plan Follow-up with Hali Marry, MD as planned Schedule shingles vaccine and tetanus shot at the pharmacy. Medicare wellness  visit in one year.  AVS printed and given to patient.      Health Maintenance, Female Adopting a healthy lifestyle and getting preventive care are important in promoting health and wellness. Ask your health care provider about: The right schedule for you to have regular tests and exams. Things you can do on your own to prevent diseases and keep yourself healthy. What should I know about diet, weight, and exercise? Eat a healthy diet  Eat a diet that includes plenty of vegetables, fruits, low-fat dairy products, and lean protein. Do not eat a lot of foods that are high in solid fats, added sugars, or sodium. Maintain a healthy weight Body mass index (BMI) is used to identify weight problems. It estimates body fat based on height and weight. Your health care provider can help determine your BMI and help you achieve or maintain a healthy weight. Get regular exercise Get regular exercise. This is one of the most important things you can do for your health. Most adults should: Exercise for at least 150 minutes each week. The exercise should increase your heart rate and make you sweat (moderate-intensity exercise). Do strengthening exercises at least twice a week. This is in addition to the moderate-intensity exercise. Spend less time sitting. Even light physical activity can be beneficial. Watch cholesterol and blood lipids Have your blood tested for lipids and cholesterol at 73 years of age, then have this test every 5 years. Have your cholesterol levels checked more often if: Your lipid or cholesterol levels are high. You are older than 73 years of age. You are at high risk for heart disease. What should I know about cancer screening? Depending on your health history and family history, you may need to have cancer screening at various ages. This may include screening for: Breast cancer. Cervical cancer. Colorectal cancer. Skin cancer. Lung cancer. What should I know about heart  disease, diabetes, and high blood pressure? Blood pressure and heart disease High blood pressure causes heart disease and increases the risk of stroke. This is more likely to develop in people who have high blood pressure readings or are overweight. Have your blood pressure checked: Every 3-5 years if you are 8-22 years of age. Every year if you are 64 years old or older. Diabetes Have regular diabetes screenings. This checks your fasting blood sugar level. Have the screening done: Once every three years after age 29 if you are at a normal weight and have a low risk for diabetes. More often and at a younger age if you are overweight or have a high risk for diabetes. What should I know about preventing infection? Hepatitis B If you have a higher risk for hepatitis B, you should be screened for this virus. Talk with your health care provider to find out if you are at risk for hepatitis B infection. Hepatitis C Testing is recommended for: Everyone born from [birth year] through 1967. Anyone with known risk factors for hepatitis C. Sexually transmitted infections (STIs) Get screened for STIs, including gonorrhea and chlamydia, if: You are sexually active and are younger than 73 years of age. You are older than 73 years of age and your health care provider tells you that you are at risk for this type of infection. Your sexual activity has changed since you were last screened, and you  are at increased risk for chlamydia or gonorrhea. Ask your health care provider if you are at risk. Ask your health care provider about whether you are at high risk for HIV. Your health care provider may recommend a prescription medicine to help prevent HIV infection. If you choose to take medicine to prevent HIV, you should first get tested for HIV. You should then be tested every 3 months for as long as you are taking the medicine. Pregnancy If you are about to stop having your period (premenopausal) and you may become  pregnant, seek counseling before you get pregnant. Take 400 to 800 micrograms (mcg) of folic acid every day if you become pregnant. Ask for birth control (contraception) if you want to prevent pregnancy. Osteoporosis and menopause Osteoporosis is a disease in which the bones lose minerals and strength with aging. This can result in bone fractures. If you are 81 years old or older, or if you are at risk for osteoporosis and fractures, ask your health care provider if you should: Be screened for bone loss. Take a calcium or vitamin D supplement to lower your risk of fractures. Be given hormone replacement therapy (HRT) to treat symptoms of menopause. Follow these instructions at home: Alcohol use Do not drink alcohol if: Your health care provider tells you not to drink. You are pregnant, may be pregnant, or are planning to become pregnant. If you drink alcohol: Limit how much you have to: 0-1 drink a day. Know how much alcohol is in your drink. In the [place] one drink equals one 12 oz bottle of beer (355 mL), one 5 oz glass of wine (148 mL), or one 1 oz glass of hard liquor (44 mL). Lifestyle Do not use any products that contain nicotine or tobacco. These products include cigarettes, chewing tobacco, and vaping devices, such as e-cigarettes. If you need help quitting, ask your health care provider. Do not use street drugs. Do not share needles. Ask your health care provider for help if you need support or information about quitting drugs. General instructions Schedule regular health, dental, and eye exams. Stay current with your vaccines. Tell your health care provider if: You often feel depressed. You have ever been abused or do not feel safe at home. Summary Adopting a healthy lifestyle and getting preventive care are important in promoting health and wellness. Follow your health care provider's instructions about healthy diet, exercising, and getting tested or screened for  diseases. Follow your health care provider's instructions on monitoring your cholesterol and blood pressure. This information is not intended to replace advice given to you by your health care provider. Make sure you discuss any questions you have with your health care provider. Document Revised: 04/24/2021 Document Reviewed: 04/24/2021 Elsevier Patient Education  [place].

## 2022-10-17 NOTE — Telephone Encounter (Signed)
[id number] WNL.  Ok to initiate plaquenil.    Per office note on 10/12/2022:  Plaquenil 200 mg 1 tablet by mouth twice daily Monday through Friday.
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
## 2022-10-18 ENCOUNTER — Other Ambulatory Visit: Payer: Self-pay | Admitting: Physician Assistant

## 2022-10-18 NOTE — Telephone Encounter (Signed)
Attempted to contact the patient and left message for patient to call the office to verify what dose she is on and when she is due to taper down to 8 mg.

## 2022-10-18 NOTE — Telephone Encounter (Signed)
Please clarify when she will be reducing to 8 mg?

## 2022-10-18 NOTE — Telephone Encounter (Signed)
Next Visit: 11/23/2022  Last Visit: 10/12/2022  Dx: Polymyalgia rheumatica   Current Dose per office note on 10/12/2022: She will remain on prednisone 9 mg daily and taper by 1 mg every month as tolerated.   Okay to refill Prednisone?

## 2022-10-18 NOTE — Telephone Encounter (Signed)
Patient states that she is still taking 9 mg/daily. Patient states she is planning on tapering down to 8 mg soon but is not sure when. Patient states she will call us back tomorrow to let us know exact date.

## 2022-10-19 NOTE — Telephone Encounter (Signed)
Patient states she is decreasing down to '8mg'$  of prednisone daily. She is aware she will have extra tablets of '1mg'$  tablets based on the refill from yesterday.

## 2022-10-24 DIAGNOSIS — Z79899 Other long term (current) drug therapy: Secondary | ICD-10-CM | POA: Diagnosis not present

## 2022-10-31 ENCOUNTER — Encounter: Payer: Self-pay | Admitting: Family Medicine

## 2022-10-31 ENCOUNTER — Other Ambulatory Visit: Payer: Self-pay | Admitting: Family Medicine

## 2022-10-31 ENCOUNTER — Ambulatory Visit (INDEPENDENT_AMBULATORY_CARE_PROVIDER_SITE_OTHER): Payer: Medicare HMO | Admitting: Family Medicine

## 2022-10-31 VITALS — BP 145/65 | HR 59 | Ht 62.0 in | Wt 154.0 lb

## 2022-10-31 DIAGNOSIS — Z79899 Other long term (current) drug therapy: Secondary | ICD-10-CM | POA: Diagnosis not present

## 2022-10-31 DIAGNOSIS — M810 Age-related osteoporosis without current pathological fracture: Secondary | ICD-10-CM | POA: Diagnosis not present

## 2022-10-31 DIAGNOSIS — I10 Essential (primary) hypertension: Secondary | ICD-10-CM | POA: Diagnosis not present

## 2022-10-31 DIAGNOSIS — M353 Polymyalgia rheumatica: Secondary | ICD-10-CM | POA: Diagnosis not present

## 2022-10-31 DIAGNOSIS — F339 Major depressive disorder, recurrent, unspecified: Secondary | ICD-10-CM

## 2022-10-31 DIAGNOSIS — R69 Illness, unspecified: Secondary | ICD-10-CM | POA: Diagnosis not present

## 2022-10-31 NOTE — Patient Instructions (Signed)
Follow up in 2 weeks for bp check with nurse. 

## 2022-10-31 NOTE — Assessment & Plan Note (Signed)
Continue Lexapro but will need to monitor for QT prolongation with combination of Plaquenil.

## 2022-10-31 NOTE — Assessment & Plan Note (Signed)
Pressure is slightly elevated today.  We will keep an eye on it and plan to recheck at next visit.

## 2022-10-31 NOTE — Assessment & Plan Note (Signed)
She has been holding her Fosamax currently with everything going on she started feeling achy initially and thought maybe it was becoming from the medication.  Then after she stopped the Fosamax is when she started feeling worse and then was eventually diagnosed with polymyalgia rheumatica.  So she is wondering when to restart the medication.  We discussed maybe giving it a couple of months to get the Plaquenil in her system and then hopefully be able to come down a little bit on her prednisone and then maybe we can consider retrying a bisphosphonate or even considering switching to Prolia.  Can address again at her follow-up in 3 months.

## 2022-10-31 NOTE — Assessment & Plan Note (Addendum)
Now on Plaquenil and low-dose prednisone.  Hopefully will be able to taper down off the prednisone over the next several months.  We can circle back to discussing treating her osteoporosis at that time.  She also had a question about the shingles vaccine.  I also think she would benefit from being on a lower dose of prednisone before we give the vaccine so that she gets maximal benefit.  Knowing that she is on immunosuppressant therapy it also increases her risk for actually having a shingles breakout.\  Is also concerned about the possibility of interaction QT prolongation between her Lexapro and her Plaquenil.  So we discussed doing an EKG today to evaluate for any underlying QT prolongation she is already on both medications.  EKG today shows rate of 58 bpm, sinus bradycardia with normal sinus rhythm no acute changes.  QT corrected interval is 426 ms.  We discussed that we would keep an eye on this.

## 2022-10-31 NOTE — Progress Notes (Addendum)
Established Patient Office Visit  Subjective   Patient ID: Deborah Mccullough, female    DOB: 1951-06-06  Age: 71 y.o. MRN: [id number]  Chief Complaint  Patient presents with   Follow-up    Discuss medications      HPI  Hypertension- Pt denies chest pain, SOB, dizziness, or heart palpitations.  Taking meds as directed w/o problems.  Denies medication side effects.    Osteoporosis - last DEXA scan was in March 2023.  T score of -2.6.  She initially came off of her bisphosphonate because of body aches and pains and has not restarted it since her diagnosis of PMR.  She had significant increase in body aches and pains and was evaluated by her sports med doctor, Dr. Delilah Shan and then referred to rheumatology.  Saw rheumatology in October for polymyalgia rheumatica.  She also has a positive ANA. There is lexapro interaction with the plaquenil.  She will be on steroid for 6 months.  She is down to '8mg'$  of prednisone.  She is starting to feel better.  She wants her left ear checked.  She has been occ using the debrox drops.    She has gone for her first eye exam after starting the Plaquenil.  And will follow back up in 6 months.    ROS    Objective:     BP (!) 145/65   Pulse (!) 59   Ht '5\' 2"'$  (1.575 m)   Wt 154 lb (69.9 kg)   SpO2 99%   BMI 28.17 kg/m    Physical Exam Vitals and nursing note reviewed.  Constitutional:      Appearance: She is well-developed.  HENT:     Head: Normocephalic and atraumatic.  Cardiovascular:     Rate and Rhythm: Normal rate and regular rhythm.     Heart sounds: Normal heart sounds.  Pulmonary:     Effort: Pulmonary effort is normal.     Breath sounds: Normal breath sounds.  Skin:    General: Skin is warm and dry.  Neurological:     Mental Status: She is alert and oriented to person, place, and time.  Psychiatric:        Behavior: Behavior normal.      No results found for any visits on 10/31/22.    The 10-year ASCVD risk score  (Arnett DK, et al., 2019) is: 18.8%    Assessment & Plan:   Problem List Items Addressed This Visit       Cardiovascular and Mediastinum   Essential hypertension, benign - Primary    Pressure is slightly elevated today.  We will keep an eye on it and plan to recheck at next visit.        Musculoskeletal and Integument   Osteoporosis    She has been holding her Fosamax currently with everything going on she started feeling achy initially and thought maybe it was becoming from the medication.  Then after she stopped the Fosamax is when she started feeling worse and then was eventually diagnosed with polymyalgia rheumatica.  So she is wondering when to restart the medication.  We discussed maybe giving it a couple of months to get the Plaquenil in her system and then hopefully be able to come down a little bit on her prednisone and then maybe we can consider retrying a bisphosphonate or even considering switching to Prolia.  Can address again at her follow-up in 3 months.        Other  PMR (polymyalgia rheumatica) (HCC)    Now on Plaquenil and low-dose prednisone.  Hopefully will be able to taper down off the prednisone over the next several months.  We can circle back to discussing treating her osteoporosis at that time.  She also had a question about the shingles vaccine.  I also think she would benefit from being on a lower dose of prednisone before we give the vaccine so that she gets maximal benefit.  Knowing that she is on immunosuppressant therapy it also increases her risk for actually having a shingles breakout.\  Is also concerned about the possibility of interaction QT prolongation between her Lexapro and her Plaquenil.  So we discussed doing an EKG today to evaluate for any underlying QT prolongation she is already on both medications.  EKG today shows rate of 58 bpm, sinus bradycardia with normal sinus rhythm no acute changes.  QT corrected interval is 426 ms.  We discussed that we  would keep an eye on this.      Other Visit Diagnoses     Medication management       Relevant Orders   EKG 12-Lead       Return in about 3 months (around 01/31/2023) for BP  and  ear lavage please schedule extra time.    Beatrice Lecher, MD

## 2022-11-01 ENCOUNTER — Encounter: Payer: Self-pay | Admitting: Family Medicine

## 2022-11-01 MED ORDER — TRIAMCINOLONE ACETONIDE 0.1 % EX CREA: 1.0000 | TOPICAL_CREAM | Freq: Two times a day (BID) | CUTANEOUS | 0 refills | Status: DC

## 2022-11-01 NOTE — Telephone Encounter (Signed)
Meds ordered this encounter  Medications   triamcinolone cream (KENALOG) 0.1 %    Sig: Apply 1 Application topically 2 (two) times daily.    Dispense:  45 g    Refill:  0   If rash continue or getting worse then we may need to use prednisone? Anything new? New meds/supplements, etc?

## 2022-11-02 ENCOUNTER — Telehealth: Payer: Self-pay

## 2022-11-02 NOTE — Telephone Encounter (Signed)
Patient left VM stating she emailed you about her rash cause it has gotten worse and now the rash is all over and she wants to know what to do next please advise.

## 2022-11-02 NOTE — Telephone Encounter (Signed)
Okay, I think at this point lets refer to dermatology.  See if she has a preference for provider in location.

## 2022-11-02 NOTE — Telephone Encounter (Signed)
I returned patient's call.  I spoke with patient and her husband.  I advised her to discontinue hydroxychloroquine.  Patient has prednisone at home.  I advised her to take prednisone 20 mg for 2 to 4 days until the rash resolves and then she can decrease the prednisone to 15 mg p.o. daily for the next 2 to 4 days followed by prednisone 10 mg p.o. daily for the next 2 to 4 days and then she can go to original dose of prednisone of 8 mg p.o. daily.  She was also advised to take over-the-counter Benadryl for itching.  Please call patient on Monday to check on the response to treatment.

## 2022-11-05 NOTE — Telephone Encounter (Signed)
Spoke with patient and she states the rash is red and she feels"they are coming out everywhere". She states that she is itching all over her body. Patient states she has spots on her neck and on her face. Patient states she increased her prednisone as instructed and has not gotten any better. Patient states she has taken Benadryl, 6 doses over the weekend. Patient states she is unable to take it during the day today as she is working. Please advise.

## 2022-11-06 ENCOUNTER — Ambulatory Visit (INDEPENDENT_AMBULATORY_CARE_PROVIDER_SITE_OTHER): Payer: Medicare HMO | Admitting: Family Medicine

## 2022-11-06 ENCOUNTER — Encounter: Payer: Self-pay | Admitting: Family Medicine

## 2022-11-06 VITALS — BP 144/65 | HR 67 | Ht 62.0 in | Wt 157.1 lb

## 2022-11-06 DIAGNOSIS — R21 Rash and other nonspecific skin eruption: Secondary | ICD-10-CM

## 2022-11-06 DIAGNOSIS — T50905A Adverse effect of unspecified drugs, medicaments and biological substances, initial encounter: Secondary | ICD-10-CM

## 2022-11-06 MED ORDER — METHYLPREDNISOLONE ACETATE 80 MG/ML IJ SUSP
80.0000 mg | Freq: Once | INTRAMUSCULAR | Status: AC
  Administered 2022-11-06: 80 mg via INTRAMUSCULAR

## 2022-11-06 MED ORDER — PREDNISONE 20 MG PO TABS: ORAL_TABLET | ORAL | 0 refills | Status: AC

## 2022-11-06 MED ORDER — METHYLPREDNISOLONE SODIUM SUCC 125 MG IJ SOLR
125.0000 mg | Freq: Once | INTRAMUSCULAR | Status: AC
  Administered 2022-11-06: 125 mg via INTRAMUSCULAR

## 2022-11-06 NOTE — Progress Notes (Signed)
Acute Office Visit  Subjective:     Patient ID: Deborah Mccullough, female    DOB: 1951/01/03, 71 y.o.   MRN: [id number]  Chief Complaint  Patient presents with   Rash    Itchy and redness    HPI Patient is in today for rash.  She had noticed a little bit of a rash on her upper abdomen when she saw me about a week ago on November 15.  Sent over prescription for triamcinolone cream to help with the itching and irritation.  She was already on oral prednisone.  She denied any new prescriptions or supplements etc.  She ended up calling her rheumatologist who felt like it was an allergic reaction to the Plaquenil and was told to stop it immediately.  And her prednisone was up to 20 mg for 2 days and then now is down to 15 mg.  She called the rheumatologist back today because she felt like the rash was getting worse.  It is intensely itchy.  She is also been taking 2 Benadryl every 4-6 hours.  The topical triamcinolone cream is no longer helping.  She has not had any chest pain discomfort problems swallowing or shortness of breath.  But the rash has spread up onto her face and scalp.  ROS      Objective:    BP (!) 144/65   Pulse 67   Ht '5\' 2"'$  (1.575 m)   Wt 157 lb 1 oz (71.2 kg)   SpO2 96%   BMI 28.73 kg/m    Physical Exam Vitals reviewed.  Constitutional:      Appearance: She is well-developed.  HENT:     Head: Normocephalic and atraumatic.  Eyes:     Conjunctiva/sclera: Conjunctivae normal.  Cardiovascular:     Rate and Rhythm: Normal rate.  Pulmonary:     Effort: Pulmonary effort is normal.  Skin:    General: Skin is dry.     Coloration: Skin is not pale.     Comments: Erythematous maculopapular rash over the torso, upper arms neck face she will cheeks and scalp.  No pustules or sloughing skin.  Neurological:     Mental Status: She is alert and oriented to person, place, and time.  Psychiatric:        Behavior: Behavior normal.     No results found for any visits on  11/06/22.      Assessment & Plan:   Problem List Items Addressed This Visit   None Visit Diagnoses     Rash    -  Primary   Relevant Medications   methylPREDNISolone sodium succinate (SOLU-MEDROL) 125 mg/2 mL injection 125 mg (Completed)   methylPREDNISolone acetate (DEPO-MEDROL) injection 80 mg (Completed)   Adverse effect of drug, initial encounter           Rash likely secondary to Plaquenil.  We will add Plaquenil to allergy list.  We will give her Solu-Medrol 125 and 80 mg of Depo-Medrol.  Starting on Saturday she will start with a 40 mg taper of prednisone over 8 days.  If she is not improving then please give our office a call if she gets worse then she will need to go to the emergency room.  We also discussed stopping the triamcinolone and any topical antihistamines and just using a good dye free, perfume free moisturizer.  Okay to continue oral Benadryl 2 tabs every 4-6 hours as needed for itching.  Also recommend using cool packs for itching.  Meds ordered this encounter  Medications   predniSONE (DELTASONE) 20 MG tablet    Sig: Take 2 tablets (40 mg total) by mouth daily with breakfast for 4 days, THEN 1 tablet (20 mg total) daily with breakfast for 4 days.    Dispense:  12 tablet    Refill:  0   methylPREDNISolone sodium succinate (SOLU-MEDROL) 125 mg/2 mL injection 125 mg   methylPREDNISolone acetate (DEPO-MEDROL) injection 80 mg    No follow-ups on file.  Beatrice Lecher, MD

## 2022-11-06 NOTE — Telephone Encounter (Signed)
Patient should see her PCP.  She may need Depo-Medrol injection and a stronger steroid taper.

## 2022-11-06 NOTE — Patient Instructions (Addendum)
You were given 2 injections today.  1 is called Solu-Medrol and is a more quick acting steroid.  It usually kicks in after about 12 hours after it is given.  The second injection is called Depo-Medrol and will last for several days.  Also sent over prescription to the pharmacy for prednisone.  Please go ahead and pick it up before the holiday if you can. Please start it Saturday morning and let me know how you are doing on Monday.   Recommend moisturizing your skin daily with a dye free, perfume free moisturizer such as CeraVe, Cetaphil, or pHisoDerm.  Aveeno also makes 1, just make sure it says dye free and perfume free.  These can be safely used all over your body.  Please stop the triamcinolone cream.  Stop any topical Benadryl products.  Okay to continue Benadryl, 2 tabs every 4-6 hours as needed for itching.

## 2022-11-06 NOTE — Telephone Encounter (Signed)
Patient scheduled.

## 2022-11-11 ENCOUNTER — Encounter: Payer: Self-pay | Admitting: Family Medicine

## 2022-11-11 DIAGNOSIS — T50905A Adverse effect of unspecified drugs, medicaments and biological substances, initial encounter: Secondary | ICD-10-CM

## 2022-11-11 DIAGNOSIS — L299 Pruritus, unspecified: Secondary | ICD-10-CM

## 2022-11-11 DIAGNOSIS — R21 Rash and other nonspecific skin eruption: Secondary | ICD-10-CM
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
## 2022-11-13 ENCOUNTER — Other Ambulatory Visit: Payer: Self-pay | Admitting: Family Medicine

## 2022-11-13 DIAGNOSIS — F5101 Primary insomnia: Secondary | ICD-10-CM

## 2022-11-13 DIAGNOSIS — T50905A Adverse effect of unspecified drugs, medicaments and biological substances, initial encounter: Secondary | ICD-10-CM | POA: Diagnosis not present

## 2022-11-13 DIAGNOSIS — L299 Pruritus, unspecified: Secondary | ICD-10-CM | POA: Diagnosis not present

## 2022-11-13 DIAGNOSIS — R21 Rash and other nonspecific skin eruption: Secondary | ICD-10-CM | POA: Diagnosis not present

## 2022-11-13 NOTE — Telephone Encounter (Signed)
Last written 07/11/22 #90 no refills  Last appt 11/06/2022

## 2022-11-14 ENCOUNTER — Encounter: Payer: Self-pay | Admitting: Family Medicine

## 2022-11-14 LAB — COMPLETE METABOLIC PANEL WITH GFR
AG Ratio: 1.7 (calc) (ref 1.0–2.5)
ALT: 17 U/L (ref 6–29)
AST: 16 U/L (ref 10–35)
Albumin: 4.1 g/dL (ref 3.6–5.1)
Alkaline phosphatase (APISO): 47 U/L (ref 37–153)
BUN: 23 mg/dL (ref 7–25)
CO2: 26 mmol/L (ref 20–32)
Calcium: 9 mg/dL (ref 8.6–10.4)
Chloride: 102 mmol/L (ref 98–110)
Creat: 0.91 mg/dL (ref 0.60–1.00)
Globulin: 2.4 g/dL (calc) (ref 1.9–3.7)
Glucose, Bld: 84 mg/dL (ref 65–99)
Potassium: 4.1 mmol/L (ref 3.5–5.3)
Sodium: 137 mmol/L (ref 135–146)
Total Bilirubin: 0.5 mg/dL (ref 0.2–1.2)
Total Protein: 6.5 g/dL (ref 6.1–8.1)
eGFR: 67 mL/min/{1.73_m2} (ref >=60)

## 2022-11-14 LAB — CBC WITH DIFFERENTIAL/PLATELET
Absolute Monocytes: 785 cells/uL (ref 200–950)
Basophils Absolute: 92 cells/uL (ref 0–200)
Basophils Relative: 0.6 %
Eosinophils Absolute: 200 cells/uL (ref 15–500)
Eosinophils Relative: 1.3 %
HCT: 38.7 % (ref 35.0–45.0)
Hemoglobin: 13.2 g/dL (ref 11.7–15.5)
Lymphs Abs: 1786 cells/uL (ref 850–3900)
MCH: 30.3 pg (ref 27.0–33.0)
MCHC: 34.1 g/dL (ref 32.0–36.0)
MCV: 88.8 fL (ref 80.0–100.0)
MPV: 9.4 fL (ref 7.5–12.5)
Monocytes Relative: 5.1 %
Neutro Abs: 12536 cells/uL — ABNORMAL HIGH (ref 1500–7800)
Neutrophils Relative %: 81.4 %
Platelets: 224 10*3/uL (ref 140–400)
RBC: 4.36 10*6/uL (ref 3.80–5.10)
RDW: 15 % (ref 11.0–15.0)
Total Lymphocyte: 11.6 %
WBC: 15.4 10*3/uL — ABNORMAL HIGH (ref 3.8–10.8)

## 2022-11-14 LAB — TSH: TSH: 1.1 mIU/L (ref 0.40–4.50)

## 2022-11-14 MED ORDER — DOXYCYCLINE HYCLATE 100 MG PO TABS: 100.0000 mg | ORAL_TABLET | Freq: Two times a day (BID) | ORAL | 0 refills | Status: DC

## 2022-11-14 NOTE — Progress Notes (Signed)
Hi Deborah Mccullough, your metabolic panel and thyroid look great.  Your white blood cells which is part of your immune system are elevated and seem to be reacting.  Actually like to place you on doxycycline which is an antibiotic for 7 days and see if you feel like it is helpful.  I will send this over to your pharmacy.  Sent to [facility].

## 2022-11-14 NOTE — Addendum Note (Signed)
Addended by: Beatrice Lecher D on: 11/14/2022 07:47 AM   Modules accepted: Orders

## 2022-11-19 ENCOUNTER — Ambulatory Visit
Admission: RE | Admit: 2022-11-19 | Discharge: 2022-11-19 | Disposition: A | Discharge status: Home / Self Care | Payer: Medicare HMO | Source: Ambulatory Visit | Attending: Family Medicine | Admitting: Family Medicine

## 2022-11-19 ENCOUNTER — Other Ambulatory Visit: Payer: Self-pay | Admitting: *Deleted

## 2022-11-19 DIAGNOSIS — Z79899 Other long term (current) drug therapy: Secondary | ICD-10-CM | POA: Diagnosis not present

## 2022-11-19 DIAGNOSIS — Z1231 Encounter for screening mammogram for malignant neoplasm of breast: Secondary | ICD-10-CM

## 2022-11-19 LAB — CBC WITH DIFFERENTIAL/PLATELET
Absolute Monocytes: 675 cells/uL (ref 200–950)
Basophils Absolute: 90 cells/uL (ref 0–200)
Basophils Relative: 0.6 %
Eosinophils Absolute: 165 cells/uL (ref 15–500)
Eosinophils Relative: 1.1 %
HCT: 40.7 % (ref 35.0–45.0)
Hemoglobin: 13.8 g/dL (ref 11.7–15.5)
Lymphs Abs: 1815 cells/uL (ref 850–3900)
MCH: 30.7 pg (ref 27.0–33.0)
MCHC: 33.9 g/dL (ref 32.0–36.0)
MCV: 90.4 fL (ref 80.0–100.0)
MPV: 9.4 fL (ref 7.5–12.5)
Monocytes Relative: 4.5 %
Neutro Abs: 12255 cells/uL — ABNORMAL HIGH (ref 1500–7800)
Neutrophils Relative %: 81.7 %
Platelets: 246 10*3/uL (ref 140–400)
RBC: 4.5 10*6/uL (ref 3.80–5.10)
RDW: 15.1 % — ABNORMAL HIGH (ref 11.0–15.0)
Total Lymphocyte: 12.1 %
WBC: 15 10*3/uL — ABNORMAL HIGH (ref 3.8–10.8)

## 2022-11-19 NOTE — Progress Notes (Signed)
Hi Deborah Mccullough, there is a questionable area in your left breast so the imaging department will be contacting you soon to get you scheduled for either additional imaging or to get copies of your old mammogram reports for comparison.

## 2022-11-20 ENCOUNTER — Other Ambulatory Visit: Payer: Self-pay | Admitting: Family Medicine

## 2022-11-20 DIAGNOSIS — R928 Other abnormal and inconclusive findings on diagnostic imaging of breast: Secondary | ICD-10-CM

## 2022-11-20 NOTE — Progress Notes (Signed)
WBC count remains elevated-15.0.  absolute neutrophils remain elevated-stable.

## 2022-11-23 ENCOUNTER — Encounter: Payer: Self-pay | Admitting: Physician Assistant

## 2022-11-23 ENCOUNTER — Ambulatory Visit (INDEPENDENT_AMBULATORY_CARE_PROVIDER_SITE_OTHER): Payer: Medicare HMO

## 2022-11-23 ENCOUNTER — Other Ambulatory Visit: Payer: Self-pay

## 2022-11-23 ENCOUNTER — Ambulatory Visit: Payer: Medicare HMO | Attending: Physician Assistant | Admitting: Physician Assistant

## 2022-11-23 VITALS — BP 116/71 | HR 63 | Resp 16 | Ht 62.0 in | Wt 155.0 lb

## 2022-11-23 DIAGNOSIS — J449 Chronic obstructive pulmonary disease, unspecified: Secondary | ICD-10-CM | POA: Diagnosis not present

## 2022-11-23 DIAGNOSIS — M810 Age-related osteoporosis without current pathological fracture: Secondary | ICD-10-CM

## 2022-11-23 DIAGNOSIS — Z79899 Other long term (current) drug therapy: Secondary | ICD-10-CM

## 2022-11-23 DIAGNOSIS — M353 Polymyalgia rheumatica: Secondary | ICD-10-CM | POA: Diagnosis not present

## 2022-11-23 DIAGNOSIS — M19042 Primary osteoarthritis, left hand: Secondary | ICD-10-CM

## 2022-11-23 DIAGNOSIS — M19041 Primary osteoarthritis, right hand: Secondary | ICD-10-CM | POA: Diagnosis not present

## 2022-11-23 DIAGNOSIS — M25551 Pain in right hip: Secondary | ICD-10-CM

## 2022-11-23 DIAGNOSIS — M19032 Primary osteoarthritis, left wrist: Secondary | ICD-10-CM

## 2022-11-23 DIAGNOSIS — G8929 Other chronic pain: Secondary | ICD-10-CM

## 2022-11-23 DIAGNOSIS — R768 Other specified abnormal immunological findings in serum: Secondary | ICD-10-CM | POA: Diagnosis not present

## 2022-11-23 DIAGNOSIS — I10 Essential (primary) hypertension: Secondary | ICD-10-CM

## 2022-11-23 DIAGNOSIS — M25512 Pain in left shoulder: Secondary | ICD-10-CM

## 2022-11-23 DIAGNOSIS — E7800 Pure hypercholesterolemia, unspecified: Secondary | ICD-10-CM

## 2022-11-23 DIAGNOSIS — R5383 Other fatigue: Secondary | ICD-10-CM

## 2022-11-23 DIAGNOSIS — M1712 Unilateral primary osteoarthritis, left knee: Secondary | ICD-10-CM | POA: Diagnosis not present

## 2022-11-23 DIAGNOSIS — Z8669 Personal history of other diseases of the nervous system and sense organs: Secondary | ICD-10-CM

## 2022-11-23 DIAGNOSIS — M25511 Pain in right shoulder: Secondary | ICD-10-CM | POA: Diagnosis not present

## 2022-11-23 DIAGNOSIS — M25552 Pain in left hip: Secondary | ICD-10-CM

## 2022-11-23 DIAGNOSIS — F5101 Primary insomnia: Secondary | ICD-10-CM

## 2022-11-23 DIAGNOSIS — R7982 Elevated C-reactive protein (CRP): Secondary | ICD-10-CM

## 2022-11-23 DIAGNOSIS — M19012 Primary osteoarthritis, left shoulder: Secondary | ICD-10-CM

## 2022-11-23 DIAGNOSIS — F32A Depression, unspecified: Secondary | ICD-10-CM

## 2022-11-23 DIAGNOSIS — F419 Anxiety disorder, unspecified: Secondary | ICD-10-CM

## 2022-11-23 NOTE — Patient Instructions (Signed)
Standing Labs We placed an order today for your standing lab work.   Please have your standing labs drawn in 2 weeks x2, 2 months and then every 3 months.   Please have your labs drawn 2 weeks prior to your appointment so that the provider can discuss your lab results at your appointment.  Please note that you may see your imaging and lab results in [facility] before we have reviewed them. We will contact you once all results are reviewed. Please allow our office up to 72 hours to thoroughly review all of the results before contacting the office for clarification of your results.  Lab hours are:   Monday through Thursday from 8:00 am -12:30 pm and 1:00 pm-5:00 pm and Friday from 8:00 am-12:00 pm.  Please be advised, all patients with office appointments requiring lab work will take precedent over walk-in lab work.   Labs are drawn by [facility]. Please bring your co-pay at the time of your lab draw.  You may receive a bill from [facility] for your lab work.  Please note if you are on Hydroxychloroquine and and an order has been placed for a Hydroxychloroquine level, you will need to have it drawn 4 hours or more after your last dose.  If you wish to have your labs drawn at another location, please call the office 24 hours in advance so we can fax the orders.  The office is located at [address] [place], [place], [place] [address] No appointment is necessary.    If you have any questions regarding directions or hours of operation,  please call [phone].   As a reminder, please drink plenty of water prior to coming for your lab work. Thanks!    Methotrexate Tablets What is this medication? METHOTREXATE (METH oh TREX ate) treats inflammatory conditions such as arthritis and psoriasis. It works by decreasing inflammation, which can reduce pain and prevent long-term injury to the joints and skin. It may also be used to treat some types of cancer. It works by slowing down the growth of  cancer cells. This medicine may be used for other purposes; ask your health care provider or pharmacist if you have questions. COMMON BRAND NAME(S): Rheumatrex, Trexall What should I tell my care team before I take this medication? They need to know if you have any of these conditions: Fluid in the stomach area or lungs If you often drink alcohol Infection or immune system problems Kidney disease or on hemodialysis Liver disease Low blood counts, like low white cell, platelet, or red cell counts Lung disease Radiation therapy Stomach ulcers Ulcerative colitis An unusual or allergic reaction to methotrexate, other medications, foods, dyes, or preservatives Pregnant or trying to get pregnant Breast-feeding How should I use this medication? Take this medication by mouth with a glass of water. Follow the directions on the prescription label. Take your medication at regular intervals. Do not take it more often than directed. Do not stop taking except on your care team's advice. Make sure you know why you are taking this medication and how often you should take it. If this medication is used for a condition that is not cancer, like arthritis or psoriasis, it should be taken weekly, NOT daily. Taking this medication more often than directed can cause serious side effects, even death. Talk to your care team about safe handling and disposal of this medication. You may need to take special precautions. Talk to your care team about the use of this medication  in children. While this medication may be prescribed for selected conditions, precautions do apply. Overdosage: If you think you have taken too much of this medicine contact a poison control center or emergency room at once. NOTE: This medicine is only for you. Do not share this medicine with others. What if I miss a dose? If you miss a dose, talk with your care team. Do not take double or extra doses. What may interact with this medication? Do  not take this medication with any of the following: Acitretin This medication may also interact with the following: Aspirin and aspirin-like medications including salicylates Azathioprine Certain antibiotics like penicillins, tetracycline, and chloramphenicol Certain medications that treat or prevent blood clots like warfarin, apixaban, dabigatran, and rivaroxaban Certain medications for stomach problems like esomeprazole, omeprazole, pantoprazole Cyclosporine Dapsone Diuretics Gold Hydroxychloroquine Live virus vaccines Medications for infection like acyclovir, adefovir, amphotericin B, bacitracin, cidofovir, foscarnet, ganciclovir, gentamicin, pentamidine, vancomycin Mercaptopurine NSAIDs, medications for pain and inflammation, like ibuprofen or naproxen Other cytotoxic agents Pamidronate Pemetrexed Penicillamine Phenylbutazone Phenytoin Probenecid Pyrimethamine Retinoids such as isotretinoin and tretinoin Steroid medications like prednisone or cortisone Sulfonamides like sulfasalazine and trimethoprim/sulfamethoxazole Theophylline Zoledronic acid This list may not describe all possible interactions. Give your health care provider a list of all the medicines, herbs, non-prescription drugs, or dietary supplements you use. Also tell them if you smoke, drink alcohol, or use illegal drugs. Some items may interact with your medicine. What should I watch for while using this medication? Avoid alcoholic drinks. This medication can make you more sensitive to the sun. Keep out of the sun. If you cannot avoid being in the sun, wear protective clothing and use sunscreen. Do not use sun lamps or tanning beds/booths. You may need blood work done while you are taking this medication. Call your care team for advice if you get a fever, chills or sore throat, or other symptoms of a cold or flu. Do not treat yourself. This medication decreases your body's ability to fight infections. Try to avoid  being around people who are sick. This medication may increase your risk to bruise or bleed. Call your care team if you notice any unusual bleeding. Be careful brushing or flossing your teeth or using a toothpick because you may get an infection or bleed more easily. If you have any dental work done, tell your dentist you are receiving this medication. Check with your care team if you get an attack of severe diarrhea, nausea and vomiting, or if you sweat a lot. The loss of too much body fluid can make it dangerous for you to take this medication. Talk to your care team about your risk of cancer. You may be more at risk for certain types of cancers if you take this medication. Do not become pregnant while taking this medication or for 6 months after stopping it. Women should inform their care team if they wish to become pregnant or think they might be pregnant. Men should not father a child while taking this medication and for 3 months after stopping it. There is potential for serious harm to an unborn child. Talk to your care team for more information. Do not breast-feed an infant while taking this medication or for 1 week after stopping it. This medication may make it more difficult to get pregnant or father a child. Talk to your care team if you are concerned about your fertility. What side effects may I notice from receiving this medication? Side effects that you should report  to your care team as soon as possible: Allergic reactions--skin rash, itching, hives, swelling of the face, lips, tongue, or throat Blood clot--pain, swelling, or warmth in the leg, shortness of breath, chest pain Dry cough, shortness of breath or trouble breathing Infection--fever, chills, cough, sore throat, wounds that don't heal, pain or trouble when passing urine, general feeling of discomfort or being unwell Kidney injury--decrease in the amount of urine, swelling of the ankles, hands, or feet Liver injury--right upper  belly pain, loss of appetite, nausea, light-colored stool, dark yellow or brown urine, yellowing of the skin or eyes, unusual weakness or fatigue Low red blood cell count--unusual weakness or fatigue, dizziness, headache, trouble breathing Redness, blistering, peeling, or loosening of the skin, including inside the mouth Seizures Unusual bruising or bleeding Side effects that usually do not require medical attention (report to your care team if they continue or are bothersome): Diarrhea Dizziness Hair loss Nausea Pain, redness, or swelling with sores inside the mouth or throat Vomiting This list may not describe all possible side effects. Call your doctor for medical advice about side effects. You may report side effects to FDA at 1-800-FDA-1088. Where should I keep my medication? Keep out of the reach of children and pets. Store at room temperature between 20 and 25 degrees C (68 and 77 degrees F). Protect from light. Get rid of any unused medication after the expiration date. Talk to your care team about how to dispose of unused medication. Special directions may apply. NOTE: This sheet is a summary. It may not cover all possible information. If you have questions about this medicine, talk to your doctor, pharmacist, or health care provider.  2023 Elsevier/Gold Standard (2021-02-06 00:00:00)

## 2022-11-23 NOTE — Telephone Encounter (Signed)
Pending labs and chest x-ray, patient will be starting methotrexate tablets and folic acid per Hazel Sams, PA-C. Consent was obtained and sent to the scan center.

## 2022-11-26 NOTE — Progress Notes (Signed)
No signs of pulmonary edema or focal pulmonary consolidation.    Increase in AP diameter of chest suggests COPD. Small linear densities in left lower lung field suggest minimal scarring or minimal subsegmental atelectasis.

## 2022-11-26 NOTE — Progress Notes (Signed)
CMP WNL. Hepatitis B and C negative.  Immunoglobulins WNL.  TB gold negative.

## 2022-11-27 MED ORDER — FOLIC ACID 1 MG PO TABS: 2.0000 mg | ORAL_TABLET | Freq: Every day | ORAL | 3 refills | Status: DC

## 2022-11-27 MED ORDER — METHOTREXATE SODIUM 2.5 MG PO TABS: ORAL_TABLET | ORAL | 0 refills | Status: DC

## 2022-11-27 NOTE — Progress Notes (Signed)
Ok to initiate MTX.  Ok to place referral to [facility].  She can also start by following up with her PCP first if she would like to since she is asymptomatic.

## 2022-11-27 NOTE — Telephone Encounter (Signed)
She will start on methotrexate 6 tablets by mouth once weekly x 2 weeks and if labs are stable she will increase to 8 tablets once weekly. She will also take folic acid 2 mg daily.

## 2022-11-28 ENCOUNTER — Other Ambulatory Visit: Payer: Self-pay | Admitting: Physician Assistant

## 2022-11-28 ENCOUNTER — Ambulatory Visit
Admission: RE | Admit: 2022-11-28 | Discharge: 2022-11-28 | Disposition: A | Discharge status: Home / Self Care | Payer: Medicare HMO | Source: Ambulatory Visit | Attending: Family Medicine | Admitting: Family Medicine

## 2022-11-28 DIAGNOSIS — N6321 Unspecified lump in the left breast, upper outer quadrant: Secondary | ICD-10-CM | POA: Diagnosis not present

## 2022-11-28 DIAGNOSIS — R928 Other abnormal and inconclusive findings on diagnostic imaging of breast: Secondary | ICD-10-CM

## 2022-11-28 DIAGNOSIS — N6323 Unspecified lump in the left breast, lower outer quadrant: Secondary | ICD-10-CM | POA: Diagnosis not present

## 2022-11-28 LAB — COMPLETE METABOLIC PANEL WITH GFR
AG Ratio: 1.8 (calc) (ref 1.0–2.5)
ALT: 19 U/L (ref 6–29)
AST: 22 U/L (ref 10–35)
Albumin: 4.1 g/dL (ref 3.6–5.1)
Alkaline phosphatase (APISO): 48 U/L (ref 37–153)
BUN: 22 mg/dL (ref 7–25)
CO2: 24 mmol/L (ref 20–32)
Calcium: 9.3 mg/dL (ref 8.6–10.4)
Chloride: 101 mmol/L (ref 98–110)
Creat: 0.92 mg/dL (ref 0.60–1.00)
Globulin: 2.3 g/dL (calc) (ref 1.9–3.7)
Glucose, Bld: 88 mg/dL (ref 65–99)
Potassium: 4.4 mmol/L (ref 3.5–5.3)
Sodium: 137 mmol/L (ref 135–146)
Total Bilirubin: 0.5 mg/dL (ref 0.2–1.2)
Total Protein: 6.4 g/dL (ref 6.1–8.1)
eGFR: 66 mL/min/{1.73_m2} (ref >=60)

## 2022-11-28 LAB — IGG, IGA, IGM
IgG (Immunoglobin G), Serum: 889 mg/dL (ref 600–1540)
IgM, Serum: 129 mg/dL (ref 50–300)
Immunoglobulin A: 139 mg/dL (ref 70–320)

## 2022-11-28 LAB — QUANTIFERON-TB GOLD PLUS
Mitogen-NIL: 4.5 IU/mL
NIL: 0.03 IU/mL
QuantiFERON-TB Gold Plus: NEGATIVE
TB1-NIL: 0.01 IU/mL
TB2-NIL: 0.01 IU/mL

## 2022-11-28 LAB — PROTEIN ELECTROPHORESIS, SERUM, WITH REFLEX
Albumin ELP: 4.1 g/dL (ref 3.8–4.8)
Alpha 1: 0.3 g/dL (ref 0.2–0.3)
Alpha 2: 0.7 g/dL (ref 0.5–0.9)
Beta 2: 0.4 g/dL (ref 0.2–0.5)
Beta Globulin: 0.4 g/dL (ref 0.4–0.6)
Gamma Globulin: 0.8 g/dL (ref 0.8–1.7)
Total Protein: 6.7 g/dL (ref 6.1–8.1)

## 2022-11-28 LAB — HEPATITIS C ANTIBODY: Hepatitis C Ab: NONREACTIVE

## 2022-11-28 LAB — HEPATITIS B CORE ANTIBODY, IGM: Hep B C IgM: NONREACTIVE

## 2022-11-28 LAB — HEPATITIS B SURFACE ANTIGEN: Hepatitis B Surface Ag: NONREACTIVE

## 2022-11-28 NOTE — Progress Notes (Signed)
SPEP did not reveal any abnormal protein bands.

## 2022-11-28 NOTE — Telephone Encounter (Signed)
Next Visit: 01/18/2023  Last Visit: 11/23/2022  Last Fill: 10/18/2022  Dx: Polymyalgia rheumatica   Current Dose per office note on 11/23/2022: She will remain on prednisone 9 mg daily until February at which time she will try reducing by 1 mg every month as tolerated.   Okay to refill Prednisone?

## 2022-12-05 ENCOUNTER — Telehealth: Payer: Self-pay | Admitting: Family Medicine

## 2022-12-05 DIAGNOSIS — F411 Generalized anxiety disorder: Secondary | ICD-10-CM

## 2022-12-05 NOTE — Telephone Encounter (Signed)
Patient is scheduled for 01/30/23 with Dr. Madilyn Fireman!!

## 2022-12-11 ENCOUNTER — Other Ambulatory Visit: Payer: Self-pay | Admitting: Physician Assistant

## 2022-12-13 ENCOUNTER — Other Ambulatory Visit: Payer: Self-pay | Admitting: *Deleted

## 2022-12-13 MED ORDER — PREDNISONE 1 MG PO TABS: 2.0000 mg | ORAL_TABLET | Freq: Every day | ORAL | 0 refills | Status: DC

## 2022-12-13 NOTE — Telephone Encounter (Signed)
Next Visit: 01/18/2023   Last Visit: 11/23/2022   Last Fill: 10/18/2022   Dx: Polymyalgia rheumatica    Current Dose per office note on 11/23/2022: She will remain on prednisone 9 mg daily until February at which time she will try reducing by 1 mg every month as tolerated.   Per patient she is taking Prednisone 8 mg daily.    Okay to refill Prednisone?

## 2022-12-14 ENCOUNTER — Other Ambulatory Visit: Payer: Self-pay | Admitting: Family Medicine

## 2022-12-25 ENCOUNTER — Other Ambulatory Visit: Payer: Self-pay | Admitting: Physician Assistant

## 2022-12-26 ENCOUNTER — Telehealth: Payer: Self-pay

## 2022-12-26 ENCOUNTER — Other Ambulatory Visit: Payer: Self-pay

## 2022-12-26 DIAGNOSIS — Z79899 Other long term (current) drug therapy: Secondary | ICD-10-CM

## 2022-12-26 NOTE — Telephone Encounter (Signed)
Patient called and requested that lab orders were faxed to PCP. I have faxed orders for CBC and CMP to Dr. Gardiner Ramus office.

## 2022-12-27 DIAGNOSIS — Z79899 Other long term (current) drug therapy: Secondary | ICD-10-CM | POA: Diagnosis not present

## 2022-12-28 LAB — COMPLETE METABOLIC PANEL WITH GFR
AG Ratio: 1.9 (calc) (ref 1.0–2.5)
ALT: 17 U/L (ref 6–29)
AST: 20 U/L (ref 10–35)
Albumin: 4.3 g/dL (ref 3.6–5.1)
Alkaline phosphatase (APISO): 52 U/L (ref 37–153)
BUN: 19 mg/dL (ref 7–25)
CO2: 25 mmol/L (ref 20–32)
Calcium: 9.3 mg/dL (ref 8.6–10.4)
Chloride: 103 mmol/L (ref 98–110)
Creat: 0.95 mg/dL (ref 0.60–1.00)
Globulin: 2.3 g/dL (calc) (ref 1.9–3.7)
Glucose, Bld: 73 mg/dL (ref 65–99)
Potassium: 4.2 mmol/L (ref 3.5–5.3)
Sodium: 139 mmol/L (ref 135–146)
Total Bilirubin: 0.6 mg/dL (ref 0.2–1.2)
Total Protein: 6.6 g/dL (ref 6.1–8.1)
eGFR: 63 mL/min/{1.73_m2} (ref >=60)

## 2022-12-28 LAB — CBC WITH DIFFERENTIAL/PLATELET
Absolute Monocytes: 442 cells/uL (ref 200–950)
Basophils Absolute: 60 cells/uL (ref 0–200)
Basophils Relative: 0.7 %
Eosinophils Absolute: 60 cells/uL (ref 15–500)
Eosinophils Relative: 0.7 %
HCT: 38.9 % (ref 35.0–45.0)
Hemoglobin: 13.4 g/dL (ref 11.7–15.5)
Lymphs Abs: 2304 cells/uL (ref 850–3900)
MCH: 32 pg (ref 27.0–33.0)
MCHC: 34.4 g/dL (ref 32.0–36.0)
MCV: 92.8 fL (ref 80.0–100.0)
MPV: 9.2 fL (ref 7.5–12.5)
Monocytes Relative: 5.2 %
Neutro Abs: 5636 cells/uL (ref 1500–7800)
Neutrophils Relative %: 66.3 %
Platelets: 235 10*3/uL (ref 140–400)
RBC: 4.19 10*6/uL (ref 3.80–5.10)
RDW: 13.4 % (ref 11.0–15.0)
Total Lymphocyte: 27.1 %
WBC: 8.5 10*3/uL (ref 3.8–10.8)

## 2022-12-28 NOTE — Progress Notes (Signed)
CBC and CMP WNL.

## 2023-01-01 ENCOUNTER — Other Ambulatory Visit: Payer: Self-pay | Admitting: Physician Assistant

## 2023-01-01 NOTE — Telephone Encounter (Signed)
Next Visit: 01/18/2023   Last Visit: 11/23/2022  Dx: Polymyalgia rheumatica    Current Dose per office note on 11/23/2022: She will remain on prednisone 9 mg daily until February at which time she will try reducing by 1 mg every month as tolerated.    Per patient she is taking Prednisone 8 mg daily.    Okay to refill Prednisone?

## 2023-01-02 ENCOUNTER — Other Ambulatory Visit: Payer: Self-pay | Admitting: Family Medicine

## 2023-01-02 ENCOUNTER — Other Ambulatory Visit: Payer: Self-pay | Admitting: *Deleted

## 2023-01-02 DIAGNOSIS — N63 Unspecified lump in unspecified breast: Secondary | ICD-10-CM

## 2023-01-02 MED ORDER — PREDNISONE 5 MG PO TABS: 5.0000 mg | ORAL_TABLET | Freq: Every day | ORAL | 0 refills | Status: DC

## 2023-01-02 NOTE — Telephone Encounter (Signed)
Refill request received via fax from [facility] for Prednisone.  Next Visit: 01/18/2023   Last Visit: 11/23/2022  Last Fill: 11/28/2022  Dx: Polymyalgia rheumatica    Current Dose per office note on 11/23/2022:  She will remain on prednisone 9 mg daily until February at which time she will try reducing by 1 mg every month as tolerated.    Per patient she is taking Prednisone 8 mg daily.   Okay to refill Prednisone?
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
## 2023-01-07 ENCOUNTER — Other Ambulatory Visit: Payer: Self-pay | Admitting: Family Medicine

## 2023-01-09 ENCOUNTER — Other Ambulatory Visit: Payer: Self-pay | Admitting: *Deleted

## 2023-01-09 DIAGNOSIS — N63 Unspecified lump in unspecified breast: Secondary | ICD-10-CM

## 2023-01-09 DIAGNOSIS — Z79899 Other long term (current) drug therapy: Secondary | ICD-10-CM | POA: Diagnosis not present

## 2023-01-10 ENCOUNTER — Other Ambulatory Visit: Payer: Self-pay

## 2023-01-10 LAB — COMPLETE METABOLIC PANEL WITH GFR
AG Ratio: 2 (calc) (ref 1.0–2.5)
ALT: 21 U/L (ref 6–29)
AST: 22 U/L (ref 10–35)
Albumin: 4.3 g/dL (ref 3.6–5.1)
Alkaline phosphatase (APISO): 46 U/L (ref 37–153)
BUN: 19 mg/dL (ref 7–25)
CO2: 29 mmol/L (ref 20–32)
Calcium: 9.1 mg/dL (ref 8.6–10.4)
Chloride: 101 mmol/L (ref 98–110)
Creat: 0.9 mg/dL (ref 0.60–1.00)
Globulin: 2.1 g/dL (calc) (ref 1.9–3.7)
Glucose, Bld: 82 mg/dL (ref 65–99)
Potassium: 4.3 mmol/L (ref 3.5–5.3)
Sodium: 138 mmol/L (ref 135–146)
Total Bilirubin: 0.6 mg/dL (ref 0.2–1.2)
Total Protein: 6.4 g/dL (ref 6.1–8.1)
eGFR: 68 mL/min/{1.73_m2} (ref >=60)

## 2023-01-10 LAB — CBC WITH DIFFERENTIAL/PLATELET
Absolute Monocytes: 350 cells/uL (ref 200–950)
Basophils Absolute: 59 cells/uL (ref 0–200)
Basophils Relative: 0.9 %
Eosinophils Absolute: 40 cells/uL (ref 15–500)
Eosinophils Relative: 0.6 %
HCT: 38 % (ref 35.0–45.0)
Hemoglobin: 12.8 g/dL (ref 11.7–15.5)
Lymphs Abs: 1597 cells/uL (ref 850–3900)
MCH: 31.3 pg (ref 27.0–33.0)
MCHC: 33.7 g/dL (ref 32.0–36.0)
MCV: 92.9 fL (ref 80.0–100.0)
MPV: 8.8 fL (ref 7.5–12.5)
Monocytes Relative: 5.3 %
Neutro Abs: 4554 cells/uL (ref 1500–7800)
Neutrophils Relative %: 69 %
Platelets: 203 10*3/uL (ref 140–400)
RBC: 4.09 10*6/uL (ref 3.80–5.10)
RDW: 13.6 % (ref 11.0–15.0)
Total Lymphocyte: 24.2 %
WBC: 6.6 10*3/uL (ref 3.8–10.8)

## 2023-01-10 MED ORDER — METHOTREXATE SODIUM 2.5 MG PO TABS: ORAL_TABLET | ORAL | 0 refills | Status: DC

## 2023-01-10 NOTE — Telephone Encounter (Signed)
Patient requested a refill of methotrexate 8 tablets once weekly.  Next Visit: 01/18/2023  Last Visit: 11/23/2022  Last Fill: 11/27/2022  DX: Polymyalgia rheumatica   Current Dose per office note on 11/23/2022: methotrexate 6 tablets by mouth once weekly x 2 weeks and if labs are stable she will increase to 8 tablets once weekly.   Labs: 01/09/2023 CBC and CMP WNL   Patient states she completed the two weeks of 8 tabs weekly and had labs drawn on 01/09/2023.  Okay to refill methotrexate?

## 2023-01-10 NOTE — Progress Notes (Signed)
CBC and CMP WNL.

## 2023-01-18 ENCOUNTER — Encounter: Payer: Self-pay | Admitting: Physician Assistant

## 2023-01-18 ENCOUNTER — Ambulatory Visit: Payer: Medicare HMO | Attending: Physician Assistant | Admitting: Physician Assistant

## 2023-01-18 VITALS — BP 119/72 | HR 62 | Ht 62.0 in | Wt 161.0 lb

## 2023-01-18 DIAGNOSIS — M25552 Pain in left hip: Secondary | ICD-10-CM

## 2023-01-18 DIAGNOSIS — R7982 Elevated C-reactive protein (CRP): Secondary | ICD-10-CM

## 2023-01-18 DIAGNOSIS — Z8669 Personal history of other diseases of the nervous system and sense organs: Secondary | ICD-10-CM

## 2023-01-18 DIAGNOSIS — R5383 Other fatigue: Secondary | ICD-10-CM | POA: Diagnosis not present

## 2023-01-18 DIAGNOSIS — M19041 Primary osteoarthritis, right hand: Secondary | ICD-10-CM | POA: Diagnosis not present

## 2023-01-18 DIAGNOSIS — M1712 Unilateral primary osteoarthritis, left knee: Secondary | ICD-10-CM | POA: Diagnosis not present

## 2023-01-18 DIAGNOSIS — M19032 Primary osteoarthritis, left wrist: Secondary | ICD-10-CM | POA: Diagnosis not present

## 2023-01-18 DIAGNOSIS — M19012 Primary osteoarthritis, left shoulder: Secondary | ICD-10-CM

## 2023-01-18 DIAGNOSIS — I10 Essential (primary) hypertension: Secondary | ICD-10-CM

## 2023-01-18 DIAGNOSIS — Z79899 Other long term (current) drug therapy: Secondary | ICD-10-CM

## 2023-01-18 DIAGNOSIS — M25511 Pain in right shoulder: Secondary | ICD-10-CM | POA: Diagnosis not present

## 2023-01-18 DIAGNOSIS — M19042 Primary osteoarthritis, left hand: Secondary | ICD-10-CM

## 2023-01-18 DIAGNOSIS — M353 Polymyalgia rheumatica: Secondary | ICD-10-CM | POA: Diagnosis not present

## 2023-01-18 DIAGNOSIS — M25551 Pain in right hip: Secondary | ICD-10-CM | POA: Diagnosis not present

## 2023-01-18 DIAGNOSIS — M25512 Pain in left shoulder: Secondary | ICD-10-CM

## 2023-01-18 DIAGNOSIS — R768 Other specified abnormal immunological findings in serum: Secondary | ICD-10-CM | POA: Diagnosis not present

## 2023-01-18 DIAGNOSIS — F5101 Primary insomnia: Secondary | ICD-10-CM

## 2023-01-18 DIAGNOSIS — M810 Age-related osteoporosis without current pathological fracture: Secondary | ICD-10-CM

## 2023-01-18 DIAGNOSIS — F419 Anxiety disorder, unspecified: Secondary | ICD-10-CM

## 2023-01-18 DIAGNOSIS — G8929 Other chronic pain: Secondary | ICD-10-CM

## 2023-01-18 DIAGNOSIS — F32A Depression, unspecified: Secondary | ICD-10-CM

## 2023-01-18 DIAGNOSIS — E7800 Pure hypercholesterolemia, unspecified: Secondary | ICD-10-CM

## 2023-01-18 NOTE — Patient Instructions (Addendum)
Standing Labs We placed an order today for your standing lab work.   Please have your standing labs drawn at end of  March and then every 3 months   Please have your labs drawn 2 weeks prior to your appointment so that the provider can discuss your lab results at your appointment.  Please note that you may see your imaging and lab results in [place] before we have reviewed them. We will contact you once all results are reviewed. Please allow our office up to 72 hours to thoroughly review all of the results before contacting the office for clarification of your results.  Lab hours are:   Monday through Thursday from 8:00 am -12:30 pm and 1:00 pm-5:00 pm and Friday from 8:00 am-12:00 pm.  Please be advised, all patients with office appointments requiring lab work will take precedent over walk-in lab work.   Labs are drawn by [facility]. Please bring your co-pay at the time of your lab draw.  You may receive a bill from [facility] for your lab work.  Please note if you are on Hydroxychloroquine and and an order has been placed for a Hydroxychloroquine level, you will need to have it drawn 4 hours or more after your last dose.  If you wish to have your labs drawn at another location, please call the office 24 hours in advance so we can fax the orders.  The office is located at [address] [place], [place], [place] [address] No appointment is necessary.    If you have any questions regarding directions or hours of operation,  please call [phone].   As a reminder, please drink plenty of water prior to coming for your lab work. Thanks!    Sarilumab Injection What is this medication? SARILUMAB (sar IL ue mab) treats autoimmune conditions, such as arthritis. It works by slowing down an overactive immune system. It is a monoclonal antibody. This medicine may be used for other purposes; ask your health care provider or pharmacist if you have questions. COMMON BRAND NAME(S): KEVZARA What  should I tell my care team before I take this medication? They need to know if you have any of these conditions: Cancer Diabetes Diverticulitis Hepatitis B or history of hepatitis B infection High cholesterol Immune system problems Infection, especially a viral infection, such as chickenpox, cold sores, herpes Liver disease Low blood cell levels (white cells, platelets, or red blood cells) Recent or upcoming vaccine Scheduled to have surgery Stomach or intestine problems Tuberculosis, a positive skin test for tuberculosis, or recent close contact with someone who has tuberculosis An unusual or allergic reaction to sarilumab, other medications, foods, dyes, or preservatives Pregnant or trying to get pregnant Breast-feeding How should I use this medication? This medication is injected under the skin. You will be taught how to prepare and give it. Take it as directed on the prescription label at the same time every day. Keep taking it unless your care team tells you to stop. It is important that you put your used needles and syringes in a special sharps container. Do not put them in a trash can. If you do not have a sharps container, call your pharmacist or care team to get one. A special MedGuide will be given to you by the pharmacist with each prescription and refill. Be sure to read this information carefully each time. Talk to your care team about the use of this medication in children. Special care may be needed. Overdosage: If you think you  have taken too much of this medicine contact a poison control center or emergency room at once. NOTE: This medicine is only for you. Do not share this medicine with others. What if I miss a dose? If you miss a dose, take it as soon as you can. If it is almost time for your next dose, take only that dose. Do not take double or extra doses. What may interact with this medication? Do not take this medication with any of the following: Live virus  vaccines This medication may also interact with the following: Biologic medications, such as abatacept, adalimumab, anakinra, certolizumab, etanercept, golimumab, infliximab, ofatumumab, rituximab, secukinumab, tocilizumab, tofacitinib, ustekinumab This medication may affect how other medications work. Talk with your care team about all the medications you take. They may suggest changes to your treatment plan to lower the risk of side effects and to make sure your medications work as intended. This list may not describe all possible interactions. Give your health care provider a list of all the medicines, herbs, non-prescription drugs, or dietary supplements you use. Also tell them if you smoke, drink alcohol, or use illegal drugs. Some items may interact with your medicine. What should I watch for while using this medication? Visit your care team for regular checks on your progress. Tell your care team if your symptoms do not start to get better or if they get worse. You may need blood work done while you are taking this medication. This medication can increase your risk of getting an infection. Call your care team for advice if you get a fever, chills, sore throat, or other symptoms of a cold or flu. Do not treat yourself. Try to avoid being around people who are sick. If you have not had the measles or chickenpox vaccines, tell your care team right away if you are around someone with these viruses. If you are going to need surgery or other procedure, tell your care team that you are using this medication. Talk to your care team about your risk of cancer. You may be at risk for certain types of cancers if you take this medication. What side effects may I notice from receiving this medication? Side effects that you should report to your care team as soon as possible: Allergic reactions--skin rash, itching, hives, swelling of the face, lips, tongue, or throat Infection--fever, chills, cough, sore  throat, wounds that don't heal, pain or trouble when passing urine, general feeling of discomfort or being unwell Stomach pain that is severe, does not go away, or gets worse Unusual bruising or bleeding Side effects that usually do not require medical attention (report to your care team if they continue or are bothersome): Pain, redness, or irritation at injection site Runny or stuffy nose Sore throat This list may not describe all possible side effects. Call your doctor for medical advice about side effects. You may report side effects to FDA at 1-800-FDA-1088. Where should I keep my medication? Keep out of the reach of children and pets. Store in a refrigerator or at room temperature between 20 and 25 degrees C (68 and 77 degrees F). Refrigeration (preferred): Store in the refrigerator. Do not freeze. Protect from light. Keep in the original container until you are ready to take it. Get rid of any unused medication after the expiration date. Room temperature: This medication may be stored at room temperature for up to 14 days. Protect from light. Keep it in the original carton until you are ready to take  it. Get rid of any unused medication after 14 days or after it expires, whichever is first. To get rid of medications that are no longer needed or have expired: Take the medication to a medication take-back program. Ask your pharmacy or law enforcement to find a location. If you cannot return the medication, ask your pharmacist or care team how to get rid of this medication safely. NOTE: This sheet is a summary. It may not cover all possible information. If you have questions about this medicine, talk to your doctor, pharmacist, or health care provider.  2023 Elsevier/Gold Standard (2022-02-15 00:00:00)

## 2023-01-28 ENCOUNTER — Other Ambulatory Visit: Payer: Self-pay | Admitting: Physician Assistant

## 2023-01-28 NOTE — Telephone Encounter (Signed)
Next Visit: 03/20/2023  Last Visit: 01/18/2023  Last Fill: 01/01/2023  Dx: Polymyalgia rheumatica   Current Dose per office note on 01/18/2023: prednisone 8 mg daily   Okay to refill Prednisone?

## 2023-01-30 ENCOUNTER — Ambulatory Visit (INDEPENDENT_AMBULATORY_CARE_PROVIDER_SITE_OTHER): Payer: Medicare HMO | Admitting: Family Medicine

## 2023-01-30 ENCOUNTER — Encounter: Payer: Self-pay | Admitting: Family Medicine

## 2023-01-30 VITALS — BP 128/82 | HR 67 | Ht 62.0 in | Wt 161.0 lb

## 2023-01-30 DIAGNOSIS — H6123 Impacted cerumen, bilateral: Secondary | ICD-10-CM | POA: Diagnosis not present

## 2023-01-30 DIAGNOSIS — I10 Essential (primary) hypertension: Secondary | ICD-10-CM | POA: Diagnosis not present

## 2023-01-30 DIAGNOSIS — R9389 Abnormal findings on diagnostic imaging of other specified body structures: Secondary | ICD-10-CM | POA: Diagnosis not present

## 2023-01-30 NOTE — Assessment & Plan Note (Signed)
Well controlled. Continue current regimen. Follow up in  6 mo  

## 2023-01-30 NOTE — Progress Notes (Signed)
`  Established Patient Office Visit  Subjective   Patient ID: Deborah Mccullough, female    DOB: March 28, 1951  Age: 72 y.o. MRN: [id number]  Chief Complaint  Patient presents with   Hypertension   Cerumen Impaction    HPI 3 mo f/u  Hypertension- Pt denies chest pain, SOB, dizziness, or heart palpitations.  Taking meds as directed w/o problems.  Denies medication side effects.    Last time I saw her in the fall she had a rash which was felt to be due to Plaquenil.  She was switched to methotrexate she has been doing okay on it but feels that she is been having some side effects.  She did have recent labs including CBC and CMP.  She also wanted to let me know that she was moving some stuff in the basement and felt a sharp pain in her right side just below her breast tissue..  She says it is better now.  Also had a chest x-ray that was performed before starting the methotrexate.  Told to go over the results with me.  Chest x-ray showed COPD but no focal pulmonary consolidation and some small linear densities in the left lower lung suggesting possible scarring versus subsegmental atelectasis.    ROS    Objective:     BP 128/82   Pulse 67   Ht 5' 2"$  (1.575 m)   Wt 161 lb (73 kg)   SpO2 96%   BMI 29.45 kg/m    Physical Exam Vitals and nursing note reviewed.  Constitutional:      Appearance: She is well-developed.  HENT:     Head: Normocephalic and atraumatic.  Cardiovascular:     Rate and Rhythm: Normal rate and regular rhythm.     Heart sounds: Normal heart sounds.  Pulmonary:     Effort: Pulmonary effort is normal.     Breath sounds: Normal breath sounds.  Skin:    General: Skin is warm and dry.  Neurological:     Mental Status: She is alert and oriented to person, place, and time.  Psychiatric:        Behavior: Behavior normal.      No results found for any visits on 01/30/23.    The 10-year ASCVD risk score (Arnett DK, et al., 2019) is: 16.7%     Assessment & Plan:   Problem List Items Addressed This Visit       Cardiovascular and Mediastinum   Essential hypertension, benign - Primary    Well controlled. Continue current regimen. Follow up in  17mo      Other Visit Diagnoses     Bilateral impacted cerumen       Abnormal chest x-ray          Abnormal chest x-ray-it did show COPD.  She is a former smoker.  She denies any shortness of breath with activities.  We did discuss getting spirometry for further evaluation to check lung function.  And also working on regular exercise to improve deep breathing.  Bilateral cerumen impaction-irrigation performed today.  Patient tolerated well.   Indication: Cerumen impaction of the ear(s) Medical necessity statement: On physical examination, cerumen impairs clinically significant portions of the external auditory canal, and tympanic membrane. Noted obstructive, copious cerumen that cannot be removed without magnification and instrumentation Consent: Discussed benefits and risks of procedure and verbal consent obtained Procedure: Patient was prepped for the procedure. Utilized an otoscope to assess and take note of the ear canal, the  tympanic membrane, and the presence, amount, and placement of the cerumen. Gentle water irrigation and soft plastic curette was utilized to remove cerumen.  Post procedure examination: shows cerumen was completely removed. Patient tolerated procedure well. The patient is made aware that they may experience temporary vertigo, temporary hearing loss, and temporary discomfort. If these symptom last for more than 24 hours to call the clinic or proceed to the ED.   Return in about 4 weeks (around 02/27/2023) for spirometry. Beatrice Lecher, MD

## 2023-02-20 DIAGNOSIS — H5213 Myopia, bilateral: Secondary | ICD-10-CM | POA: Diagnosis not present

## 2023-02-20 DIAGNOSIS — H25813 Combined forms of age-related cataract, bilateral: Secondary | ICD-10-CM | POA: Diagnosis not present

## 2023-02-20 DIAGNOSIS — H524 Presbyopia: Secondary | ICD-10-CM | POA: Diagnosis not present

## 2023-02-20 DIAGNOSIS — H40013 Open angle with borderline findings, low risk, bilateral: Secondary | ICD-10-CM | POA: Diagnosis not present

## 2023-02-27 ENCOUNTER — Encounter: Payer: Self-pay | Admitting: Family Medicine

## 2023-02-27 ENCOUNTER — Ambulatory Visit (INDEPENDENT_AMBULATORY_CARE_PROVIDER_SITE_OTHER): Payer: Medicare HMO | Admitting: Family Medicine

## 2023-02-27 VITALS — BP 110/53 | HR 64 | Ht 62.0 in | Wt 162.0 lb

## 2023-02-27 DIAGNOSIS — J449 Chronic obstructive pulmonary disease, unspecified: Secondary | ICD-10-CM | POA: Diagnosis not present

## 2023-02-27 DIAGNOSIS — R9389 Abnormal findings on diagnostic imaging of other specified body structures: Secondary | ICD-10-CM | POA: Diagnosis not present

## 2023-02-27 HISTORY — DX: Chronic obstructive pulmonary disease, unspecified: J44.9

## 2023-02-27 MED ORDER — ALBUTEROL SULFATE (2.5 MG/3ML) 0.083% IN NEBU
2.5000 mg | INHALATION_SOLUTION | Freq: Once | RESPIRATORY_TRACT | Status: AC
  Administered 2023-02-27: 2.5 mg via RESPIRATORY_TRACT

## 2023-02-27 NOTE — Progress Notes (Signed)
[facility]  Established Patient Office Visit  Subjective   Patient ID: Deborah Mccullough, female    DOB: 12-16-1951  Age: 72 y.o. MRN: [id number]  Chief Complaint  Patient presents with   spirometry    HPI  Deborah Mccullough is a 72 year old female who is here today for spirometry.  She had a chest x-ray performed in December which showed evidence of COPD as well as some linear densities in the left lower lung suggestive of scar tissue and atelectasis.  She denies any shortness of breath.    ROS    Objective:     BP (!) 110/53   Pulse 64   Ht '5\' 2"'$  (1.575 m)   Wt 162 lb (73.5 kg)   SpO2 95%   BMI 29.63 kg/m    Physical Exam Vitals reviewed.  Constitutional:      Appearance: She is well-developed.  HENT:     Head: Normocephalic and atraumatic.  Eyes:     Conjunctiva/sclera: Conjunctivae normal.  Cardiovascular:     Rate and Rhythm: Normal rate.  Pulmonary:     Effort: Pulmonary effort is normal.  Skin:    General: Skin is dry.     Coloration: Skin is not pale.  Neurological:     Mental Status: She is alert and oriented to person, place, and time.  Psychiatric:        Behavior: Behavior normal.      No results found for any visits on 02/27/23.    The 10-year ASCVD risk score (Arnett DK, et al., 2019) is: 12.6%    Assessment & Plan:   Problem List Items Addressed This Visit       Respiratory   Stage 1 mild COPD by GOLD classification ([place])    Spirometry shows FVC 90%, FEV1 72% % with ratio of   59.5 she had a pretty flat curve on her initial spirometry prealbuterol.  Noted to have a 19% increase in FEV1 after albuterol but again her technique was much better post albuterol.  So I am not willing to make a diagnosis of asthma at this point. Technique was fair with initial spirometry. Improved after albuterol.  Discussed results with her and her husband. She is currently asymptomatic.   Repeat spiro in one year.   Can use albuterol PRN.   2nd had smoke exposure as a  child. Never smoker       Other Visit Diagnoses     Abnormal chest x-ray    -  Primary   Relevant Medications   albuterol (PROVENTIL) (2.5 MG/3ML) 0.083% nebulizer solution 2.5 mg (Completed)      I spent 20 minutes on the day of the encounter to include pre-visit record review, face-to-face time with the patient and post visit ordering of test. This doesn't include the time for the actual spirometry testing.    No follow-ups on file.    Beatrice Lecher, MD

## 2023-02-27 NOTE — Assessment & Plan Note (Addendum)
Spirometry shows FVC 90%, FEV1 72% % with ratio of   59.5 she had a pretty flat curve on her initial spirometry prealbuterol.  Noted to have a 19% increase in FEV1 after albuterol but again her technique was much better post albuterol.  So I am not willing to make a diagnosis of asthma at this point. Technique was fair with initial spirometry. Improved after albuterol.  Discussed results with her and her husband. She is currently asymptomatic.   Repeat spiro in one year.   Can use albuterol PRN.   2nd had smoke exposure as a child. Never smoker

## 2023-03-02 ENCOUNTER — Other Ambulatory Visit: Payer: Self-pay | Admitting: Physician Assistant

## 2023-03-04 ENCOUNTER — Other Ambulatory Visit: Payer: Self-pay | Admitting: Family Medicine

## 2023-03-04 DIAGNOSIS — F411 Generalized anxiety disorder: Secondary | ICD-10-CM

## 2023-03-04 NOTE — Telephone Encounter (Signed)
The plan at her last appointment on 01/18/23 was to try tapering by 1 mg every month.

## 2023-03-04 NOTE — Telephone Encounter (Signed)
Next Visit: 03/20/2023   Last Visit: 01/18/2023   Last Fill: 01/28/2023   Dx: Polymyalgia rheumatica    Current Dose per office note on 01/18/2023: prednisone 8 mg daily    Okay to refill Prednisone?

## 2023-03-04 NOTE — Telephone Encounter (Signed)
Patient advised the plan at her last appointment on 01/18/23 was to try tapering by 1 mg every month. Patient advised she will decrease her prednisone to 7 mg daily for 1 month. Patient expressed understanding.

## 2023-03-04 NOTE — Telephone Encounter (Signed)
Please clarify if she has tapered her prednisone dose?

## 2023-03-04 NOTE — Telephone Encounter (Signed)
Patient states she is taking Prednisone 8 mg. Patient states she has not been advised to taper at this time. Patient has an appointment on 03/20/2023.
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
## 2023-03-08 ENCOUNTER — Other Ambulatory Visit: Payer: Self-pay | Admitting: *Deleted

## 2023-03-08 DIAGNOSIS — M353 Polymyalgia rheumatica: Secondary | ICD-10-CM | POA: Diagnosis not present

## 2023-03-08 DIAGNOSIS — Z79899 Other long term (current) drug therapy: Secondary | ICD-10-CM | POA: Diagnosis not present

## 2023-03-09 LAB — CBC WITH DIFFERENTIAL/PLATELET
Absolute Monocytes: 395 cells/uL (ref 200–950)
Basophils Absolute: 84 cells/uL (ref 0–200)
Basophils Relative: 1.1 %
Eosinophils Absolute: 91 cells/uL (ref 15–500)
Eosinophils Relative: 1.2 %
HCT: 38.7 % (ref 35.0–45.0)
Hemoglobin: 13.1 g/dL (ref 11.7–15.5)
Lymphs Abs: 1467 cells/uL (ref 850–3900)
MCH: 32.1 pg (ref 27.0–33.0)
MCHC: 33.9 g/dL (ref 32.0–36.0)
MCV: 94.9 fL (ref 80.0–100.0)
MPV: 9.1 fL (ref 7.5–12.5)
Monocytes Relative: 5.2 %
Neutro Abs: 5563 cells/uL (ref 1500–7800)
Neutrophils Relative %: 73.2 %
Platelets: 248 10*3/uL (ref 140–400)
RBC: 4.08 10*6/uL (ref 3.80–5.10)
RDW: 14.1 % (ref 11.0–15.0)
Total Lymphocyte: 19.3 %
WBC: 7.6 10*3/uL (ref 3.8–10.8)

## 2023-03-09 LAB — COMPLETE METABOLIC PANEL WITH GFR
AG Ratio: 2 (calc) (ref 1.0–2.5)
ALT: 19 U/L (ref 6–29)
AST: 22 U/L (ref 10–35)
Albumin: 4.3 g/dL (ref 3.6–5.1)
Alkaline phosphatase (APISO): 45 U/L (ref 37–153)
BUN/Creatinine Ratio: 12 (calc) (ref 6–22)
BUN: 13 mg/dL (ref 7–25)
CO2: 28 mmol/L (ref 20–32)
Calcium: 9 mg/dL (ref 8.6–10.4)
Chloride: 103 mmol/L (ref 98–110)
Creat: 1.06 mg/dL — ABNORMAL HIGH (ref 0.60–1.00)
Globulin: 2.1 g/dL (calc) (ref 1.9–3.7)
Glucose, Bld: 76 mg/dL (ref 65–99)
Potassium: 4.2 mmol/L (ref 3.5–5.3)
Sodium: 140 mmol/L (ref 135–146)
Total Bilirubin: 0.5 mg/dL (ref 0.2–1.2)
Total Protein: 6.4 g/dL (ref 6.1–8.1)
eGFR: 55 mL/min/{1.73_m2} — ABNORMAL LOW (ref >=60)

## 2023-03-09 LAB — SEDIMENTATION RATE: Sed Rate: 9 mm/h (ref 0–30)

## 2023-03-11 ENCOUNTER — Other Ambulatory Visit: Payer: Self-pay | Admitting: *Deleted

## 2023-03-11 DIAGNOSIS — R768 Other specified abnormal immunological findings in serum: Secondary | ICD-10-CM

## 2023-03-11 DIAGNOSIS — R7982 Elevated C-reactive protein (CRP): Secondary | ICD-10-CM

## 2023-03-11 DIAGNOSIS — M353 Polymyalgia rheumatica: Secondary | ICD-10-CM

## 2023-03-11 DIAGNOSIS — Z79899 Other long term (current) drug therapy: Secondary | ICD-10-CM

## 2023-03-11 MED ORDER — METHOTREXATE SODIUM 2.5 MG PO TABS: ORAL_TABLET | ORAL | 0 refills | Status: DC

## 2023-03-11 NOTE — Progress Notes (Signed)
CBC WNL.  ESR WNL. Creatinine is borderline elevated-1.06. and GFR is low-55.  Rest of CMP WNL.   Recommend reducing methotrexate to 7 tablets once weekly.  Avoid the use of NSAIDs. Recheck BMP With GFR in 2 weeks.

## 2023-03-13 ENCOUNTER — Encounter: Payer: Medicare HMO | Admitting: Family Medicine

## 2023-03-14 ENCOUNTER — Telehealth (INDEPENDENT_AMBULATORY_CARE_PROVIDER_SITE_OTHER): Payer: Medicare HMO | Admitting: Family Medicine

## 2023-03-14 ENCOUNTER — Encounter: Payer: Self-pay | Admitting: Family Medicine

## 2023-03-14 ENCOUNTER — Telehealth: Payer: Self-pay | Admitting: *Deleted

## 2023-03-14 VITALS — Temp 98.3°F

## 2023-03-14 DIAGNOSIS — R110 Nausea: Secondary | ICD-10-CM | POA: Diagnosis not present

## 2023-03-14 DIAGNOSIS — U071 COVID-19: Secondary | ICD-10-CM

## 2023-03-14 MED ORDER — NIRMATRELVIR/RITONAVIR (PAXLOVID) TABLET (RENAL DOSING): 2.0000 | ORAL_TABLET | Freq: Two times a day (BID) | ORAL | 0 refills | Status: AC

## 2023-03-14 MED ORDER — ONDANSETRON HCL 4 MG PO TABS: 4.0000 mg | ORAL_TABLET | Freq: Three times a day (TID) | ORAL | 0 refills | Status: AC | PRN

## 2023-03-14 NOTE — Telephone Encounter (Signed)
Patient's husband who is on dpr called the office stating that the patient is very sick. He states that she was unable to keep anything down yesterday and slept all day and all night. Patient's husband states they did a home Covid test and Marabeth tested positive. Patient is on MTX and Prednisone. Advised him to have Vaughan Basta hold her MTX until 2 weeks after her symptoms resolved. Also advised them to reach out to PCP for treatment.

## 2023-03-14 NOTE — Progress Notes (Signed)
Sxs started on Tuesday. She reports having diarrhea, nausea,body aches, sinus drainage.   She tested this morning and her test was positive.    Patient is on MTX and Prednisone. She was advised to hold her MTX until 2 weeks after her symptoms resolved. And told to f/u reach out to PCP for treatment.

## 2023-03-14 NOTE — Progress Notes (Signed)
    Virtual Visit via Video Note  I connected with Deborah Mccullough on 03/14/23 at 11:10 AM EDT by a video enabled telemedicine application and verified that I am speaking with the correct person using two identifiers.   I discussed the limitations of evaluation and management by telemedicine and the availability of in person appointments. The patient expressed understanding and agreed to proceed.  Patient location: at home Provider location: in office  Subjective:    CC:   Chief Complaint  Patient presents with   Covid Positive    HPI: Sxs started on Tuesday. She reports having diarrhea, nausea,body aches, sinus drainage.  Ears are stopped up.  Heavy breathing. No SOB.     She tested this morning and her test was positive. She has never had COVID    Patient is on MTX and Prednisone. She was advised to hold her MTX until 2 weeks after her symptoms resolved. And told to f/u reach out to PCP for treatment  Some blood in the nose.  Did have nose bleed last weekend.       Past medical history, Surgical history, Family history not pertinant except as noted below, Social history, Allergies, and medications have been entered into the medical record, reviewed, and corrections made.    Objective:    General: Speaking clearly in complete sentences without any shortness of breath.  Alert and oriented x3.  Normal judgment. No apparent acute distress.    Impression and Recommendations:    Problem List Items Addressed This Visit   None Visit Diagnoses     Nausea    -  Primary   Relevant Medications   ondansetron (ZOFRAN) 4 MG tablet   COVID-19       Relevant Medications   nirmatrelvir/ritonavir, renal dosing, (PAXLOVID) 10 x 150 MG & 10 x 100MG  TABS       No orders of the defined types were placed in this encounter.   Meds ordered this encounter  Medications   nirmatrelvir/ritonavir, renal dosing, (PAXLOVID) 10 x 150 MG & 10 x 100MG  TABS    Sig: Take 2 tablets by mouth 2  (two) times daily for 5 days. (Take nirmatrelvir 150 mg one tablet twice daily for 5 days and ritonavir 100 mg one tablet twice daily for 5 days) Patient GFR is 55    Dispense:  20 tablet    Refill:  0   ondansetron (ZOFRAN) 4 MG tablet    Sig: Take 1 tablet (4 mg total) by mouth every 8 (eight) hours as needed for nausea or vomiting.    Dispense:  12 tablet    Refill:  0   COVID -19 - discussed safety protocols. Call if getting worse.  Discussed Paxlovid, renal dose.  Call if not better in one week.     I discussed the assessment and treatment plan with the patient. The patient was provided an opportunity to ask questions and all were answered. The patient agreed with the plan and demonstrated an understanding of the instructions.   The patient was advised to call back or seek an in-person evaluation if the symptoms worsen or if the condition fails to improve as anticipated.  I spent 18 minutes on the day of the encounter to include pre-visit record review, face-to-face time with the patient and post visit ordering of test.   Beatrice Lecher, MD

## 2023-03-18 DIAGNOSIS — U071 COVID-19: Secondary | ICD-10-CM

## 2023-03-18 HISTORY — DX: COVID-19: U07.1

## 2023-03-20 ENCOUNTER — Ambulatory Visit: Payer: Medicare HMO | Admitting: Rheumatology

## 2023-03-20 DIAGNOSIS — F32A Depression, unspecified: Secondary | ICD-10-CM

## 2023-03-20 DIAGNOSIS — M353 Polymyalgia rheumatica: Secondary | ICD-10-CM

## 2023-03-20 DIAGNOSIS — I10 Essential (primary) hypertension: Secondary | ICD-10-CM

## 2023-03-20 DIAGNOSIS — M25551 Pain in right hip: Secondary | ICD-10-CM

## 2023-03-20 DIAGNOSIS — Z8669 Personal history of other diseases of the nervous system and sense organs: Secondary | ICD-10-CM

## 2023-03-20 DIAGNOSIS — F5101 Primary insomnia: Secondary | ICD-10-CM

## 2023-03-20 DIAGNOSIS — R768 Other specified abnormal immunological findings in serum: Secondary | ICD-10-CM

## 2023-03-20 DIAGNOSIS — Z79899 Other long term (current) drug therapy: Secondary | ICD-10-CM

## 2023-03-20 DIAGNOSIS — M19032 Primary osteoarthritis, left wrist: Secondary | ICD-10-CM

## 2023-03-20 DIAGNOSIS — R5383 Other fatigue: Secondary | ICD-10-CM

## 2023-03-20 DIAGNOSIS — R7982 Elevated C-reactive protein (CRP): Secondary | ICD-10-CM

## 2023-03-20 DIAGNOSIS — M1712 Unilateral primary osteoarthritis, left knee: Secondary | ICD-10-CM

## 2023-03-20 DIAGNOSIS — E7800 Pure hypercholesterolemia, unspecified: Secondary | ICD-10-CM

## 2023-03-20 DIAGNOSIS — M19042 Primary osteoarthritis, left hand: Secondary | ICD-10-CM

## 2023-03-20 DIAGNOSIS — M19012 Primary osteoarthritis, left shoulder: Secondary | ICD-10-CM

## 2023-03-20 DIAGNOSIS — M810 Age-related osteoporosis without current pathological fracture: Secondary | ICD-10-CM

## 2023-03-20 DIAGNOSIS — G8929 Other chronic pain: Secondary | ICD-10-CM

## 2023-03-25 ENCOUNTER — Other Ambulatory Visit: Payer: Self-pay | Admitting: Family Medicine

## 2023-03-25 DIAGNOSIS — F5101 Primary insomnia: Secondary | ICD-10-CM

## 2023-03-26 ENCOUNTER — Encounter: Payer: Self-pay | Admitting: Family Medicine

## 2023-03-27 NOTE — Telephone Encounter (Signed)
Patient  already scheduled for Friday 03/29/23.

## 2023-03-27 NOTE — Telephone Encounter (Signed)
See if can put in her an acute on on Friday. Make sure hydrating. And try to move around more.  Avoid laying in bed.
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
## 2023-03-29 ENCOUNTER — Encounter: Payer: Self-pay | Admitting: Family Medicine

## 2023-03-29 ENCOUNTER — Ambulatory Visit (INDEPENDENT_AMBULATORY_CARE_PROVIDER_SITE_OTHER): Payer: Medicare HMO | Admitting: Family Medicine

## 2023-03-29 VITALS — BP 114/50 | HR 70 | Temp 98.9°F | Ht 62.0 in | Wt 153.0 lb

## 2023-03-29 DIAGNOSIS — Z79899 Other long term (current) drug therapy: Secondary | ICD-10-CM

## 2023-03-29 DIAGNOSIS — J019 Acute sinusitis, unspecified: Secondary | ICD-10-CM | POA: Diagnosis not present

## 2023-03-29 DIAGNOSIS — U071 COVID-19: Secondary | ICD-10-CM | POA: Diagnosis not present

## 2023-03-29 DIAGNOSIS — H6122 Impacted cerumen, left ear: Secondary | ICD-10-CM

## 2023-03-29 MED ORDER — PREDNISONE 20 MG PO TABS: 20.0000 mg | ORAL_TABLET | Freq: Every day | ORAL | 0 refills | Status: DC

## 2023-03-29 MED ORDER — AZITHROMYCIN 250 MG PO TABS: ORAL_TABLET | ORAL | 0 refills | Status: AC

## 2023-03-29 NOTE — Progress Notes (Signed)
Established Patient Office Visit  Subjective   Patient ID: Deborah Mccullough, female    DOB: [birth year]  Age: 72 y.o. MRN: [id number]  Chief Complaint  Patient presents with   Follow-up    F/u covid sxs     HPI Recently tested post for COVID 19 about 2-1/2 weeks ago. She did test pos to Paxlovid though had some nausea and some rash.  Running humidifier. Upper teeth, jaw and eye pain. Has been sore.  Has been trying to hydrate.  Her ears feel full.  She is also been getting some blood particularly out of her left nostril so she has been trying to use a little nasal saline and let it moisten the tissue and then gently blow out.  But she still noticing a little blood. BP has been low under 100  Has been holding her methotrexate for about 3 weeks now.    ROS    Objective:     BP (!) 114/50   Pulse 70   Temp 98.9 F (37.2 C)   Ht  (1.575 m)   Wt 153 lb (69.4 kg)   SpO2 95%   BMI 27.98 kg/m    Physical Exam Constitutional:      Appearance: She is well-developed.  HENT:     Head: Normocephalic and atraumatic.     Right Ear: Tympanic membrane, ear canal and external ear normal.     Left Ear: External ear normal.     Ears:     Comments: LEFT TM blocked by cerumen.      Nose: Nose normal.     Comments: There is some thick yellow stringy mucus in the left nostril.  I do not see a scab or ulceration. Eyes:     Conjunctiva/sclera: Conjunctivae normal.     Pupils: Pupils are equal, round, and reactive to light.  Neck:     Thyroid: No thyromegaly.  Cardiovascular:     Rate and Rhythm: Normal rate and regular rhythm.     Heart sounds: Normal heart sounds.  Pulmonary:     Effort: Pulmonary effort is normal.     Breath sounds: Normal breath sounds. No wheezing.  Musculoskeletal:     Cervical back: Neck supple.  Lymphadenopathy:     Cervical: No cervical adenopathy.  Skin:    General: Skin is warm and dry.  Neurological:     Mental Status: She is alert and  oriented to person, place, and time.      No results found for any visits on 03/29/23.    The 10-year ASCVD risk score (Arnett DK, et al., 2019) is: 13.5%    Assessment & Plan:   Problem List Items Addressed This Visit   None Visit Diagnoses     Impacted cerumen of left ear    -  Primary   High risk medication use       Acute non-recurrent sinusitis, unspecified location       Relevant Medications   predniSONE (DELTASONE) 20 MG tablet   azithromycin (ZITHROMAX) 250 MG tablet   COVID-19       Relevant Medications   azithromycin (ZITHROMAX) 250 MG tablet       Acute sinusitis status post COVID-we will go ahead and treat with azithromycin.  Since she is also on chronic low-dose steroids and also get a give her 5 days of 20 mg to try to bump up the immune system a little bit.  And see if this helps her feel  better especially in regards to just general fatigue and achiness.  She is gena follow back up with her rheumatologist when she is feeling better since she has been holding the methotrexate and they are planning on rechecking her renal function.  No follow-ups on file.    Nani Gasser, MD

## 2023-03-29 NOTE — Progress Notes (Signed)
Pt has a cough that produces clear mucus. No f/s/c. Her husband stated that if she blows her nose that she would blow out blood. They have been running the humidifier to help with this.  She c/o upper teeth,jaw,eyes being painful and sore. She has also noted that her blood pressures have been low.

## 2023-03-29 NOTE — Patient Instructions (Signed)
If your sinuses are not feeling much better by Monday or Tuesday then please let me know.

## 2023-03-31 ENCOUNTER — Other Ambulatory Visit: Payer: Self-pay | Admitting: Physician Assistant

## 2023-04-01 NOTE — Telephone Encounter (Signed)
Last Fill: 01/10/2023  Labs: 03/08/2023 CBC WNL. ESR WNL. Creatinine is borderline elevated-1.06. and GFR is low-55.  Rest of CMP WNL  Next Visit: 04/10/2023  Last Visit: 01/18/2023  DX: Polymyalgia rheumatica   Current Dose per lab note 03/08/2023: Recommend reducing methotrexate to 7 tablets once weekly.   Okay to refill Methotrexate?

## 2023-04-01 NOTE — Telephone Encounter (Signed)
I would recommend having repeat CBC and CMP prior to restarting methotrexate.    Ok to restart MTX as long as she has completed all antibiotics/antivirals and her symptoms have completely resolved.

## 2023-04-03 ENCOUNTER — Encounter: Payer: Self-pay | Admitting: Family Medicine

## 2023-04-03 ENCOUNTER — Encounter: Payer: Medicare HMO | Admitting: Family Medicine

## 2023-04-04 ENCOUNTER — Other Ambulatory Visit: Payer: Self-pay | Admitting: Physician Assistant

## 2023-04-04 NOTE — Telephone Encounter (Signed)
Last Fill: 03/04/2023  Next Visit:04/10/2023  Last Visit:  02/09/2023  Dx: Polymyalgia rheumatica   Current Dose per office note on 01/18/2023: prednisone 8 mg daily. tapering by 1 mg every month.   Patient states she is still having some issues lingering from Covid. Patient states she developed a sinus infection and was placed on antibiotics. Patient states she was also given Prednisone 20 mg which she took the last dose of yesterday. Patient is now taking 7 mg as she was prior to being diagnosed with Covid. Patient would like to know if she should stay on 7 mg or how she should be taking Prednisone at this time.   Okay to refill Prednisone?

## 2023-04-04 NOTE — Telephone Encounter (Signed)
Plan to continue to taper by 1 mg every month.   Ok to resume prednisone 7 mg daily.

## 2023-04-04 NOTE — Telephone Encounter (Signed)
Spoke with patient and advised patient plan to continue to taper by 1 mg every month.   Ok to resume prednisone 7 mg daily.  Patient states she has been on 7 mg for almost a month prior to being placed on the 20 mg of Prednisone. Patient states she will taper to 6 mg next week.

## 2023-04-10 ENCOUNTER — Encounter: Payer: Self-pay | Admitting: Rheumatology

## 2023-04-10 ENCOUNTER — Ambulatory Visit: Payer: Medicare HMO | Attending: Rheumatology | Admitting: Rheumatology

## 2023-04-10 VITALS — BP 130/83 | HR 60 | Resp 17 | Ht 62.0 in | Wt 154.2 lb

## 2023-04-10 DIAGNOSIS — M19041 Primary osteoarthritis, right hand: Secondary | ICD-10-CM | POA: Diagnosis not present

## 2023-04-10 DIAGNOSIS — M810 Age-related osteoporosis without current pathological fracture: Secondary | ICD-10-CM

## 2023-04-10 DIAGNOSIS — M19012 Primary osteoarthritis, left shoulder: Secondary | ICD-10-CM | POA: Diagnosis not present

## 2023-04-10 DIAGNOSIS — R7982 Elevated C-reactive protein (CRP): Secondary | ICD-10-CM | POA: Diagnosis not present

## 2023-04-10 DIAGNOSIS — M19042 Primary osteoarthritis, left hand: Secondary | ICD-10-CM

## 2023-04-10 DIAGNOSIS — M353 Polymyalgia rheumatica: Secondary | ICD-10-CM

## 2023-04-10 DIAGNOSIS — R5383 Other fatigue: Secondary | ICD-10-CM | POA: Diagnosis not present

## 2023-04-10 DIAGNOSIS — F419 Anxiety disorder, unspecified: Secondary | ICD-10-CM

## 2023-04-10 DIAGNOSIS — M19032 Primary osteoarthritis, left wrist: Secondary | ICD-10-CM | POA: Diagnosis not present

## 2023-04-10 DIAGNOSIS — F32A Depression, unspecified: Secondary | ICD-10-CM

## 2023-04-10 DIAGNOSIS — M25511 Pain in right shoulder: Secondary | ICD-10-CM

## 2023-04-10 DIAGNOSIS — I10 Essential (primary) hypertension: Secondary | ICD-10-CM

## 2023-04-10 DIAGNOSIS — Z8669 Personal history of other diseases of the nervous system and sense organs: Secondary | ICD-10-CM

## 2023-04-10 DIAGNOSIS — M25551 Pain in right hip: Secondary | ICD-10-CM

## 2023-04-10 DIAGNOSIS — G8929 Other chronic pain: Secondary | ICD-10-CM

## 2023-04-10 DIAGNOSIS — U071 COVID-19: Secondary | ICD-10-CM

## 2023-04-10 DIAGNOSIS — M25512 Pain in left shoulder: Secondary | ICD-10-CM

## 2023-04-10 DIAGNOSIS — R7689 Other specified abnormal immunological findings in serum: Secondary | ICD-10-CM

## 2023-04-10 DIAGNOSIS — E7800 Pure hypercholesterolemia, unspecified: Secondary | ICD-10-CM

## 2023-04-10 DIAGNOSIS — F5101 Primary insomnia: Secondary | ICD-10-CM

## 2023-04-10 DIAGNOSIS — M1712 Unilateral primary osteoarthritis, left knee: Secondary | ICD-10-CM

## 2023-04-10 DIAGNOSIS — M25552 Pain in left hip: Secondary | ICD-10-CM

## 2023-04-10 DIAGNOSIS — Z79899 Other long term (current) drug therapy: Secondary | ICD-10-CM

## 2023-04-10 DIAGNOSIS — R768 Other specified abnormal immunological findings in serum: Secondary | ICD-10-CM | POA: Diagnosis not present

## 2023-04-10 NOTE — Patient Instructions (Addendum)
You may reduce the dose of methotrexate to 6 tablets p.o. weekly  Standing Labs We placed an order today for your standing lab work.   Please have your standing labs drawn in July and every 3 months  Please have your labs drawn 2 weeks prior to your appointment so that the provider can discuss your lab results at your appointment, if possible.  Please note that you may see your imaging and lab results in MyChart before we have reviewed them. We will contact you once all results are reviewed. Please allow our office up to 72 hours to thoroughly review all of the results before contacting the office for clarification of your results.  WALK-IN LAB HOURS  Monday through Thursday from 8:00 am -12:30 pm and 1:00 pm-5:00 pm and Friday from 8:00 am-12:00 pm.  Patients with office visits requiring labs will be seen before walk-in labs.  You may encounter longer than normal wait times. Please allow additional time. Wait times may be shorter on  Monday and Thursday afternoons.  We do not book appointments for walk-in labs. We appreciate your patience and understanding with our staff.   Labs are drawn by [facility]. Please bring your co-pay at the time of your lab draw.  You may receive a bill from [facility] for your lab work.  Please note if you are on Hydroxychloroquine and and an order has been placed for a Hydroxychloroquine level,  you will need to have it drawn 4 hours or more after your last dose.  If you wish to have your labs drawn at another location, please call the office 24 hours in advance so we can fax the orders.  The office is located at [address], [place], [place] [address]   If you have any questions regarding directions or hours of operation,  please call [phone].   As a reminder, please drink plenty of water prior to coming for your lab work. Thanks!   Vaccines You are taking a medication(s) that can suppress your immune system.  The following immunizations are  recommended: Flu annually Covid-19  Td/Tdap (tetanus, diphtheria, pertussis) every 10 years Pneumonia (Prevnar 15 then Pneumovax 23 at least 1 year apart.  Alternatively, can take Prevnar 20 without needing additional dose) Shingrix: 2 doses from 4 weeks to 6 months apart  Please check with your PCP to make sure you are up to date.   If you have signs or symptoms of an infection or start antibiotics: First, call your PCP for workup of your infection. Hold your medication through the infection, until you complete your antibiotics, and until symptoms resolve if you take the following: Injectable medication (Actemra, Benlysta, Cimzia, Cosentyx, Enbrel, Humira, Kevzara, Orencia, Remicade, Simponi, Stelara, Taltz, Tremfya) Methotrexate Leflunomide (Arava) Mycophenolate (Cellcept) Harriette Ohara, Olumiant, or Rinvoq

## 2023-04-11 LAB — CBC WITH DIFFERENTIAL/PLATELET
Absolute Monocytes: 653 cells/uL (ref 200–950)
Basophils Absolute: 79 cells/uL (ref 0–200)
Basophils Relative: 0.8 %
Eosinophils Absolute: 99 cells/uL (ref 15–500)
Eosinophils Relative: 1 %
HCT: 35.9 % (ref 35.0–45.0)
Hemoglobin: 12 g/dL (ref 11.7–15.5)
Lymphs Abs: 1733 cells/uL (ref 850–3900)
MCH: 32 pg (ref 27.0–33.0)
MCHC: 33.4 g/dL (ref 32.0–36.0)
MCV: 95.7 fL (ref 80.0–100.0)
MPV: 9.3 fL (ref 7.5–12.5)
Monocytes Relative: 6.6 %
Neutro Abs: 7336 cells/uL (ref 1500–7800)
Neutrophils Relative %: 74.1 %
Platelets: 232 10*3/uL (ref 140–400)
RBC: 3.75 10*6/uL — ABNORMAL LOW (ref 3.80–5.10)
RDW: 14 % (ref 11.0–15.0)
Total Lymphocyte: 17.5 %
WBC: 9.9 10*3/uL (ref 3.8–10.8)

## 2023-04-11 LAB — COMPLETE METABOLIC PANEL WITH GFR
AG Ratio: 1.9 (calc) (ref 1.0–2.5)
ALT: 17 U/L (ref 6–29)
AST: 18 U/L (ref 10–35)
Albumin: 3.9 g/dL (ref 3.6–5.1)
Alkaline phosphatase (APISO): 42 U/L (ref 37–153)
BUN: 16 mg/dL (ref 7–25)
CO2: 28 mmol/L (ref 20–32)
Calcium: 9.3 mg/dL (ref 8.6–10.4)
Chloride: 103 mmol/L (ref 98–110)
Creat: 0.89 mg/dL (ref 0.60–1.00)
Globulin: 2.1 g/dL (calc) (ref 1.9–3.7)
Glucose, Bld: 84 mg/dL (ref 65–99)
Potassium: 4.1 mmol/L (ref 3.5–5.3)
Sodium: 139 mmol/L (ref 135–146)
Total Bilirubin: 0.5 mg/dL (ref 0.2–1.2)
Total Protein: 6 g/dL — ABNORMAL LOW (ref 6.1–8.1)
eGFR: 68 mL/min/{1.73_m2} (ref >=60)

## 2023-04-11 NOTE — Progress Notes (Signed)
CBC and CMP are stable.

## 2023-04-11 NOTE — Progress Notes (Signed)
Yes, we discussed yesterday that she will be tapering prednisone by 1 mg every month.

## 2023-05-03 ENCOUNTER — Other Ambulatory Visit: Payer: Self-pay | Admitting: Physician Assistant

## 2023-05-03 NOTE — Telephone Encounter (Signed)
Last Fill: 04/04/2023  Next Visit: 07/12/2023  Last Visit: 04/10/2023  Dx: Polymyalgia rheumatica   Current Dose per office note on 04/10/2023: tapering prednisone by 1 mg every month   Spoke with patient and she states she is currently taking Prednisone 6 mg and Wednesday of next week she will taper to 5 mg daily.   Okay to refill Prednisone?

## 2023-05-11 ENCOUNTER — Other Ambulatory Visit: Payer: Self-pay | Admitting: Family Medicine

## 2023-05-20 ENCOUNTER — Other Ambulatory Visit: Payer: Self-pay | Admitting: Family Medicine

## 2023-05-20 DIAGNOSIS — M810 Age-related osteoporosis without current pathological fracture: Secondary | ICD-10-CM

## 2023-05-22 ENCOUNTER — Encounter: Payer: Medicare HMO | Admitting: Family Medicine

## 2023-05-22 ENCOUNTER — Telehealth: Payer: Self-pay | Admitting: Family Medicine

## 2023-05-22 DIAGNOSIS — M810 Age-related osteoporosis without current pathological fracture: Secondary | ICD-10-CM

## 2023-05-22 MED ORDER — ALENDRONATE SODIUM 70 MG PO TABS: 70.0000 mg | ORAL_TABLET | ORAL | 3 refills | Status: DC

## 2023-05-22 NOTE — Telephone Encounter (Signed)
Yes please fast.  It looks like the medication was discontinued back in October which is why when I sent the med refill request it was denied because it did not look like it was an active prescription anymore.  But I did go ahead and send over a refill today.

## 2023-05-22 NOTE — Telephone Encounter (Signed)
Called and spoke with patient she is aware to come in fasting and her refill was sent over to the pharmacy

## 2023-05-22 NOTE — Telephone Encounter (Signed)
Patient has upcoming appointment she wants to know if she needs to be fasting for her physical and she needs to know why her Aledndronate Sodium was denied

## 2023-05-29 ENCOUNTER — Other Ambulatory Visit: Payer: Self-pay | Admitting: Family Medicine

## 2023-05-29 DIAGNOSIS — F411 Generalized anxiety disorder: Secondary | ICD-10-CM

## 2023-05-31 ENCOUNTER — Ambulatory Visit: Payer: Medicare HMO

## 2023-05-31 ENCOUNTER — Ambulatory Visit
Admission: RE | Admit: 2023-05-31 | Discharge: 2023-05-31 | Disposition: A | Discharge status: Home / Self Care | Payer: Medicare HMO | Source: Ambulatory Visit | Attending: Family Medicine | Admitting: Family Medicine

## 2023-05-31 DIAGNOSIS — N63 Unspecified lump in unspecified breast: Secondary | ICD-10-CM

## 2023-05-31 DIAGNOSIS — N6325 Unspecified lump in the left breast, overlapping quadrants: Secondary | ICD-10-CM | POA: Diagnosis not present

## 2023-06-14 ENCOUNTER — Other Ambulatory Visit: Payer: Self-pay | Admitting: Family Medicine

## 2023-06-14 ENCOUNTER — Other Ambulatory Visit: Payer: Self-pay | Admitting: Physician Assistant

## 2023-06-14 DIAGNOSIS — Z1231 Encounter for screening mammogram for malignant neoplasm of breast: Secondary | ICD-10-CM

## 2023-06-14 NOTE — Telephone Encounter (Signed)
Last Fill: 04/04/2023  Next Visit: 07/12/2023  Last Visit: 04/10/2023  Dx: Polymyalgia rheumatica   Current Dose per office note on 04/10/2023: prednisone 6 tablets p.o. daily   Okay to refill Prednisone?

## 2023-06-17 ENCOUNTER — Other Ambulatory Visit: Payer: Self-pay | Admitting: *Deleted

## 2023-06-17 NOTE — Telephone Encounter (Signed)
Patient contacted the office stating that she is on Prednisone 4 mg daily until 07/06/2023. Patient states the prescription she picked up from the pharmacy was for only 1 mg of Prednisone daily. Patient is requesting prescription to be sent to the pharmacy for 4 tablets daily.   Next Visit: 07/12/2023  Last Visit: 04/10/2023  Dx: Polymyalgia rheumatica   Current Dose per office note on 04/10/2023: currently on prednisone 6 tablets p.o. daily. she will be tapering prednisone by 1 mg every month.   Okay to refill Prednisone?

## 2023-06-18 ENCOUNTER — Other Ambulatory Visit: Payer: Self-pay | Admitting: Family Medicine

## 2023-06-18 MED ORDER — PREDNISONE 1 MG PO TABS: 4.0000 mg | ORAL_TABLET | Freq: Every day | ORAL | 0 refills | Status: DC

## 2023-06-25 ENCOUNTER — Ambulatory Visit (INDEPENDENT_AMBULATORY_CARE_PROVIDER_SITE_OTHER): Payer: Medicare HMO | Admitting: Family Medicine

## 2023-06-25 ENCOUNTER — Encounter: Payer: Self-pay | Admitting: Family Medicine

## 2023-06-25 VITALS — BP 128/64 | HR 71 | Ht 62.0 in | Wt 154.0 lb

## 2023-06-25 DIAGNOSIS — Z7952 Long term (current) use of systemic steroids: Secondary | ICD-10-CM | POA: Diagnosis not present

## 2023-06-25 DIAGNOSIS — F339 Major depressive disorder, recurrent, unspecified: Secondary | ICD-10-CM | POA: Diagnosis not present

## 2023-06-25 DIAGNOSIS — E7800 Pure hypercholesterolemia, unspecified: Secondary | ICD-10-CM

## 2023-06-25 DIAGNOSIS — Z79899 Other long term (current) drug therapy: Secondary | ICD-10-CM | POA: Diagnosis not present

## 2023-06-25 DIAGNOSIS — Z0000 Encounter for general adult medical examination without abnormal findings: Secondary | ICD-10-CM

## 2023-06-25 LAB — CBC WITH DIFFERENTIAL/PLATELET
Absolute Monocytes: 452 cells/uL (ref 200–950)
Basophils Relative: 0.8 %
Eosinophils Absolute: 62 cells/uL (ref 15–500)
Eosinophils Relative: 1.2 %
HCT: 37.1 % (ref 35.0–45.0)
Hemoglobin: 12.5 g/dL (ref 11.7–15.5)
Lymphs Abs: 1414 cells/uL (ref 850–3900)
MCHC: 33.7 g/dL (ref 32.0–36.0)
Total Lymphocyte: 27.2 %

## 2023-06-25 NOTE — Patient Instructions (Addendum)
Please get your tetanus vaccine done at your local pharmacy.  It should be free with your Medicare plan.  It still recommended to have an updated tetanus every 10 years.  Please bring in a copy of your Living will or any Advanced Directives that you have. You can also send them via My Chart.

## 2023-06-25 NOTE — Progress Notes (Signed)
Complete physical exam  Patient: Deborah Mccullough   DOB: May 06, 1951   72 y.o. Female  MRN: [id number]  Subjective:    Chief Complaint  Patient presents with   Annual Exam    Deborah Mccullough is a 72 y.o. female who presents today for a complete physical exam. She reports consuming a general diet. She generally feels well. . She does not have additional problems to discuss today.  She does have several skin lesions she would like me to look at today.  Pneumogram is up-to-date.  She also reports that her shingles vaccine is up-to-date.  We have the first 1 on file but we do not have documentation of the second 1 so we will need to call and get that updated.  Most recent fall risk assessment:    06/25/2023    9:00 AM  Fall Risk   Falls in the past year? 0  Number falls in past yr: 0  Injury with Fall? 0  Risk for fall due to : No Fall Risks  Follow up Falls evaluation completed     Most recent depression screenings:    06/25/2023    9:00 AM 02/27/2023   10:39 AM  PHQ 2/9 Scores  PHQ - 2 Score 0 0        Patient Care Team: Agapito Games, MD as PCP - General (Family Medicine)   Outpatient Medications Prior to Visit  Medication Sig   alendronate (FOSAMAX) 70 MG tablet Take 1 tablet (70 mg total) by mouth every 7 (seven) days. Take with a full glass of water on an empty stomach.   atorvastatin (LIPITOR) 20 MG tablet TAKE 1 TABLET BY MOUTH EVERY OTHER DAY   Calcium Carb-Cholecalciferol (CALCIUM 1000 + D PO) Take by mouth.   escitalopram (LEXAPRO) 10 MG tablet TAKE 1 TABLET BY MOUTH DAILY   folic acid (FOLVITE) 1 MG tablet Take 2 tablets (2 mg total) by mouth daily.   losartan (COZAAR) 50 MG tablet TAKE 1 TABLET BY MOUTH DAILY   methotrexate (RHEUMATREX) 2.5 MG tablet Take 7 tablets (17.5 mg total) by mouth once a week. PROTECT FROM LIGHT   metoprolol succinate (TOPROL-XL) 100 MG 24 hr tablet TAKE 1 TABLET BY MOUTH DAILY WITH OR FOLLOWING A MEAL   ondansetron (ZOFRAN)  4 MG tablet Take 1 tablet (4 mg total) by mouth every 8 (eight) hours as needed for nausea or vomiting.   predniSONE (DELTASONE) 1 MG tablet Take 4 tablets (4 mg total) by mouth daily with breakfast. (Patient taking differently: Take 6 mg by mouth daily with breakfast.)   Probiotic Product (PROBIOTIC PO) Take 1 capsule by mouth daily as needed.   VOLTAREN 1 % GEL As needed.   zolpidem (AMBIEN) 5 MG tablet TAKE 1 TABLET BY MOUTH EVERYDAY AT BEDTIME   [DISCONTINUED] SHINGRIX injection Inject 0.5 mLs into the muscle once.   [DISCONTINUED] predniSONE (DELTASONE) 5 MG tablet TAKE 1 TABLET BY MOUTH DAILY WITH BREAKFAST   No facility-administered medications prior to visit.    ROS        Objective:     BP 128/64   Pulse 71   Ht 5\' 2"  (1.575 m)   Wt 154 lb (69.9 kg)   SpO2 97%   BMI 28.17 kg/m    Physical Exam Vitals and nursing note reviewed.  Constitutional:      Appearance: She is well-developed.  HENT:     Head: Normocephalic and atraumatic.     Right  Ear: External ear normal.     Left Ear: External ear normal.     Nose: Nose normal.  Eyes:     Conjunctiva/sclera: Conjunctivae normal.     Pupils: Pupils are equal, round, and reactive to light.  Neck:     Thyroid: No thyromegaly.  Cardiovascular:     Rate and Rhythm: Normal rate and regular rhythm.     Heart sounds: Normal heart sounds.  Pulmonary:     Effort: Pulmonary effort is normal.     Breath sounds: Normal breath sounds. No wheezing.  Abdominal:     General: Bowel sounds are normal.     Palpations: Abdomen is soft.  Musculoskeletal:     Cervical back: Neck supple.  Lymphadenopathy:     Cervical: No cervical adenopathy.  Skin:    General: Skin is warm and dry.     Coloration: Skin is not pale.     Comments: Has diffuse seborrheic keratoses over her body, solar lentigo, a few cherry angiomas and a few skin tags around her neck and in the right axilla.  Neurological:     Mental Status: She is alert and  oriented to person, place, and time.  Psychiatric:        Behavior: Behavior normal.      No results found for any visits on 06/25/23.     Assessment & Plan:    Routine Health Maintenance and Physical Exam  Immunization History  Administered Date(s) Administered   Fluad Quad(high Dose 65+) 09/14/2020, 09/19/2021, 10/17/2022   Influenza Split 09/20/2011, 09/24/2012   Influenza Whole 10/12/2008, 10/30/2010   Influenza, High Dose Seasonal PF 10/31/2016, 09/30/2017, 10/10/2018, 09/22/2019   Influenza,inj,Quad PF,6+ Mos 09/02/2013, 10/19/2014, 10/31/2015   Influenza,inj,quad, With Preservative 09/16/2018   Influenza-Unspecified 09/16/2018   PFIZER(Purple Top)SARS-COV-2 Vaccination 01/06/2020, 01/27/2020, 09/27/2020, 06/26/2021   PNEUMOCOCCAL CONJUGATE-20 04/03/2022   Pneumococcal Conjugate-13 10/31/2015   Pneumococcal Polysaccharide-23 12/24/2016   Td 12/15/2009   Zoster Recombinant(Shingrix) 04/12/2023   Zoster, Live 09/20/2011    Health Maintenance  Topic Date Due   DTaP/Tdap/Td (2 - Tdap) 12/16/2019   COVID-19 Vaccine (5 - 2023-24 season) 08/17/2022   Zoster Vaccines- Shingrix (2 of 2) 06/07/2023   INFLUENZA VACCINE  07/18/2023   Medicare Annual Wellness (AWV)  10/18/2023   MAMMOGRAM  11/19/2024   Colonoscopy  01/11/2025   Pneumonia Vaccine 2+ Years old  Completed   DEXA SCAN  Completed   Hepatitis C Screening  Completed   HPV VACCINES  Aged Out    Discussed health benefits of physical activity, and encouraged her to engage in regular exercise appropriate for her age and condition.  Problem List Items Addressed This Visit       Other   HYPERCHOLESTEROLEMIA   Relevant Orders   Lipid Panel w/reflex Direct LDL   Hemoglobin A1c   Depression, recurrent (HCC)   Other Visit Diagnoses     Wellness examination    -  Primary   Current chronic use of systemic steroids       Relevant Orders   Lipid Panel w/reflex Direct LDL   Hemoglobin A1c      Return in about  6 months (around 12/26/2023) for Hypertension.   Keep up a regular exercise program and make sure you are eating a healthy diet Try to eat 4 servings of dairy a day, or if you are lactose intolerant take a calcium with vitamin D daily.  Your vaccines are up to date.  Kircher to get her Tdap updated.  Will get some additional labs today.  She is also due for CBC and CMP with her rheumatologist.  He would like to check with her insurance on removal of skin tags to see if it is a covered service.  I am happy to freeze some of the seborrheic keratoses and remove some of the skin tags at her convenience.  Nani Gasser, MD

## 2023-06-26 LAB — COMPLETE METABOLIC PANEL WITH GFR
AG Ratio: 2.2 (calc) (ref 1.0–2.5)
ALT: 21 U/L (ref 6–29)
AST: 27 U/L (ref 10–35)
Albumin: 4.4 g/dL (ref 3.6–5.1)
Alkaline phosphatase (APISO): 45 U/L (ref 37–153)
BUN: 17 mg/dL (ref 7–25)
CO2: 26 mmol/L (ref 20–32)
Calcium: 9.3 mg/dL (ref 8.6–10.4)
Chloride: 105 mmol/L (ref 98–110)
Creat: 0.96 mg/dL (ref 0.60–1.00)
Globulin: 2 g/dL (calc) (ref 1.9–3.7)
Glucose, Bld: 80 mg/dL (ref 65–99)
Potassium: 4.1 mmol/L (ref 3.5–5.3)
Sodium: 141 mmol/L (ref 135–146)
Total Bilirubin: 0.6 mg/dL (ref 0.2–1.2)
Total Protein: 6.4 g/dL (ref 6.1–8.1)
eGFR: 62 mL/min/{1.73_m2} (ref >=60)

## 2023-06-26 LAB — LIPID PANEL W/REFLEX DIRECT LDL
Cholesterol: 170 mg/dL (ref <200)
HDL: 65 mg/dL (ref >=50)
LDL Cholesterol (Calc): 81 mg/dL (calc)
Non-HDL Cholesterol (Calc): 105 mg/dL (calc) (ref <130)
Total CHOL/HDL Ratio: 2.6 (calc) (ref <5.0)
Triglycerides: 141 mg/dL (ref <150)

## 2023-06-26 LAB — CBC WITH DIFFERENTIAL/PLATELET
Basophils Absolute: 42 cells/uL (ref 0–200)
MCH: 32.7 pg (ref 27.0–33.0)
MCV: 97.1 fL (ref 80.0–100.0)
MPV: 9.2 fL (ref 7.5–12.5)
Monocytes Relative: 8.7 %
Neutro Abs: 3229 cells/uL (ref 1500–7800)
Neutrophils Relative %: 62.1 %
Platelets: 225 10*3/uL (ref 140–400)
RBC: 3.82 10*6/uL (ref 3.80–5.10)
RDW: 13.5 % (ref 11.0–15.0)
WBC: 5.2 10*3/uL (ref 3.8–10.8)

## 2023-06-26 LAB — HEMOGLOBIN A1C
Hgb A1c MFr Bld: 5.5 % of total Hgb (ref <5.7)
Mean Plasma Glucose: 111 mg/dL
eAG (mmol/L): 6.2 mmol/L

## 2023-06-26 NOTE — Progress Notes (Signed)
Your lab work is within acceptable range and there are no concerning findings.   ?

## 2023-06-26 NOTE — Progress Notes (Signed)
Call patient to see what exactly she needs.

## 2023-06-27 ENCOUNTER — Other Ambulatory Visit: Payer: Self-pay | Admitting: Physician Assistant

## 2023-06-27 NOTE — Telephone Encounter (Signed)
Last Fill: 04/01/2023  Labs: 06/25/2023 CBC and CMP WNL  Next Visit: 07/12/2023  Last Visit: 04/10/2023  DX: Polymyalgia rheumatica   Current Dose per office note 04/10/2023: Methotrexate 7 tablets by mouth once weekly   Okay to refill Methotrexate?
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
## 2023-07-04 ENCOUNTER — Other Ambulatory Visit: Payer: Self-pay | Admitting: Family Medicine

## 2023-07-12 ENCOUNTER — Ambulatory Visit: Payer: Medicare HMO | Attending: Physician Assistant | Admitting: Physician Assistant

## 2023-07-12 ENCOUNTER — Encounter: Payer: Self-pay | Admitting: Physician Assistant

## 2023-07-12 VITALS — BP 146/79 | HR 54 | Resp 14 | Ht 62.0 in | Wt 152.6 lb

## 2023-07-12 DIAGNOSIS — R768 Other specified abnormal immunological findings in serum: Secondary | ICD-10-CM | POA: Diagnosis not present

## 2023-07-12 DIAGNOSIS — I10 Essential (primary) hypertension: Secondary | ICD-10-CM

## 2023-07-12 DIAGNOSIS — M1712 Unilateral primary osteoarthritis, left knee: Secondary | ICD-10-CM | POA: Diagnosis not present

## 2023-07-12 DIAGNOSIS — R5383 Other fatigue: Secondary | ICD-10-CM

## 2023-07-12 DIAGNOSIS — M25511 Pain in right shoulder: Secondary | ICD-10-CM | POA: Diagnosis not present

## 2023-07-12 DIAGNOSIS — M19041 Primary osteoarthritis, right hand: Secondary | ICD-10-CM | POA: Diagnosis not present

## 2023-07-12 DIAGNOSIS — M25552 Pain in left hip: Secondary | ICD-10-CM

## 2023-07-12 DIAGNOSIS — G8929 Other chronic pain: Secondary | ICD-10-CM

## 2023-07-12 DIAGNOSIS — M19012 Primary osteoarthritis, left shoulder: Secondary | ICD-10-CM

## 2023-07-12 DIAGNOSIS — U071 COVID-19: Secondary | ICD-10-CM

## 2023-07-12 DIAGNOSIS — M810 Age-related osteoporosis without current pathological fracture: Secondary | ICD-10-CM | POA: Diagnosis not present

## 2023-07-12 DIAGNOSIS — Z79899 Other long term (current) drug therapy: Secondary | ICD-10-CM | POA: Diagnosis not present

## 2023-07-12 DIAGNOSIS — M353 Polymyalgia rheumatica: Secondary | ICD-10-CM | POA: Diagnosis not present

## 2023-07-12 DIAGNOSIS — F5101 Primary insomnia: Secondary | ICD-10-CM

## 2023-07-12 DIAGNOSIS — M19042 Primary osteoarthritis, left hand: Secondary | ICD-10-CM

## 2023-07-12 DIAGNOSIS — M25551 Pain in right hip: Secondary | ICD-10-CM | POA: Diagnosis not present

## 2023-07-12 DIAGNOSIS — R7982 Elevated C-reactive protein (CRP): Secondary | ICD-10-CM | POA: Diagnosis not present

## 2023-07-12 DIAGNOSIS — E7800 Pure hypercholesterolemia, unspecified: Secondary | ICD-10-CM

## 2023-07-12 DIAGNOSIS — R7689 Other specified abnormal immunological findings in serum: Secondary | ICD-10-CM

## 2023-07-12 DIAGNOSIS — F419 Anxiety disorder, unspecified: Secondary | ICD-10-CM

## 2023-07-12 DIAGNOSIS — M19032 Primary osteoarthritis, left wrist: Secondary | ICD-10-CM | POA: Diagnosis not present

## 2023-07-12 DIAGNOSIS — M25512 Pain in left shoulder: Secondary | ICD-10-CM

## 2023-07-12 DIAGNOSIS — Z8669 Personal history of other diseases of the nervous system and sense organs: Secondary | ICD-10-CM

## 2023-07-12 DIAGNOSIS — F32A Depression, unspecified: Secondary | ICD-10-CM

## 2023-07-12 NOTE — Patient Instructions (Addendum)
Biotene products for mouth dryness  Xylimelts for mouth dryness  Refresh and systane eye drops for dry eyes     Standing Labs We placed an order today for your standing lab work.   Please have your standing labs drawn in October and every 3 months   Please have your labs drawn 2 weeks prior to your appointment so that the provider can discuss your lab results at your appointment, if possible.  Please note that you may see your imaging and lab results in MyChart before we have reviewed them. We will contact you once all results are reviewed. Please allow our office up to 72 hours to thoroughly review all of the results before contacting the office for clarification of your results.  WALK-IN LAB HOURS  Monday through Thursday from 8:00 am -12:30 pm and 1:00 pm-5:00 pm and Friday from 8:00 am-12:00 pm.  Patients with office visits requiring labs will be seen before walk-in labs.  You may encounter longer than normal wait times. Please allow additional time. Wait times may be shorter on  Monday and Thursday afternoons.  We do not book appointments for walk-in labs. We appreciate your patience and understanding with our staff.   Labs are drawn by [facility]. Please bring your co-pay at the time of your lab draw.  You may receive a bill from [facility] for your lab work.  Please note if you are on Hydroxychloroquine and and an order has been placed for a Hydroxychloroquine level,  you will need to have it drawn 4 hours or more after your last dose.  If you wish to have your labs drawn at another location, please call the office 24 hours in advance so we can fax the orders.  The office is located at [address], [place], [place] [address]   If you have any questions regarding directions or hours of operation,  please call [phone].   As a reminder, please drink plenty of water prior to coming for your lab work. Thanks!

## 2023-07-28 ENCOUNTER — Other Ambulatory Visit: Payer: Self-pay | Admitting: Rheumatology

## 2023-07-29 NOTE — Telephone Encounter (Signed)
Last Fill: 06/18/2023  Next Visit: 10/15/2023  Last Visit: 07/12/2023  Dx: Polymyalgia rheumatica   Current Dose per office note on 07/12/2023: tapering prednisone by 1 mg every month and is currently on 4 mg daily and will be reducing to 3 mg daily starting 07/13/2023  Okay to refill Prednisone?

## 2023-08-09 DIAGNOSIS — H5213 Myopia, bilateral: Secondary | ICD-10-CM | POA: Diagnosis not present

## 2023-08-14 ENCOUNTER — Other Ambulatory Visit: Payer: Self-pay | Admitting: Family Medicine

## 2023-08-14 DIAGNOSIS — F5101 Primary insomnia: Secondary | ICD-10-CM

## 2023-08-14 MED ORDER — ZOLPIDEM TARTRATE 5 MG PO TABS
ORAL_TABLET | ORAL | 0 refills | Status: DC
Start: 2023-08-14 — End: 2024-01-09

## 2023-08-14 NOTE — Telephone Encounter (Signed)
Routing to pcp for signature

## 2023-08-14 NOTE — Telephone Encounter (Signed)
Prescription Request  08/14/2023  LOV: 06/25/2023  What is the name of the medication or equipment? Zolpidem 5mg   Have you contacted your pharmacy to request a refill? Yes   Which pharmacy would you like this sent to?   [id number] - [place], [place] - [address] [place] [address] Phone: [phone] Fax: [phone]    Patient notified that their request is being sent to the clinical staff for review and that they should receive a response within 2 business days.   Please advise at [facility] [phone]

## 2023-08-25 ENCOUNTER — Other Ambulatory Visit: Payer: Self-pay | Admitting: Family Medicine

## 2023-08-25 DIAGNOSIS — F411 Generalized anxiety disorder: Secondary | ICD-10-CM

## 2023-08-29 ENCOUNTER — Other Ambulatory Visit: Payer: Self-pay | Admitting: *Deleted

## 2023-08-29 MED ORDER — PREDNISONE 1 MG PO TABS: 1.0000 mg | ORAL_TABLET | Freq: Every day | ORAL | 0 refills | Status: DC

## 2023-08-29 NOTE — Telephone Encounter (Signed)
[facility]  for [facility]   Last Fill: 07/29/2023   Next Visit: 10/15/2023   Last Visit: 07/12/2023   Dx: Polymyalgia rheumatica    Current Dose per office note on 07/12/2023: tapering prednisone by 1 mg every month and is currently on 4 mg daily and will be reducing to 3 mg daily starting 07/13/2023  Patient states she is currently on Prednisone 2 mg and will be decreasing to 1 mg 09/16/2022    Okay to refill Prednisone?

## 2023-09-13 ENCOUNTER — Other Ambulatory Visit: Payer: Self-pay | Admitting: Physician Assistant

## 2023-09-13 NOTE — Telephone Encounter (Signed)
Last Fill: 06/27/2023  Labs: 06/25/2023 CBC and CMP WNL  Next Visit: 10/15/2023  Last Visit: 07/12/2023  DX: Polymyalgia rheumatica   Current Dose per office note 07/12/2023: Methotrexate 6 tablets by mouth once weekly   Okay to refill Methotrexate?

## 2023-09-18 ENCOUNTER — Other Ambulatory Visit: Payer: Self-pay | Admitting: Family Medicine

## 2023-09-20 ENCOUNTER — Other Ambulatory Visit: Payer: Self-pay | Admitting: *Deleted

## 2023-09-20 NOTE — Telephone Encounter (Signed)
Patient contacted the office requesting a refill on Prednisone. Patient states she will be on 1 mg until the end of the month.   Last Fill: 08/29/2023  Next Visit: 10/15/2023  Last Visit: 07/12/2023  Dx:  Polymyalgia rheumatica   Current Dose per office note on 07/12/2023: tapering prednisone by 1 mg every month and is currently on 4 mg daily and will be reducing to 3 mg daily starting 07/13/2023   Okay to refill Prednisone?

## 2023-09-23 MED ORDER — PREDNISONE 1 MG PO TABS: 1.0000 mg | ORAL_TABLET | Freq: Every day | ORAL | 0 refills | Status: DC
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
## 2023-10-04 ENCOUNTER — Telehealth: Payer: Self-pay | Admitting: Family Medicine

## 2023-10-04 NOTE — Telephone Encounter (Signed)
Patient called she is requesting labs for Complete Metabolic and CBC with Differential, Lipid Panel Hemoglobin A1C,  patient would like come in next week to get these done

## 2023-10-08 ENCOUNTER — Telehealth: Payer: Self-pay | Admitting: Family Medicine

## 2023-10-08 ENCOUNTER — Other Ambulatory Visit: Payer: Self-pay | Admitting: *Deleted

## 2023-10-08 DIAGNOSIS — Z79899 Other long term (current) drug therapy: Secondary | ICD-10-CM

## 2023-10-08 NOTE — Telephone Encounter (Signed)
Patient called again checking on standing lab orders she is requesting a call back when they're in the system

## 2023-10-09 ENCOUNTER — Other Ambulatory Visit: Payer: Self-pay | Admitting: *Deleted

## 2023-10-09 DIAGNOSIS — Z7952 Long term (current) use of systemic steroids: Secondary | ICD-10-CM

## 2023-10-09 DIAGNOSIS — Z79899 Other long term (current) drug therapy: Secondary | ICD-10-CM | POA: Diagnosis not present

## 2023-10-09 DIAGNOSIS — E7800 Pure hypercholesterolemia, unspecified: Secondary | ICD-10-CM | POA: Diagnosis not present

## 2023-10-10 ENCOUNTER — Telehealth: Payer: Self-pay | Admitting: Rheumatology

## 2023-10-10 ENCOUNTER — Other Ambulatory Visit: Payer: Self-pay

## 2023-10-10 DIAGNOSIS — E7800 Pure hypercholesterolemia, unspecified: Secondary | ICD-10-CM

## 2023-10-10 DIAGNOSIS — I10 Essential (primary) hypertension: Secondary | ICD-10-CM

## 2023-10-10 DIAGNOSIS — Z79899 Other long term (current) drug therapy: Secondary | ICD-10-CM

## 2023-10-10 LAB — CMP14+EGFR
ALT: 28 [IU]/L (ref 0–32)
AST: 36 [IU]/L (ref 0–40)
Albumin: 4.3 g/dL (ref 3.8–4.8)
Alkaline Phosphatase: 51 [IU]/L (ref 44–121)
BUN/Creatinine Ratio: 12 (ref 12–28)
BUN: 12 mg/dL (ref 8–27)
Bilirubin Total: 0.5 mg/dL (ref 0.0–1.2)
CO2: 23 mmol/L (ref 20–29)
Calcium: 9.1 mg/dL (ref 8.7–10.3)
Chloride: 101 mmol/L (ref 96–106)
Creatinine, Ser: 0.99 mg/dL (ref 0.57–1.00)
Globulin, Total: 2.1 g/dL (ref 1.5–4.5)
Glucose: 80 mg/dL (ref 70–99)
Potassium: 4.6 mmol/L (ref 3.5–5.2)
Sodium: 138 mmol/L (ref 134–144)
Total Protein: 6.4 g/dL (ref 6.0–8.5)
eGFR: 60 mL/min/{1.73_m2} (ref >59)

## 2023-10-10 LAB — CBC WITH DIFFERENTIAL/PLATELET
Basophils Absolute: 0.1 10*3/uL (ref 0.0–0.2)
Basos: 1 %
EOS (ABSOLUTE): 0 10*3/uL (ref 0.0–0.4)
Eos: 1 %
Hematocrit: 37.9 % (ref 34.0–46.6)
Hemoglobin: 12.7 g/dL (ref 11.1–15.9)
Immature Grans (Abs): 0 10*3/uL (ref 0.0–0.1)
Immature Granulocytes: 0 %
Lymphocytes Absolute: 1.2 10*3/uL (ref 0.7–3.1)
Lymphs: 33 %
MCH: 32.6 pg (ref 26.6–33.0)
MCHC: 33.5 g/dL (ref 31.5–35.7)
MCV: 97 fL (ref 79–97)
Monocytes Absolute: 0.3 10*3/uL (ref 0.1–0.9)
Monocytes: 7 %
Neutrophils Absolute: 2.1 10*3/uL (ref 1.4–7.0)
Neutrophils: 58 %
Platelets: 228 10*3/uL (ref 150–450)
RBC: 3.9 x10E6/uL (ref 3.77–5.28)
RDW: 13.1 % (ref 11.7–15.4)
WBC: 3.7 10*3/uL (ref 3.4–10.8)

## 2023-10-10 NOTE — Telephone Encounter (Signed)
Patient contacted the office requesting lab orders to be be release to [facility] at [facility] Fax number [phone] please put Sigmund Hazel)  Patient plans to have labs tomorrow

## 2023-10-10 NOTE — Telephone Encounter (Signed)
Spoke with patient . She came in for lab draw yesterday - CBC and CMP was drawn- she states she thought she was getting some standing lab orders that were ordered from rheumatology Called Rheumatologist , Sherron Ales, PA - spoke with Archie Patten who will have the nurse Sue Lush fax these orders for [place] to see if patient needing any additional testing .

## 2023-10-10 NOTE — Telephone Encounter (Signed)
Please see note from 1018 if we can get those entered, and then notify the patient.

## 2023-10-10 NOTE — Progress Notes (Signed)
Your lab work is within acceptable range and there are no concerning findings.   ?

## 2023-10-10 NOTE — Progress Notes (Signed)
CBC and CMP WNL

## 2023-10-10 NOTE — Telephone Encounter (Signed)
A1c and lipid added on to previous labs

## 2023-10-11 NOTE — Telephone Encounter (Signed)
Patient advised she is not due to update labs at this time.

## 2023-10-12 LAB — LIPID PANEL WITH LDL/HDL RATIO
Cholesterol, Total: 168 mg/dL (ref 100–199)
HDL: 53 mg/dL (ref >39)
LDL Chol Calc (NIH): 96 mg/dL (ref 0–99)
LDL/HDL Ratio: 1.8 ratio (ref 0.0–3.2)
Triglycerides: 102 mg/dL (ref 0–149)
VLDL Cholesterol Cal: 19 mg/dL (ref 5–40)

## 2023-10-12 LAB — SPECIMEN STATUS REPORT

## 2023-10-12 LAB — HGB A1C W/O EAG: Hgb A1c MFr Bld: 5.5 % (ref 4.8–5.6)

## 2023-10-14 NOTE — Progress Notes (Signed)
Hi Deborah Mccullough, total cholesterol and LDL look good.  A1c also looks great at 5.5.

## 2023-10-15 ENCOUNTER — Ambulatory Visit: Payer: Medicare HMO | Attending: Rheumatology | Admitting: Rheumatology

## 2023-10-15 ENCOUNTER — Encounter: Payer: Self-pay | Admitting: Rheumatology

## 2023-10-15 VITALS — BP 164/75 | HR 54 | Resp 14 | Ht 62.0 in | Wt 149.0 lb

## 2023-10-15 DIAGNOSIS — Z7952 Long term (current) use of systemic steroids: Secondary | ICD-10-CM | POA: Diagnosis not present

## 2023-10-15 DIAGNOSIS — M19041 Primary osteoarthritis, right hand: Secondary | ICD-10-CM | POA: Diagnosis not present

## 2023-10-15 DIAGNOSIS — I10 Essential (primary) hypertension: Secondary | ICD-10-CM

## 2023-10-15 DIAGNOSIS — M19032 Primary osteoarthritis, left wrist: Secondary | ICD-10-CM

## 2023-10-15 DIAGNOSIS — Z8669 Personal history of other diseases of the nervous system and sense organs: Secondary | ICD-10-CM

## 2023-10-15 DIAGNOSIS — M25511 Pain in right shoulder: Secondary | ICD-10-CM | POA: Diagnosis not present

## 2023-10-15 DIAGNOSIS — R5383 Other fatigue: Secondary | ICD-10-CM | POA: Diagnosis not present

## 2023-10-15 DIAGNOSIS — M810 Age-related osteoporosis without current pathological fracture: Secondary | ICD-10-CM

## 2023-10-15 DIAGNOSIS — Z79899 Other long term (current) drug therapy: Secondary | ICD-10-CM

## 2023-10-15 DIAGNOSIS — M25512 Pain in left shoulder: Secondary | ICD-10-CM

## 2023-10-15 DIAGNOSIS — U071 COVID-19: Secondary | ICD-10-CM

## 2023-10-15 DIAGNOSIS — F419 Anxiety disorder, unspecified: Secondary | ICD-10-CM

## 2023-10-15 DIAGNOSIS — M25551 Pain in right hip: Secondary | ICD-10-CM | POA: Diagnosis not present

## 2023-10-15 DIAGNOSIS — M19042 Primary osteoarthritis, left hand: Secondary | ICD-10-CM

## 2023-10-15 DIAGNOSIS — M3509 Sicca syndrome with other organ involvement: Secondary | ICD-10-CM | POA: Diagnosis not present

## 2023-10-15 DIAGNOSIS — M353 Polymyalgia rheumatica: Secondary | ICD-10-CM | POA: Diagnosis not present

## 2023-10-15 DIAGNOSIS — R7982 Elevated C-reactive protein (CRP): Secondary | ICD-10-CM

## 2023-10-15 DIAGNOSIS — E7800 Pure hypercholesterolemia, unspecified: Secondary | ICD-10-CM

## 2023-10-15 DIAGNOSIS — M19012 Primary osteoarthritis, left shoulder: Secondary | ICD-10-CM | POA: Diagnosis not present

## 2023-10-15 DIAGNOSIS — F32A Depression, unspecified: Secondary | ICD-10-CM

## 2023-10-15 DIAGNOSIS — M1712 Unilateral primary osteoarthritis, left knee: Secondary | ICD-10-CM | POA: Diagnosis not present

## 2023-10-15 DIAGNOSIS — R768 Other specified abnormal immunological findings in serum: Secondary | ICD-10-CM

## 2023-10-15 DIAGNOSIS — M25552 Pain in left hip: Secondary | ICD-10-CM

## 2023-10-15 DIAGNOSIS — G8929 Other chronic pain: Secondary | ICD-10-CM

## 2023-10-15 DIAGNOSIS — F5101 Primary insomnia: Secondary | ICD-10-CM

## 2023-10-15 NOTE — Patient Instructions (Signed)
Standing Labs We placed an order today for your standing lab work.   Please have your standing labs drawn in January and every 3 months  Please have your labs drawn 2 weeks prior to your appointment so that the provider can discuss your lab results at your appointment, if possible.  Please note that you may see your imaging and lab results in MyChart before we have reviewed them. We will contact you once all results are reviewed. Please allow our office up to 72 hours to thoroughly review all of the results before contacting the office for clarification of your results.  WALK-IN LAB HOURS  Monday through Thursday from 8:00 am -12:30 pm and 1:00 pm-5:00 pm and Friday from 8:00 am-12:00 pm.  Patients with office visits requiring labs will be seen before walk-in labs.  You may encounter longer than normal wait times. Please allow additional time. Wait times may be shorter on  Monday and Thursday afternoons.  We do not book appointments for walk-in labs. We appreciate your patience and understanding with our staff.   Labs are drawn by [facility]. Please bring your co-pay at the time of your lab draw.  You may receive a bill from [facility] for your lab work.  Please note if you are on Hydroxychloroquine and and an order has been placed for a Hydroxychloroquine level,  you will need to have it drawn 4 hours or more after your last dose.  If you wish to have your labs drawn at another location, please call the office 24 hours in advance so we can fax the orders.  The office is located at [address], [place], [place] [address]   If you have any questions regarding directions or hours of operation,  please call [phone].   As a reminder, please drink plenty of water prior to coming for your lab work. Thanks!   Vaccines You are taking a medication(s) that can suppress your immune system.  The following immunizations are recommended: Flu annually Covid-19  RSV Td/Tdap (tetanus,  diphtheria, pertussis) every 10 years Pneumonia (Prevnar 15 then Pneumovax 23 at least 1 year apart.  Alternatively, can take Prevnar 20 without needing additional dose) Shingrix: 2 doses from 4 weeks to 6 months apart  Please check with your PCP to make sure you are up to date.   If you have signs or symptoms of an infection or start antibiotics: First, call your PCP for workup of your infection. Hold your medication through the infection, until you complete your antibiotics, and until symptoms resolve if you take the following: Injectable medication (Actemra, Benlysta, Cimzia, Cosentyx, Enbrel, Humira, Kevzara, Orencia, Remicade, Simponi, Stelara, Taltz, Tremfya) Methotrexate Leflunomide (Arava) Mycophenolate (Cellcept) Harriette Ohara, Olumiant, or Rinvoq

## 2023-10-23 ENCOUNTER — Encounter: Payer: Self-pay | Admitting: Family Medicine

## 2023-10-23 ENCOUNTER — Ambulatory Visit (INDEPENDENT_AMBULATORY_CARE_PROVIDER_SITE_OTHER): Payer: Medicare HMO | Admitting: Family Medicine

## 2023-10-23 VITALS — BP 125/57 | HR 57 | Ht 62.0 in | Wt 148.0 lb

## 2023-10-23 DIAGNOSIS — L918 Other hypertrophic disorders of the skin: Secondary | ICD-10-CM

## 2023-10-23 DIAGNOSIS — H6122 Impacted cerumen, left ear: Secondary | ICD-10-CM

## 2023-10-23 DIAGNOSIS — L821 Other seborrheic keratosis: Secondary | ICD-10-CM

## 2023-10-23 NOTE — Patient Instructions (Signed)
Once your skin peels for the ones that we froze you can start applying Vaseline twice a day to moisturize the area and improve healing.  For the skin tag removals okay to get wet in the shower just avoid scrubbing at that area you can just use your fingertips to wash with soap and pat dry.  You can apply a little bit of Vaseline to the areas where we took those off daily for about 5 to 6 days.

## 2023-10-23 NOTE — Progress Notes (Signed)
Established Patient Office Visit  Subjective   Patient ID: Deborah Mccullough, female    DOB: [birth year]  Age: 72 y.o. MRN: [id number]  Chief Complaint  Patient presents with   Cerumen Impaction    HPI  She is here today for reasons her ears are feeling blocked and she is not been hearing well she feels like there is wax buildup and would like to have it removed.  So has scheduled to have several skin tags removed from around her neck.they get very irritated from her clothing/collar  She also has several seborrheic keratoses on her anterior chest wall just below her breasts and 1 on her left anterior thigh crease. They get very irritated from her bra      Cryotherapy Procedure Note  Pre-operative Diagnosis: Seborrheic keratoses.  Post-operative Diagnosis: same  Locations: anteriro chest wall and left thigh crease   Indications: irritation from bra   Anesthesia: none  Procedure Details: Cryotherapy performed of multiple lesions on the chest wall and 1 on the left anterior thigh crease.  Patient tolerated well.   Skin Tag Removal Procedure Note Diagnosis: inflamed skin tags Location: neck Informed Consent: Discussed risks (permanent scarring, infection, pain, bleeding, bruising, redness, and recurrence of the lesion) and benefits of the procedure, as well as the alternatives. She is aware that skin tags are benign lesions, and their removal is often not considered medically necessary. Informed consent was obtained. Preparation: The area was prepared in a standard fashion. Anesthesia: not required Procedure Details: Iris scissors were used to perform sharp removal. Aluminum chloride was applied for hemostasis. Ointment and bandage were applied where needed. The patient tolerated the procedure well. Total number of lesions treated: 10 Plan: The patient was instructed on post-op care. Recommend OTC analgesia as needed for pain.  Patient informed of risks (permanent scarring,  infection, light or dark discoloration, bleeding, infection, weakness, numbness and recurrence of the lesion) and benefits of the procedure and verbal informed consent obtained.  The areas are treated with liquid nitrogen therapy, frozen until ice ball extended 1-2 mm beyond lesion, allowed to thaw, and treated again. The patient tolerated procedure well.  The patient was instructed on post-op care, warned that there may be blister formation, redness and pain. Recommend OTC analgesia as needed for pain.  Condition: Stable  Complications: none.  Plan: 1. Instructed to keep the area dry and covered for 24-48h and clean thereafter. 2. Warning signs of infection were reviewed.   3. Recommended that the patient use OTC acetaminophen as needed for pain.  4. Return PRN.    ROS    Objective:     BP (!) 125/57   Pulse (!) 57   Ht 5\' 2"  (1.575 m)   Wt 148 lb (67.1 kg)   SpO2 97%   BMI 27.07 kg/m     Physical Exam HENT:     Head:     Comments: Your cerumen removal the right TM looks great just a little bit of residual wax in the canal but nothing obstructing the view of the TM.  There is still a little bit of dead skin overlying the TM on the left side but most of the wax has been removed.     No results found for any visits on 10/23/23.     The 10-year ASCVD risk score (Arnett DK, et al., 2019) is: 16%    Assessment & Plan:   Problem List Items Addressed This Visit   None Visit Diagnoses  Seborrheic keratoses    -  Primary   Inflamed skin tag       Impacted cerumen of left ear          Tolerated procedures well.  Okay to return for additional lesions we were not able to do them all today because of discomfort.  Follow-up wound care discussed.  Indication: Cerumen impaction of the ear(s) Medical necessity statement: On physical examination, cerumen impairs clinically significant portions of the external auditory canal, and tympanic membrane. Noted obstructive,  copious cerumen that cannot be removed without magnification and instrumentations  Consent: Discussed benefits and risks of procedure and verbal consent obtained Procedure: Patient was prepped for the procedure. Utilized an otoscope to assess and take note of the ear canal, the tympanic membrane, and the presence, amount, and placement of the cerumen. Gentle water irrigation and soft plastic curette was utilized to remove cerumen.  Post procedure examination: shows cerumen was completely removed. Patient tolerated procedure well. The patient is made aware that they may experience temporary vertigo, temporary hearing loss, and temporary discomfort. If these symptom last for more than 24 hours to call the clinic or proceed to the ED.    Return if symptoms worsen or fail to improve.    Nani Gasser, MD

## 2023-11-05 ENCOUNTER — Other Ambulatory Visit: Payer: Self-pay | Admitting: Family Medicine

## 2023-11-18 IMAGING — MG MM DIGITAL SCREENING BILAT W/ TOMO AND CAD
6 of 10 series · 6 of 30 positions shown · non-contrast
Comparison: Previous exam(s).

CLINICAL DATA: Screening.

EXAM:
DIGITAL SCREENING BILATERAL MAMMOGRAM WITH TOMOSYNTHESIS AND CAD
TECHNIQUE: Bilateral screening digital craniocaudal and mediolateral oblique
mammograms were obtained. Bilateral screening digital breast
tomosynthesis was performed. The images were evaluated with
computer-aided detection.

[R CC synth-2D]
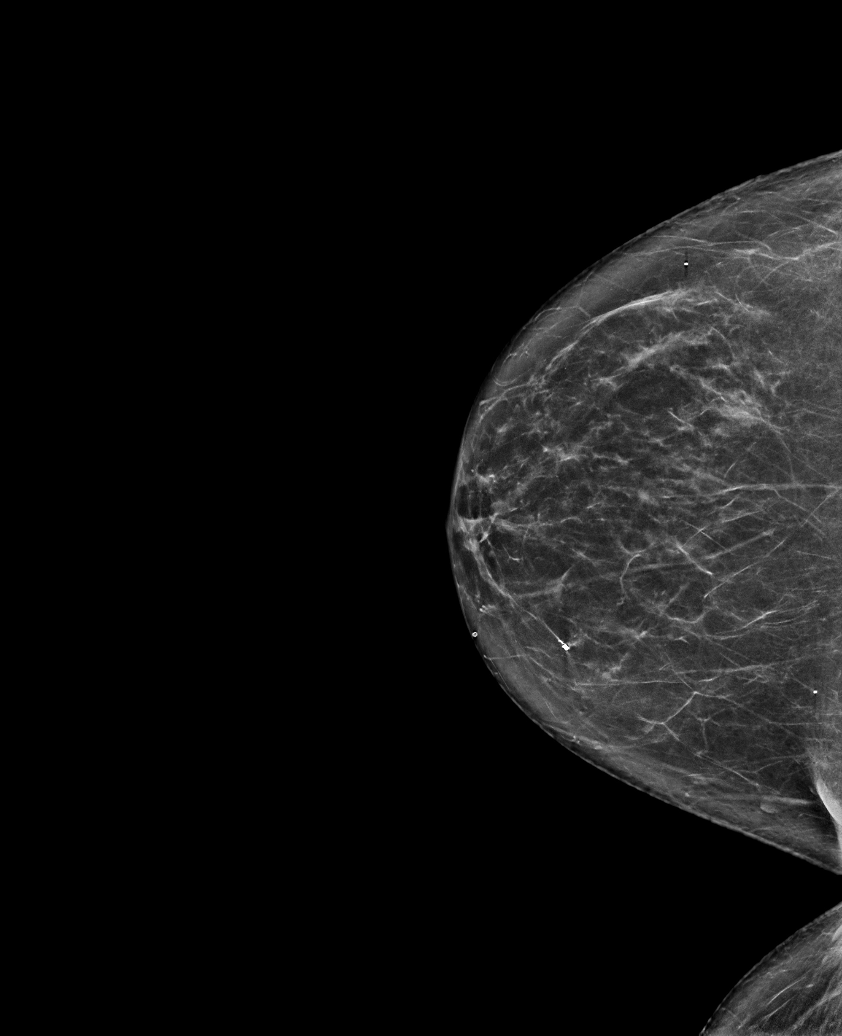

[L CC synth-2D]
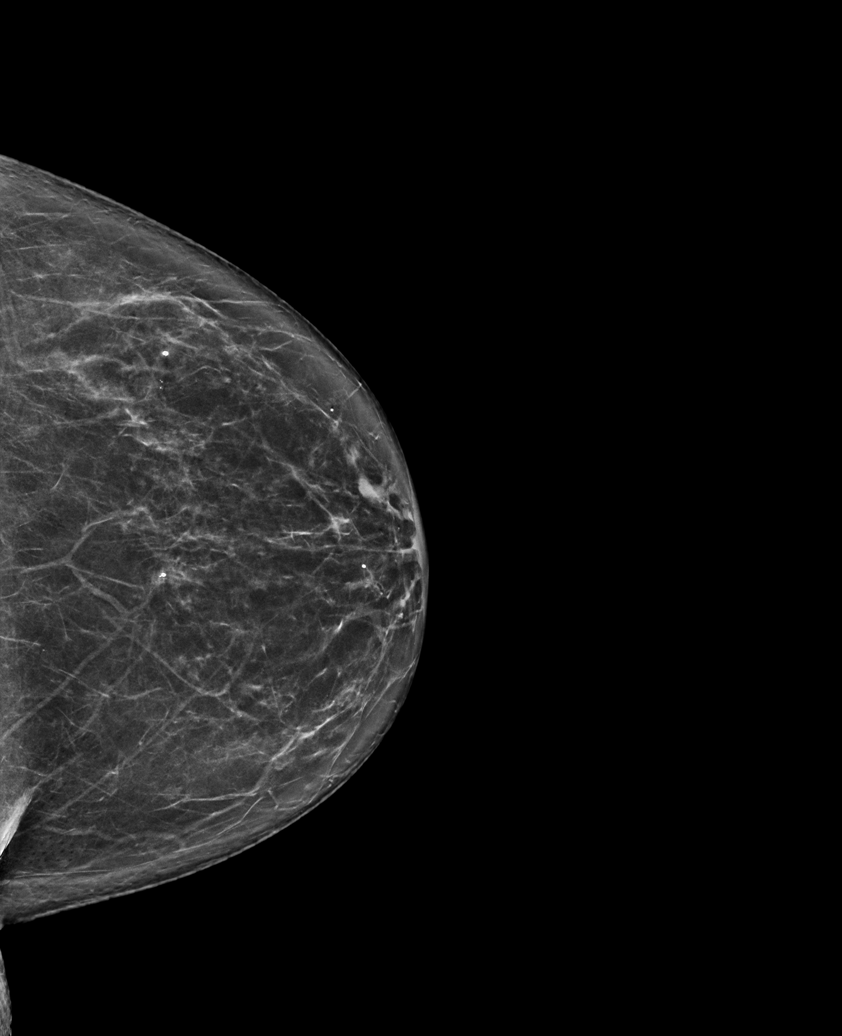

[R MLO synth-2D (1 of 2)]
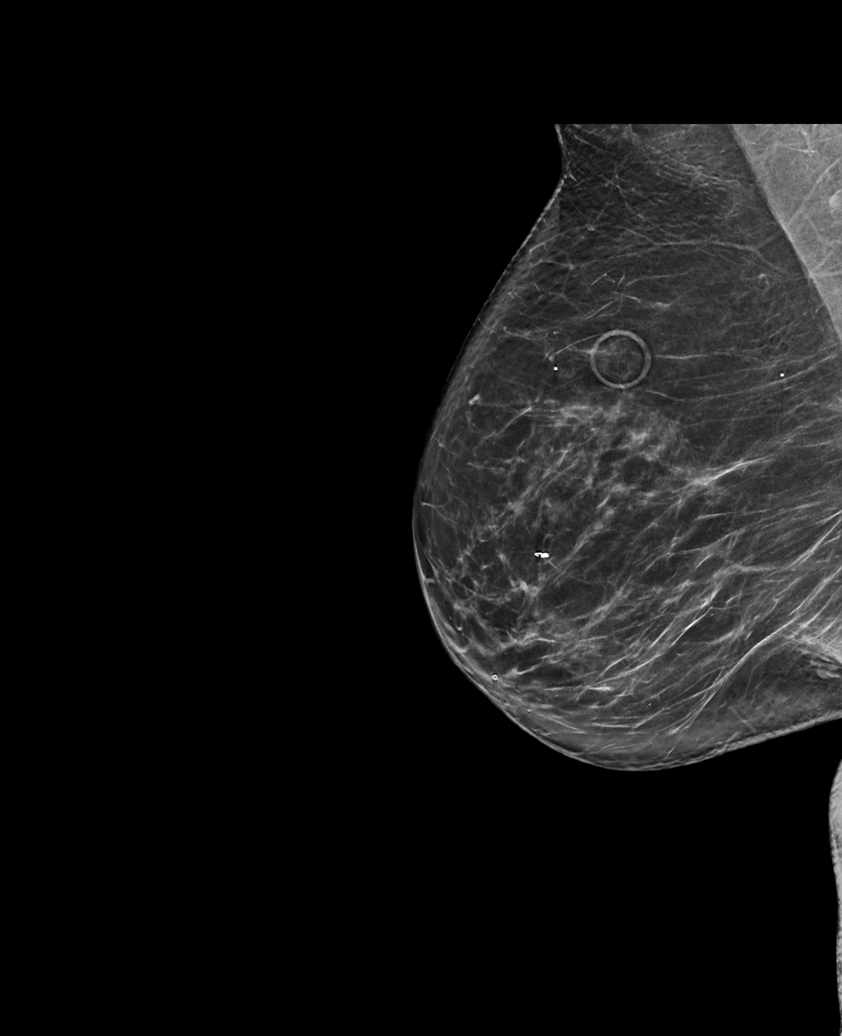

[R MLO synth-2D (2 of 2)]
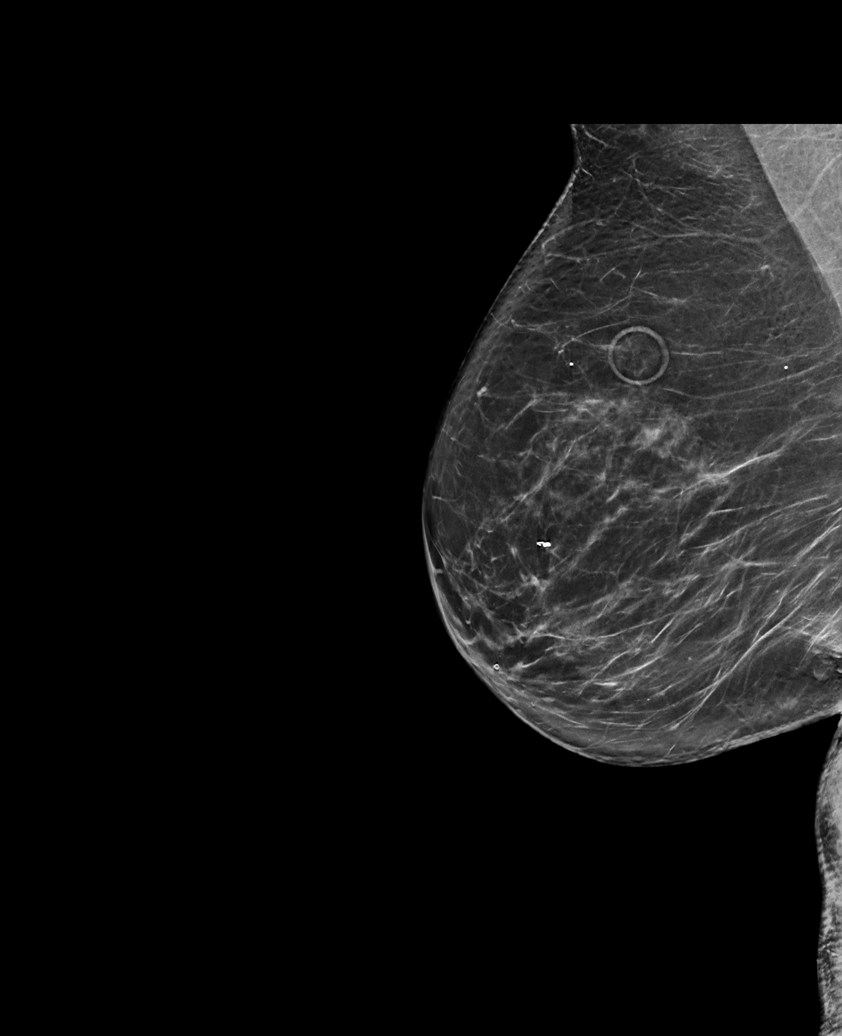

[L MLO synth-2D]
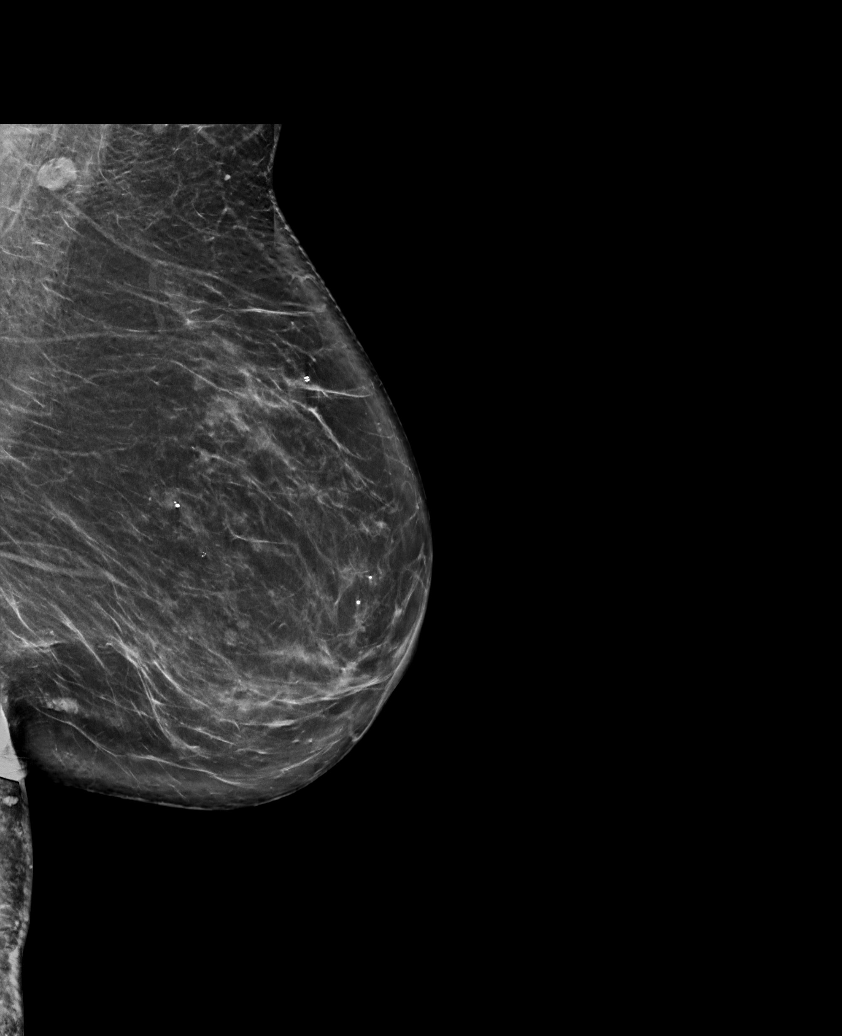

[L MLO tomo · tomo slice 44/87.0]
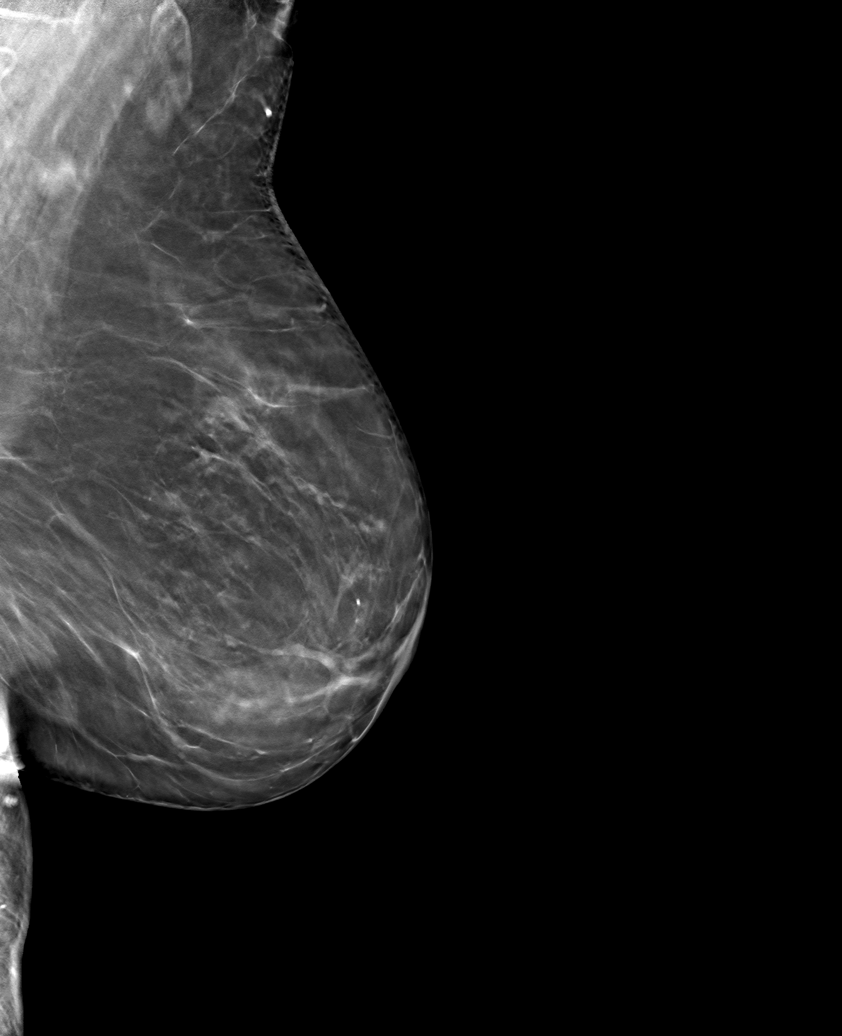

[6 of 30 positions shown; findings below may reference images not displayed]

ACR Breast Density Category b: There are scattered areas of
fibroglandular density.
FINDINGS: There are no findings suspicious for malignancy.
IMPRESSION: No mammographic evidence of malignancy. A result letter of this
screening mammogram will be mailed directly to the patient.

RECOMMENDATION:
Screening mammogram in one year. (Code:51-O-LD2)

BI-RADS CATEGORY  1: Negative.

## 2023-11-21 ENCOUNTER — Other Ambulatory Visit: Payer: Self-pay | Admitting: Family Medicine

## 2023-11-21 ENCOUNTER — Ambulatory Visit
Admission: RE | Admit: 2023-11-21 | Discharge: 2023-11-21 | Disposition: A | Discharge status: Home / Self Care | Payer: Medicare HMO | Source: Ambulatory Visit | Attending: Family Medicine | Admitting: Family Medicine

## 2023-11-21 DIAGNOSIS — Z1231 Encounter for screening mammogram for malignant neoplasm of breast: Secondary | ICD-10-CM

## 2023-11-21 DIAGNOSIS — F411 Generalized anxiety disorder: Secondary | ICD-10-CM

## 2023-11-22 NOTE — Progress Notes (Signed)
Please call patient. Normal mammogram.  Repeat in 1 year.  

## 2023-11-27 ENCOUNTER — Other Ambulatory Visit: Payer: Self-pay | Admitting: Physician Assistant

## 2023-11-27 NOTE — Telephone Encounter (Signed)
Last Fill: 11/27/2022  Next Visit: 01/15/2024  Last Visit: 10/29/202  Dx: Polymyalgia rheumatica   Current Dose per office note on 10/15/2023: folic acid 2 mg daily   Okay to refill Folic Acid?

## 2023-12-06 NOTE — Telephone Encounter (Signed)
A prescription has been sent

## 2023-12-06 NOTE — Telephone Encounter (Signed)
Copied from CRM [id number]. Topic: Clinical - Medication Refill >> Dec 06, 2023 10:15 AM Clayton Bibles wrote: Most Recent Primary Care Visit:  Provider: Nani Gasser D  Department: [facility]  Visit Type: OFFICE VISIT  Date: 10/23/2023  Medication: escitalopram (LEXAPRO) 10 MG  Has the patient contacted their pharmacy? Yes (Agent: If no, request that the patient contact the pharmacy for the refill. If patient does not wish to contact the pharmacy document the reason why and proceed with request.) (Agent: If yes, when and what did the pharmacy advise?)  Is this the correct pharmacy for this prescription? Yes If no, delete pharmacy and type the correct one.  This is the patient's preferred pharmacy:  [facility] [id number] - [place], [place] - [address] [place] [address] Phone: [phone] Fax: [phone]   Has the prescription been filled recently? No  Is the patient out of the medication? She has 6 pills left  Has the patient been seen for an appointment in the last year OR does the patient have an upcoming appointment? Yes  Can we respond through MyChart? Yes and call if needed and leave message  Agent: Please be advised that Rx refills may take up to 3 business days. We ask that you follow-up with your pharmacy.

## 2023-12-12 ENCOUNTER — Other Ambulatory Visit: Payer: Self-pay | Admitting: Family Medicine

## 2023-12-19 ENCOUNTER — Other Ambulatory Visit: Payer: Self-pay

## 2023-12-19 MED ORDER — METOPROLOL SUCCINATE ER 100 MG PO TB24: 100.0000 mg | ORAL_TABLET | Freq: Every day | ORAL | 1 refills | Status: DC

## 2023-12-19 NOTE — Telephone Encounter (Signed)
 Copied from CRM [id number]. Topic: Clinical - Medication Question >> Dec 17, 2023  4:43 PM Deleta HERO wrote: Reason for CRM: The patient wanted to make sure the paperwork sent over by her pharmacy [facility]lo[facility] Prior has been received for refill of metoprolol  succinate (TOPROL -XL) 100 MG 24 hr tablet.

## 2023-12-20 ENCOUNTER — Other Ambulatory Visit: Payer: Self-pay | Admitting: Family Medicine

## 2023-12-20 DIAGNOSIS — F411 Generalized anxiety disorder: Secondary | ICD-10-CM

## 2023-12-26 ENCOUNTER — Ambulatory Visit (INDEPENDENT_AMBULATORY_CARE_PROVIDER_SITE_OTHER): Payer: Medicare HMO | Admitting: Family Medicine

## 2023-12-26 VITALS — BP 128/62 | HR 58 | Ht 62.0 in | Wt 144.0 lb

## 2023-12-26 DIAGNOSIS — Z8669 Personal history of other diseases of the nervous system and sense organs: Secondary | ICD-10-CM | POA: Diagnosis not present

## 2023-12-26 DIAGNOSIS — F339 Major depressive disorder, recurrent, unspecified: Secondary | ICD-10-CM | POA: Diagnosis not present

## 2023-12-26 DIAGNOSIS — F411 Generalized anxiety disorder: Secondary | ICD-10-CM

## 2023-12-26 DIAGNOSIS — Z79899 Other long term (current) drug therapy: Secondary | ICD-10-CM | POA: Diagnosis not present

## 2023-12-26 DIAGNOSIS — F5101 Primary insomnia: Secondary | ICD-10-CM

## 2023-12-26 DIAGNOSIS — M353 Polymyalgia rheumatica: Secondary | ICD-10-CM | POA: Diagnosis not present

## 2023-12-26 DIAGNOSIS — I10 Essential (primary) hypertension: Secondary | ICD-10-CM

## 2023-12-26 NOTE — Assessment & Plan Note (Signed)
 Well controlled. Continue current regimen. Follow up in  6 mo

## 2023-12-26 NOTE — Assessment & Plan Note (Signed)
 Follows with Dr. Beatrix Shipper

## 2023-12-26 NOTE — Assessment & Plan Note (Signed)
 Requested we change to history of as hasn't had HA in long time.

## 2023-12-26 NOTE — Progress Notes (Signed)
 Established Patient Office Visit  Subjective  Patient ID: Deborah Mccullough, female    DOB: 05-Jul-1951  Age: 73 y.o. MRN: [id number]  Chief Complaint  Patient presents with   Hypertension    HPI  Hypertension- Pt denies chest pain, SOB, dizziness, or heart palpitations.  Taking meds as directed w/o problems.  Denies medication side effects.    Migraines -that she can he remember the last time she had a migraine.  Insomnia -she is takes half of a tab of the Ambien  and that seems to work well and she does not feel like it is overly sedating or causing any problems in the morning.  She does feel more chronically fatigued but she really feels like that is the methotrexate  she says it really has made a difference with her joint aches and pains but it does make her feel tired.  Labs are UTD.   Noticed a knot on her right outer ankle that she wanted me to take a look at today it is not painful or bothersome.  Also had a question about probiotics.  She said the room allergist mention to avoid certain probiotics but she was not sure which ones.     ROS    Objective:     BP 128/62   Pulse (!) 58   Ht 5' 2 (1.575 m)   Wt 144 lb (65.3 kg)   SpO2 96%   BMI 26.34 kg/m    Physical Exam Vitals and nursing note reviewed.  Constitutional:      Appearance: Normal appearance.  HENT:     Head: Normocephalic and atraumatic.  Eyes:     Conjunctiva/sclera: Conjunctivae normal.  Cardiovascular:     Rate and Rhythm: Normal rate and regular rhythm.  Pulmonary:     Effort: Pulmonary effort is normal.     Breath sounds: Normal breath sounds.  Skin:    General: Skin is warm and dry.  Neurological:     Mental Status: She is alert.  Psychiatric:        Mood and Affect: Mood normal.      No results found for any visits on 12/26/23.    The 10-year ASCVD risk score (Arnett DK, et al., 2019) is: 18.6%    Assessment & Plan:   Problem List Items Addressed This Visit        Cardiovascular and Mediastinum   Essential hypertension, benign - Primary   Well controlled. Continue current regimen. Follow up in  6 mo         Other   Primary insomnia   Really well with Ambien  2.5 mg.      PMR (polymyalgia rheumatica) (HCC)   Follows with Dr. Rogelia      History of migraine headaches   Requested we change to history of as hasn't had HA in long time.       GAD (generalized anxiety disorder)   See Note below under depression.      Depression, recurrent (HCC)   Happy with her Lexapro .  No specific concerns she would like to continue with her current regimen.      Other Visit Diagnoses       Encounter for long-term (current) use of medications       Relevant Orders   CMP14+EGFR   CBC w/Diff/Platelet      Has what feels like a tiny synovial cyst on that right outer ankle.  Right now it is not bothersome so would not recommend any further  intervention or treatment if it becomes larger or tender then please let us  know.  Has an appointment upcoming with rheumatologist in a couple of weeks we will go ahead and get her labs drawn here today so she will have those for the appointment and also encouraged her to discuss with her provider about the probiotic issue I am not clear exactly what they are recommending or not recommending.  Return in about 4 months (around 04/24/2024) for ifg,htn.    Dorothyann Byars, MD

## 2023-12-26 NOTE — Assessment & Plan Note (Signed)
 See Note below under depression.

## 2023-12-26 NOTE — Assessment & Plan Note (Signed)
 Really well with Ambien 2.5 mg.

## 2023-12-26 NOTE — Assessment & Plan Note (Signed)
 Happy with her Lexapro.  No specific concerns she would like to continue with her current regimen.

## 2023-12-27 LAB — CBC WITH DIFFERENTIAL/PLATELET
Basophils Absolute: 0 10*3/uL (ref 0.0–0.2)
Basos: 1 %
EOS (ABSOLUTE): 0.1 10*3/uL (ref 0.0–0.4)
Eos: 2 %
Hematocrit: 37.6 % (ref 34.0–46.6)
Hemoglobin: 12.5 g/dL (ref 11.1–15.9)
Immature Grans (Abs): 0 10*3/uL (ref 0.0–0.1)
Immature Granulocytes: 0 %
Lymphocytes Absolute: 1.1 10*3/uL (ref 0.7–3.1)
Lymphs: 26 %
MCH: 31.3 pg (ref 26.6–33.0)
MCHC: 33.2 g/dL (ref 31.5–35.7)
MCV: 94 fL (ref 79–97)
Monocytes Absolute: 0.3 10*3/uL (ref 0.1–0.9)
Monocytes: 8 %
Neutrophils Absolute: 2.6 10*3/uL (ref 1.4–7.0)
Neutrophils: 63 %
Platelets: 210 10*3/uL (ref 150–450)
RBC: 4 x10E6/uL (ref 3.77–5.28)
RDW: 13 % (ref 11.7–15.4)
WBC: 4.1 10*3/uL (ref 3.4–10.8)

## 2023-12-27 LAB — CMP14+EGFR
ALT: 27 [IU]/L (ref 0–32)
AST: 29 [IU]/L (ref 0–40)
Albumin: 4 g/dL (ref 3.8–4.8)
Alkaline Phosphatase: 54 [IU]/L (ref 44–121)
BUN/Creatinine Ratio: 19 (ref 12–28)
BUN: 15 mg/dL (ref 8–27)
Bilirubin Total: 0.4 mg/dL (ref 0.0–1.2)
CO2: 23 mmol/L (ref 20–29)
Calcium: 8.8 mg/dL (ref 8.7–10.3)
Chloride: 106 mmol/L (ref 96–106)
Creatinine, Ser: 0.81 mg/dL (ref 0.57–1.00)
Globulin, Total: 1.7 g/dL (ref 1.5–4.5)
Glucose: 88 mg/dL (ref 70–99)
Potassium: 4.2 mmol/L (ref 3.5–5.2)
Sodium: 143 mmol/L (ref 134–144)
Total Protein: 5.7 g/dL — ABNORMAL LOW (ref 6.0–8.5)
eGFR: 76 mL/min/{1.73_m2} (ref >59)

## 2023-12-27 NOTE — Progress Notes (Signed)
 CBC and CMP are normal.

## 2023-12-28 ENCOUNTER — Other Ambulatory Visit: Payer: Self-pay | Admitting: Family Medicine
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
## 2024-01-08 ENCOUNTER — Other Ambulatory Visit: Payer: Self-pay | Admitting: Family Medicine

## 2024-01-08 DIAGNOSIS — F5101 Primary insomnia: Secondary | ICD-10-CM

## 2024-01-15 ENCOUNTER — Ambulatory Visit: Payer: Medicare HMO | Attending: Physician Assistant | Admitting: Physician Assistant

## 2024-01-15 ENCOUNTER — Encounter: Payer: Self-pay | Admitting: Physician Assistant

## 2024-01-15 VITALS — BP 120/76 | HR 56 | Ht 62.0 in | Wt 144.0 lb

## 2024-01-15 DIAGNOSIS — M19042 Primary osteoarthritis, left hand: Secondary | ICD-10-CM

## 2024-01-15 DIAGNOSIS — Z8669 Personal history of other diseases of the nervous system and sense organs: Secondary | ICD-10-CM

## 2024-01-15 DIAGNOSIS — M1712 Unilateral primary osteoarthritis, left knee: Secondary | ICD-10-CM

## 2024-01-15 DIAGNOSIS — M25511 Pain in right shoulder: Secondary | ICD-10-CM

## 2024-01-15 DIAGNOSIS — F419 Anxiety disorder, unspecified: Secondary | ICD-10-CM

## 2024-01-15 DIAGNOSIS — R7982 Elevated C-reactive protein (CRP): Secondary | ICD-10-CM

## 2024-01-15 DIAGNOSIS — M353 Polymyalgia rheumatica: Secondary | ICD-10-CM | POA: Diagnosis not present

## 2024-01-15 DIAGNOSIS — M810 Age-related osteoporosis without current pathological fracture: Secondary | ICD-10-CM

## 2024-01-15 DIAGNOSIS — M19041 Primary osteoarthritis, right hand: Secondary | ICD-10-CM | POA: Diagnosis not present

## 2024-01-15 DIAGNOSIS — M3509 Sicca syndrome with other organ involvement: Secondary | ICD-10-CM

## 2024-01-15 DIAGNOSIS — Z7952 Long term (current) use of systemic steroids: Secondary | ICD-10-CM

## 2024-01-15 DIAGNOSIS — M25551 Pain in right hip: Secondary | ICD-10-CM | POA: Diagnosis not present

## 2024-01-15 DIAGNOSIS — F5101 Primary insomnia: Secondary | ICD-10-CM

## 2024-01-15 DIAGNOSIS — M25552 Pain in left hip: Secondary | ICD-10-CM

## 2024-01-15 DIAGNOSIS — Z79899 Other long term (current) drug therapy: Secondary | ICD-10-CM | POA: Diagnosis not present

## 2024-01-15 DIAGNOSIS — I10 Essential (primary) hypertension: Secondary | ICD-10-CM

## 2024-01-15 DIAGNOSIS — R5383 Other fatigue: Secondary | ICD-10-CM | POA: Diagnosis not present

## 2024-01-15 DIAGNOSIS — M25512 Pain in left shoulder: Secondary | ICD-10-CM

## 2024-01-15 DIAGNOSIS — M19012 Primary osteoarthritis, left shoulder: Secondary | ICD-10-CM

## 2024-01-15 DIAGNOSIS — E7800 Pure hypercholesterolemia, unspecified: Secondary | ICD-10-CM

## 2024-01-15 DIAGNOSIS — F32A Depression, unspecified: Secondary | ICD-10-CM

## 2024-01-15 DIAGNOSIS — G8929 Other chronic pain: Secondary | ICD-10-CM

## 2024-01-15 NOTE — Patient Instructions (Addendum)
Standing Labs We placed an order today for your standing lab work.   Please have your standing labs drawn in April and every 3 months   Please have your labs drawn 2 weeks prior to your appointment so that the provider can discuss your lab results at your appointment, if possible.  Please note that you may see your imaging and lab results in MyChart before we have reviewed them. We will contact you once all results are reviewed. Please allow our office up to 72 hours to thoroughly review all of the results before contacting the office for clarification of your results.  WALK-IN LAB HOURS  Monday through Thursday from 8:00 am -12:30 pm and 1:00 pm-5:00 pm and Friday from 8:00 am-12:00 pm.  Patients with office visits requiring labs will be seen before walk-in labs.  You may encounter longer than normal wait times. Please allow additional time. Wait times may be shorter on  Monday and Thursday afternoons.  We do not book appointments for walk-in labs. We appreciate your patience and understanding with our staff.   Labs are drawn by [facility]. Please bring your co-pay at the time of your lab draw.  You may receive a bill from [facility] for your lab work.  Please note if you are on Hydroxychloroquine and and an order has been placed for a Hydroxychloroquine level,  you will need to have it drawn 4 hours or more after your last dose.  If you wish to have your labs drawn at another location, please call the office 24 hours in advance so we can fax the orders.  The office is located at [address], [place], [place] [address]   If you have any questions regarding directions or hours of operation,  please call [phone].   As a reminder, please drink plenty of water prior to coming for your lab work. Thanks!  Knee Exercises Ask your health care provider which exercises are safe for you. Do exercises exactly as told by your health care provider and adjust them as directed. It is normal  to feel mild stretching, pulling, tightness, or discomfort as you do these exercises. Stop right away if you feel sudden pain or your pain gets worse. Do not begin these exercises until told by your health care provider. Stretching and range-of-motion exercises These exercises warm up your muscles and joints and improve the movement and flexibility of your knee. These exercises also help to relieve pain and swelling. Knee extension, prone  Lie on your abdomen (prone position) on a bed. Place your left / right knee just beyond the edge of the surface so your knee is not on the bed. You can put a towel under your left / right thigh just above your kneecap for comfort. Relax your leg muscles and allow gravity to straighten your knee (extension). You should feel a stretch behind your left / right knee. Hold this position for __________ seconds. Scoot up so your knee is supported between repetitions. Repeat __________ times. Complete this exercise __________ times a day. Knee flexion, active  Lie on your back with both legs straight. If this causes back discomfort, bend your left / right knee so your foot is flat on the floor. Slowly slide your left / right heel back toward your buttocks. Stop when you feel a gentle stretch in the front of your knee or thigh (flexion). Hold this position for __________ seconds. Slowly slide your left / right heel back to the starting position. Repeat  __________ times. Complete this exercise __________ times a day. Quadriceps stretch, prone  Lie on your abdomen on a firm surface, such as a bed or padded floor. Bend your left / right knee and hold your ankle. If you cannot reach your ankle or pant leg, loop a belt around your foot and grab the belt instead. Gently pull your heel toward your buttocks. Your knee should not slide out to the side. You should feel a stretch in the front of your thigh and knee (quadriceps). Hold this position for __________  seconds. Repeat __________ times. Complete this exercise __________ times a day. Hamstring, supine  Lie on your back (supine position). Loop a belt or towel over the ball of your left / right foot. The ball of your foot is on the walking surface, right under your toes. Straighten your left / right knee and slowly pull on the belt to raise your leg until you feel a gentle stretch behind your knee (hamstring). Do not let your knee bend while you do this. Keep your other leg flat on the floor. Hold this position for __________ seconds. Repeat __________ times. Complete this exercise __________ times a day. Strengthening exercises These exercises build strength and endurance in your knee. Endurance is the ability to use your muscles for a long time, even after they get tired. Quadriceps, isometric This exercise strengthens the muscles in front of your thigh (quadriceps) without moving your knee joint (isometric). Lie on your back with your left / right leg extended and your other knee bent. Put a rolled towel or small pillow under your knee if told by your health care provider. Slowly tense the muscles in the front of your left / right thigh. You should see your kneecap slide up toward your hip or see increased dimpling just above the knee. This motion will push the back of the knee toward the floor. For __________ seconds, hold the muscle as tight as you can without increasing your pain. Relax the muscles slowly and completely. Repeat __________ times. Complete this exercise __________ times a day. Straight leg raises This exercise strengthens the muscles in front of your thigh (quadriceps) and the muscles that move your hips (hip flexors). Lie on your back with your left / right leg extended and your other knee bent. Tense the muscles in the front of your left / right thigh. You should see your kneecap slide up or see increased dimpling just above the knee. Your thigh may even shake a bit. Keep  these muscles tight as you raise your leg 4-6 inches (10-15 cm) off the floor. Do not let your knee bend. Hold this position for __________ seconds. Keep these muscles tense as you lower your leg. Relax your muscles slowly and completely after each repetition. Repeat __________ times. Complete this exercise __________ times a day. Hamstring, isometric  Lie on your back on a firm surface. Bend your left / right knee about __________ degrees. Dig your left / right heel into the surface as if you are trying to pull it toward your buttocks. Tighten the muscles in the back of your thighs (hamstring) to "dig" as hard as you can without increasing any pain. Hold this position for __________ seconds. Release the tension gradually and allow your muscles to relax completely for __________ seconds after each repetition. Repeat __________ times. Complete this exercise __________ times a day. Hamstring curls If told by your health care provider, do this exercise while wearing ankle weights. Begin with __________lb /  kg weights. Then increase the weight by 1 lb (0.5 kg) increments. Do not wear ankle weights that are more than __________lb / kg. Lie on your abdomen with your legs straight. Bend your left / right knee as far as you can without feeling pain. Keep your hips flat against the floor. Hold this position for __________ seconds. Slowly lower your leg to the starting position. Repeat __________ times. Complete this exercise __________ times a day. Squats This exercise strengthens the muscles in front of your thigh and knee (quadriceps). Stand in front of a table, with your feet and knees pointing straight ahead. You may rest your hands on the table for balance but not for support. Slowly bend your knees and lower your hips like you are going to sit in a chair. Keep your weight over your heels, not over your toes. Keep your lower legs upright so they are parallel with the table legs. Do not let your  hips go lower than your knees. Do not bend lower than told by your health care provider. If your knee pain increases, do not bend as low. Hold the squat position for __________ seconds. Slowly push with your legs to return to standing. Do not use your hands to pull yourself to standing. Repeat __________ times. Complete this exercise __________ times a day. Wall slides This exercise strengthens the muscles in front of your thigh and knee (quadriceps). Lean your back against a smooth wall or door, and walk your feet out 18-24 inches (46-61 cm) from it. Place your feet hip-width apart. Slowly slide down the wall or door until your knees bend __________ degrees. Keep your knees over your heels, not over your toes. Keep your knees in line with your hips. Hold this position for __________ seconds. Repeat __________ times. Complete this exercise __________ times a day. Straight leg raises, side-lying This exercise strengthens the muscles that rotate the leg at the hip and move it away from your body (hip abductors). Lie on your side with your left / right leg in the top position. Lie so your head, shoulder, knee, and hip line up. You may bend your bottom knee to help you keep your balance. Roll your hips slightly forward so your hips are stacked directly over each other and your left / right knee is facing forward. Leading with your heel, lift your top leg 4-6 inches (10-15 cm). You should feel the muscles in your outer hip lifting. Do not let your foot drift forward. Do not let your knee roll toward the ceiling. Hold this position for __________ seconds. Slowly return your leg to the starting position. Let your muscles relax completely after each repetition. Repeat __________ times. Complete this exercise __________ times a day. Straight leg raises, prone This exercise stretches the muscles that move your hips away from the front of the pelvis (hip extensors). Lie on your abdomen on a firm  surface. You can put a pillow under your hips if that is more comfortable. Tense the muscles in your buttocks and lift your left / right leg about 4-6 inches (10-15 cm). Keep your knee straight as you lift your leg. Hold this position for __________ seconds. Slowly lower your leg to the starting position. Let your leg relax completely after each repetition. Repeat __________ times. Complete this exercise __________ times a day. This information is not intended to replace advice given to you by your health care provider. Make sure you discuss any questions you have with your health care provider.  Document Revised: 08/15/2021 Document Reviewed: 08/15/2021 [employer] Patient Education  2024 [employer]   Hip Bursitis Rehab Ask your health care provider which exercises are safe for you. Do exercises exactly as told by your health care provider and adjust them as directed. It is normal to feel mild stretching, pulling, tightness, or discomfort as you do these exercises. Stop right away if you feel sudden pain or your pain gets worse. Do not begin these exercises until told by your health care provider. Stretching exercise This exercise warms up your muscles and joints and improves the movement and flexibility of your hip. This exercise also helps to relieve pain and stiffness. Iliotibial band stretch An iliotibial band is a strong band of muscle tissue that runs from the outer side of your hip to the outer side of your thigh and knee. Lie on your side with your left / right leg in the top position. Bend your left / right knee and grab your ankle. Stretch out your bottom arm to help you balance. Slowly bring your knee back so your thigh is slightly behind your body. Slowly lower your knee toward the floor until you feel a gentle stretch on the outside of your left / right thigh. If you do not feel a stretch and your knee will not lower more toward the floor, place the heel of your other foot on top of  your knee and pull your knee down toward the floor with your foot. Hold this position for __________ seconds. Slowly return to the starting position. Repeat __________ times. Complete this exercise __________ times a day. Strengthening exercises These exercises build strength and endurance in your hip and pelvis. Endurance is the ability to use your muscles for a long time, even after they get tired. Bridge This exercise strengthens the muscles that move your thigh backward (hip extensors). Lie on your back on a firm surface with your knees bent and your feet flat on the floor. Tighten your buttocks muscles and lift your buttocks off the floor until your trunk is level with your thighs. Do not arch your back. You should feel the muscles working in your buttocks and the back of your thighs. If you do not feel these muscles, slide your feet 1-2 inches (2.5-5 cm) farther away from your buttocks. If this exercise is too easy, try doing it with your arms crossed over your chest. Hold this position for __________ seconds. Slowly lower your hips to the starting position. Let your muscles relax completely after each repetition. Repeat __________ times. Complete this exercise __________ times a day. Squats This exercise strengthens the muscles in front of your thigh and knee (quadriceps). Stand in front of a table, with your feet and knees pointing straight ahead. You may rest your hands on the table for balance but not for support. Slowly bend your knees and lower your hips like you are going to sit in a chair. Keep your weight over your heels, not over your toes. Keep your lower legs upright so they are parallel with the table legs. Do not let your hips go lower than your knees. Do not bend lower than told by your health care provider. If your hip pain increases, do not bend as low. Hold the squat position for __________ seconds. Slowly push with your legs to return to standing. Do not use your  hands to pull yourself to standing. Repeat __________ times. Complete this exercise __________ times a day. Hip hike  Stand sideways on a  bottom step. Stand on your left / right leg with your other foot unsupported next to the step. You can hold on to the railing or wall for balance if needed. Keep your knees straight and your torso square. Then lift your left / right hip up toward the ceiling. Hold this position for __________ seconds. Slowly let your left / right hip lower toward the floor, past the starting position. Your foot should get closer to the floor. Do not lean or bend your knees. Repeat __________ times. Complete this exercise __________ times a day. Single leg stand This exercise increases your balance. Without shoes, stand near a railing or in a doorway. You may hold on to the railing or door frame as needed for balance. Squeeze your left / right buttock muscles, then lift up your other foot. Do not let your left / right hip push out to the side. It is helpful to stand in front of a mirror for this exercise so you can watch your hip. Hold this position for __________ seconds. Repeat __________ times. Complete this exercise __________ times a day. This information is not intended to replace advice given to you by your health care provider. Make sure you discuss any questions you have with your health care provider. Document Revised: 11/15/2021 Document Reviewed: 11/15/2021 Else[employer] Patient Education  2024 [employer].

## 2024-01-16 ENCOUNTER — Other Ambulatory Visit: Payer: Self-pay | Admitting: Family Medicine

## 2024-01-16 DIAGNOSIS — F5101 Primary insomnia: Secondary | ICD-10-CM

## 2024-01-29 ENCOUNTER — Other Ambulatory Visit: Payer: Self-pay | Admitting: *Deleted

## 2024-01-29 MED ORDER — METHOTREXATE SODIUM 2.5 MG PO TABS: 15.0000 mg | ORAL_TABLET | ORAL | 0 refills | Status: DC

## 2024-01-29 NOTE — Telephone Encounter (Signed)
[facility]  Last Fill: 09/13/2023  Labs: 12/26/2023 CBC and CMP are normal.   Next Visit: 04/14/2024  Last Visit: 01/15/2024  DX: Polymyalgia rheumatica   Current Dose per office note 01/15/2024: Methotrexate 6 tablets by mouth once weekly   Okay to refill Methotrexate?

## 2024-03-03 ENCOUNTER — Other Ambulatory Visit: Payer: Self-pay | Admitting: Family Medicine

## 2024-03-03 DIAGNOSIS — F411 Generalized anxiety disorder: Secondary | ICD-10-CM

## 2024-03-04 DIAGNOSIS — H524 Presbyopia: Secondary | ICD-10-CM | POA: Diagnosis not present

## 2024-03-04 DIAGNOSIS — H25813 Combined forms of age-related cataract, bilateral: Secondary | ICD-10-CM | POA: Diagnosis not present

## 2024-03-04 DIAGNOSIS — H5213 Myopia, bilateral: Secondary | ICD-10-CM | POA: Diagnosis not present

## 2024-03-12 DIAGNOSIS — H5213 Myopia, bilateral: Secondary | ICD-10-CM | POA: Diagnosis not present
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
## 2024-04-01 ENCOUNTER — Other Ambulatory Visit: Payer: Self-pay | Admitting: *Deleted

## 2024-04-01 DIAGNOSIS — Z79899 Other long term (current) drug therapy: Secondary | ICD-10-CM | POA: Diagnosis not present

## 2024-04-01 DIAGNOSIS — M353 Polymyalgia rheumatica: Secondary | ICD-10-CM | POA: Diagnosis not present

## 2024-04-01 DIAGNOSIS — M3509 Sicca syndrome with other organ involvement: Secondary | ICD-10-CM

## 2024-04-02 ENCOUNTER — Other Ambulatory Visit: Payer: Self-pay | Admitting: *Deleted

## 2024-04-02 DIAGNOSIS — M3509 Sicca syndrome with other organ involvement: Secondary | ICD-10-CM | POA: Diagnosis not present

## 2024-04-02 DIAGNOSIS — Z79899 Other long term (current) drug therapy: Secondary | ICD-10-CM | POA: Diagnosis not present

## 2024-04-02 MED ORDER — METHOTREXATE SODIUM 2.5 MG PO TABS: 12.5000 mg | ORAL_TABLET | ORAL | Status: DC

## 2024-04-02 NOTE — Progress Notes (Signed)
Ro antibody remains positive.

## 2024-04-02 NOTE — Telephone Encounter (Signed)
-----   Message from Romayne Clubs sent at 04/02/2024  7:23 AM EDT ----- CBC WNL  ESR WNL Complements WNL AST and ALT are borderline elevated. Please clarify if she has been taking any tylenol? Any other medication dose changes recently? If not--recommend reducing methotrexate to 5 tablets weekly--recheck AST and ALT in 2-3 weeks

## 2024-04-02 NOTE — Progress Notes (Signed)
RF negative.

## 2024-04-02 NOTE — Progress Notes (Signed)
 CBC WNL  ESR WNL Complements WNL AST and ALT are borderline elevated. Please clarify if she has been taking any tylenol? Any other medication dose changes recently? If not--recommend reducing methotrexate to 5 tablets weekly--recheck AST and ALT in 2-3 weeks

## 2024-04-03 ENCOUNTER — Telehealth: Payer: Self-pay | Admitting: Rheumatology

## 2024-04-03 DIAGNOSIS — R7989 Other specified abnormal findings of blood chemistry: Secondary | ICD-10-CM

## 2024-04-03 LAB — C3 AND C4
C3 Complement: 123 mg/dL (ref 83–193)
C4 Complement: 25 mg/dL (ref 15–57)

## 2024-04-03 LAB — CBC WITH DIFFERENTIAL/PLATELET
Absolute Lymphocytes: 1386 {cells}/uL (ref 850–3900)
Absolute Monocytes: 252 {cells}/uL (ref 200–950)
Basophils Absolute: 29 {cells}/uL (ref 0–200)
Basophils Relative: 0.7 %
Eosinophils Absolute: 42 {cells}/uL (ref 15–500)
Eosinophils Relative: 1 %
HCT: 37.8 % (ref 35.0–45.0)
Hemoglobin: 12.8 g/dL (ref 11.7–15.5)
MCH: 31.8 pg (ref 27.0–33.0)
MCHC: 33.9 g/dL (ref 32.0–36.0)
MCV: 94 fL (ref 80.0–100.0)
MPV: 9.3 fL (ref 7.5–12.5)
Monocytes Relative: 6 %
Neutro Abs: 2491 {cells}/uL (ref 1500–7800)
Neutrophils Relative %: 59.3 %
Platelets: 233 10*3/uL (ref 140–400)
RBC: 4.02 10*6/uL (ref 3.80–5.10)
RDW: 13.1 % (ref 11.0–15.0)
Total Lymphocyte: 33 %
WBC: 4.2 10*3/uL (ref 3.8–10.8)

## 2024-04-03 LAB — URINALYSIS, ROUTINE W REFLEX MICROSCOPIC
Bacteria, UA: NONE SEEN /HPF
Bilirubin Urine: NEGATIVE
Glucose, UA: NEGATIVE
Hgb urine dipstick: NEGATIVE
Hyaline Cast: NONE SEEN /LPF
Ketones, ur: NEGATIVE
Nitrite: NEGATIVE
Protein, ur: NEGATIVE
RBC / HPF: NONE SEEN /HPF (ref 0–2)
Specific Gravity, Urine: 1.008 (ref 1.001–1.035)
Squamous Epithelial / HPF: NONE SEEN /HPF (ref <=5)
pH: 6.5 (ref 5.0–8.0)

## 2024-04-03 LAB — ANTI-NUCLEAR AB-TITER (ANA TITER)
ANA TITER: 1:40 {titer} — ABNORMAL HIGH
ANA TITER: 1:80 {titer} — ABNORMAL HIGH
ANA Titer 1: 1:80 {titer} — ABNORMAL HIGH

## 2024-04-03 LAB — PROTEIN ELECTROPHORESIS, SERUM, WITH REFLEX
Albumin ELP: 4.2 g/dL (ref 3.8–4.8)
Alpha 1: 0.3 g/dL (ref 0.2–0.3)
Alpha 2: 0.6 g/dL (ref 0.5–0.9)
Beta 2: 0.3 g/dL (ref 0.2–0.5)
Beta Globulin: 0.3 g/dL — ABNORMAL LOW (ref 0.4–0.6)
Gamma Globulin: 0.8 g/dL (ref 0.8–1.7)
Total Protein: 6.5 g/dL (ref 6.1–8.1)

## 2024-04-03 LAB — COMPREHENSIVE METABOLIC PANEL WITH GFR
AG Ratio: 1.9 (calc) (ref 1.0–2.5)
ALT: 32 U/L — ABNORMAL HIGH (ref 6–29)
AST: 36 U/L — ABNORMAL HIGH (ref 10–35)
Albumin: 4.4 g/dL (ref 3.6–5.1)
Alkaline phosphatase (APISO): 52 U/L (ref 37–153)
BUN: 13 mg/dL (ref 7–25)
CO2: 27 mmol/L (ref 20–32)
Calcium: 9.4 mg/dL (ref 8.6–10.4)
Chloride: 103 mmol/L (ref 98–110)
Creat: 0.8 mg/dL (ref 0.60–1.00)
Globulin: 2.3 g/dL (ref 1.9–3.7)
Glucose, Bld: 85 mg/dL (ref 65–99)
Potassium: 4.3 mmol/L (ref 3.5–5.3)
Sodium: 138 mmol/L (ref 135–146)
Total Bilirubin: 0.6 mg/dL (ref 0.2–1.2)
Total Protein: 6.7 g/dL (ref 6.1–8.1)
eGFR: 77 mL/min/{1.73_m2} (ref >=60)

## 2024-04-03 LAB — RHEUMATOID FACTOR: Rheumatoid fact SerPl-aCnc: <10 [IU]/mL (ref <14)

## 2024-04-03 LAB — SJOGRENS SYNDROME-A EXTRACTABLE NUCLEAR ANTIBODY: SSA (Ro) (ENA) Antibody, IgG: 6.3 AI — AB

## 2024-04-03 LAB — SEDIMENTATION RATE: Sed Rate: 9 mm/h (ref 0–30)

## 2024-04-03 LAB — ANA: Anti Nuclear Antibody (ANA): POSITIVE — AB

## 2024-04-03 NOTE — Telephone Encounter (Signed)
 Patient and her husband had several questions regarding lab results. Clarified that patient should seek eval with PCP about symptoms of UTI if she develops and also clarified rechecking AST and ALT in 2-3 weeks. Placed future lab orders.

## 2024-04-03 NOTE — Progress Notes (Signed)
 UA revealed 1+ leukocytes. Rest of UA normal.  If she develops symptoms of a UTI she should have UA rechecked.

## 2024-04-03 NOTE — Telephone Encounter (Signed)
 Patient returned Deborah Mccullough's call.

## 2024-04-05 NOTE — Progress Notes (Signed)
ANA remains positive.   SPEP did not reveal any abnormal protein bands.

## 2024-04-14 ENCOUNTER — Ambulatory Visit: Payer: Medicare HMO | Attending: Physician Assistant | Admitting: Physician Assistant

## 2024-04-14 ENCOUNTER — Encounter: Payer: Self-pay | Admitting: Physician Assistant

## 2024-04-14 VITALS — BP 147/84 | HR 53 | Resp 16 | Ht 62.0 in | Wt 141.8 lb

## 2024-04-14 DIAGNOSIS — G8929 Other chronic pain: Secondary | ICD-10-CM

## 2024-04-14 DIAGNOSIS — F32A Depression, unspecified: Secondary | ICD-10-CM

## 2024-04-14 DIAGNOSIS — Z7952 Long term (current) use of systemic steroids: Secondary | ICD-10-CM | POA: Diagnosis not present

## 2024-04-14 DIAGNOSIS — M3509 Sicca syndrome with other organ involvement: Secondary | ICD-10-CM

## 2024-04-14 DIAGNOSIS — M19012 Primary osteoarthritis, left shoulder: Secondary | ICD-10-CM

## 2024-04-14 DIAGNOSIS — G4709 Other insomnia: Secondary | ICD-10-CM

## 2024-04-14 DIAGNOSIS — M19041 Primary osteoarthritis, right hand: Secondary | ICD-10-CM

## 2024-04-14 DIAGNOSIS — F419 Anxiety disorder, unspecified: Secondary | ICD-10-CM

## 2024-04-14 DIAGNOSIS — M19032 Primary osteoarthritis, left wrist: Secondary | ICD-10-CM

## 2024-04-14 DIAGNOSIS — M1712 Unilateral primary osteoarthritis, left knee: Secondary | ICD-10-CM

## 2024-04-14 DIAGNOSIS — M810 Age-related osteoporosis without current pathological fracture: Secondary | ICD-10-CM

## 2024-04-14 DIAGNOSIS — M353 Polymyalgia rheumatica: Secondary | ICD-10-CM | POA: Diagnosis not present

## 2024-04-14 DIAGNOSIS — M25552 Pain in left hip: Secondary | ICD-10-CM

## 2024-04-14 DIAGNOSIS — Z8669 Personal history of other diseases of the nervous system and sense organs: Secondary | ICD-10-CM

## 2024-04-14 DIAGNOSIS — Z79899 Other long term (current) drug therapy: Secondary | ICD-10-CM | POA: Diagnosis not present

## 2024-04-14 DIAGNOSIS — R7982 Elevated C-reactive protein (CRP): Secondary | ICD-10-CM

## 2024-04-14 DIAGNOSIS — M19042 Primary osteoarthritis, left hand: Secondary | ICD-10-CM

## 2024-04-14 DIAGNOSIS — F5101 Primary insomnia: Secondary | ICD-10-CM

## 2024-04-14 DIAGNOSIS — M25511 Pain in right shoulder: Secondary | ICD-10-CM | POA: Diagnosis not present

## 2024-04-14 DIAGNOSIS — M25551 Pain in right hip: Secondary | ICD-10-CM

## 2024-04-14 DIAGNOSIS — R5383 Other fatigue: Secondary | ICD-10-CM | POA: Diagnosis not present

## 2024-04-14 DIAGNOSIS — E7800 Pure hypercholesterolemia, unspecified: Secondary | ICD-10-CM

## 2024-04-14 DIAGNOSIS — I10 Essential (primary) hypertension: Secondary | ICD-10-CM

## 2024-04-14 DIAGNOSIS — M25512 Pain in left shoulder: Secondary | ICD-10-CM

## 2024-04-14 NOTE — Patient Instructions (Signed)

## 2024-04-23 ENCOUNTER — Encounter: Payer: Self-pay | Admitting: Family Medicine

## 2024-04-23 ENCOUNTER — Ambulatory Visit (INDEPENDENT_AMBULATORY_CARE_PROVIDER_SITE_OTHER): Payer: Medicare HMO | Admitting: Family Medicine

## 2024-04-23 VITALS — BP 122/60 | HR 57 | Ht 62.0 in | Wt 141.0 lb

## 2024-04-23 DIAGNOSIS — E618 Deficiency of other specified nutrient elements: Secondary | ICD-10-CM | POA: Diagnosis not present

## 2024-04-23 DIAGNOSIS — R7989 Other specified abnormal findings of blood chemistry: Secondary | ICD-10-CM | POA: Diagnosis not present

## 2024-04-23 DIAGNOSIS — M810 Age-related osteoporosis without current pathological fracture: Secondary | ICD-10-CM

## 2024-04-23 DIAGNOSIS — F339 Major depressive disorder, recurrent, unspecified: Secondary | ICD-10-CM | POA: Diagnosis not present

## 2024-04-23 DIAGNOSIS — I10 Essential (primary) hypertension: Secondary | ICD-10-CM

## 2024-04-23 DIAGNOSIS — F5101 Primary insomnia: Secondary | ICD-10-CM

## 2024-04-23 DIAGNOSIS — L821 Other seborrheic keratosis: Secondary | ICD-10-CM

## 2024-04-23 DIAGNOSIS — E7800 Pure hypercholesterolemia, unspecified: Secondary | ICD-10-CM | POA: Diagnosis not present

## 2024-04-23 DIAGNOSIS — H6122 Impacted cerumen, left ear: Secondary | ICD-10-CM

## 2024-04-23 MED ORDER — ATORVASTATIN CALCIUM 20 MG PO TABS: 20.0000 mg | ORAL_TABLET | ORAL | 1 refills | Status: DC

## 2024-04-23 NOTE — Progress Notes (Signed)
 Established Patient Office Visit  Subjective  Patient ID: Deborah Mccullough, female    DOB: Oct 07, 1951  Age: 73 y.o. MRN: [id number]  Chief Complaint  Patient presents with   Hypertension    HPI  Hypertension- Pt denies chest pain, SOB, dizziness, or heart palpitations.  Taking meds as directed w/o problems.  Denies medication side effects.    She has some concerns.    Needs updated DEXA. Has been on fosamax  x 2 years.  Tolerating well.   Skin lesion behind the left ear getting larger. Occ tender. Would like it evaluated. She has smaller similar lesions behind her right hear as well.    Also had labs done recently with rheumatology she had a mild bump in liver function she does not take any Tylenol  products and has not had alcohol.  They did make a slight adjustment down on her methotrexate  and so far she is actually doing well on it.  But they did want her to have her liver function rechecked after 4 weeks.  So she is due for that today.    ROS    Objective:     BP 122/60   Pulse (!) 57   Ht 5\' 2"  (1.575 m)   Wt 141 lb (64 kg)   SpO2 98%   BMI 25.79 kg/m    Physical Exam Vitals and nursing note reviewed.  Constitutional:      Appearance: Normal appearance.  HENT:     Head: Normocephalic and atraumatic.  Eyes:     Conjunctiva/sclera: Conjunctivae normal.  Cardiovascular:     Rate and Rhythm: Normal rate and regular rhythm.  Pulmonary:     Effort: Pulmonary effort is normal.     Breath sounds: Normal breath sounds.  Skin:    General: Skin is warm and dry.     Comments: He has several seborrheic keratoses behind both ears.  Neurological:     Mental Status: She is alert.  Psychiatric:        Mood and Affect: Mood normal.     No results found for any visits on 04/23/24.    The 10-year ASCVD risk score (Arnett DK, et al., 2019) is: 17.1%    Assessment & Plan:   Problem List Items Addressed This Visit       Cardiovascular and Mediastinum    Essential hypertension, benign - Primary   At goal. F/U in 6 mo       Relevant Medications   atorvastatin  (LIPITOR) 20 MG tablet     Musculoskeletal and Integument   Osteoporosis   Will get 1 density test up-to-date.  Continue with Fosamax , calcium  vitamin D  and weight resistance training.      Relevant Orders   DG Bone Density     Other   Primary insomnia   Doing well on Ambien .  Will refill when due.        HYPERCHOLESTEROLEMIA   RF meds       Relevant Medications   atorvastatin  (LIPITOR) 20 MG tablet   Depression, recurrent (HCC)   She is happy with her current regimen of Lexapro .  PHQ 9 and GAD-7 scores look good today.  Flowsheet Row Office Visit from 04/23/2024 in [facility] at [facility]  PHQ-9 Total Score 7         04/23/2024    9:37 AM 12/26/2023    8:30 AM 11/06/2022    3:16 PM 10/31/2022   11:34 AM  GAD  7 : Generalized Anxiety Score  Nervous, Anxious, on Edge 1 1 1 1   Control/stop worrying 0 1 1 0  Worry too much - different things 0 1 1 0  Trouble relaxing 0 1 1 1   Restless 0 1 1 2   Easily annoyed or irritable 0 1 1 0  Afraid - awful might happen 0 1 1 0  Total GAD 7 Score 1 7 7 4   Anxiety Difficulty Not difficult at all Very difficult Very difficult Not difficult at all          Other Visit Diagnoses       Elevated liver function tests       Relevant Orders   Hepatic function panel     Mineral deficiency       Relevant Orders   B12     Seborrheic keratoses         Impacted cerumen of left ear          Recommend she return to have the seborrheic keratoses frozen.  She has a few behind both ears but she also has a few on her side and that bra strap area that she would like to have treated as well.  We can schedule her for cryotherapy at her convenience.  Seborrheic keratoses-discussed treatment with cryotherapy she will return at her convenience to have it done in the next couple of  months.  Recheck on abnormal liver function after reduction in her methotrexate  dose.  Left ear cerumen impaction.irrigation performed and tolerated well.   Indication: Cerumen impaction of the ear(s) Medical necessity statement: On physical examination, cerumen impairs clinically significant portions of the external auditory canal, and tympanic membrane. Noted obstructive, copious cerumen that cannot be removed without magnification and instrumentations  Consent: Discussed benefits and risks of procedure and verbal consent obtained Procedure: Patient was prepped for the procedure. Utilized an otoscope to assess and take note of the ear canal, the tympanic membrane, and the presence, amount, and placement of the cerumen. Gentle water irrigation and soft plastic curette was utilized to remove cerumen.  Post procedure examination: shows cerumen was completely removed. Patient tolerated procedure well. The patient is made aware that they may experience temporary vertigo, temporary hearing loss, and temporary discomfort. If these symptom last for more than 24 hours to call the clinic or proceed to the ED.    Return in about 6 months (around 10/24/2024) for Hypertension.    Duaine German, MD

## 2024-04-23 NOTE — Assessment & Plan Note (Addendum)
 Doing well on Ambien .  Will refill when due.

## 2024-04-23 NOTE — Assessment & Plan Note (Signed)
 At goal. F/U in 6 mo

## 2024-04-23 NOTE — Assessment & Plan Note (Signed)
 Will get 1 density test up-to-date.  Continue with Fosamax , calcium  vitamin D  and weight resistance training.

## 2024-04-23 NOTE — Assessment & Plan Note (Signed)
 She is happy with her current regimen of Lexapro .  PHQ 9 and GAD-7 scores look good today.  Flowsheet Row Office Visit from 04/23/2024 in [facility] at [facility]  PHQ-9 Total Score 7         04/23/2024    9:37 AM 12/26/2023    8:30 AM 11/06/2022    3:16 PM 10/31/2022   11:34 AM  GAD 7 : Generalized Anxiety Score  Nervous, Anxious, on Edge 1 1 1 1   Control/stop worrying 0 1 1 0  Worry too much - different things 0 1 1 0  Trouble relaxing 0 1 1 1   Restless 0 1 1 2   Easily annoyed or irritable 0 1 1 0  Afraid - awful might happen 0 1 1 0  Total GAD 7 Score 1 7 7 4   Anxiety Difficulty Not difficult at all Very difficult Very difficult Not difficult at all

## 2024-04-23 NOTE — Assessment & Plan Note (Signed)
 RF meds

## 2024-04-24 ENCOUNTER — Other Ambulatory Visit: Payer: Self-pay | Admitting: Physician Assistant

## 2024-04-24 ENCOUNTER — Other Ambulatory Visit: Payer: Self-pay | Admitting: Family Medicine

## 2024-04-24 ENCOUNTER — Encounter: Payer: Self-pay | Admitting: Family Medicine

## 2024-04-24 DIAGNOSIS — M810 Age-related osteoporosis without current pathological fracture: Secondary | ICD-10-CM

## 2024-04-24 LAB — HEPATIC FUNCTION PANEL
ALT: 22 IU/L (ref 0–32)
AST: 27 IU/L (ref 0–40)
Albumin: 4.4 g/dL (ref 3.8–4.8)
Alkaline Phosphatase: 58 IU/L (ref 44–121)
Bilirubin Total: 0.6 mg/dL (ref 0.0–1.2)
Bilirubin, Direct: 0.18 mg/dL (ref 0.00–0.40)
Total Protein: 6.4 g/dL (ref 6.0–8.5)

## 2024-04-24 LAB — VITAMIN B12: Vitamin B-12: 353 pg/mL (ref 232–1245)

## 2024-04-24 NOTE — Telephone Encounter (Signed)
 Last Fill: 04/02/2024  Labs: 04/01/2024  CBC WNL ESR WNL Complements WNL AST and ALT are borderline elevated  Next Visit: 08/14/2024  Last Visit: 04/14/2024  DX: Polymyalgia rheumatica   Current Dose per office note 04/14/2024: methotrexate  5 tablets by mouth once weekly  Contacted patient to verify that she is taking 5 tablets and not 6.   Okay to refill Methotrexate ?

## 2024-04-24 NOTE — Progress Notes (Signed)
 Deborah Mccullough, your repeat liver enzymes look great.  Vitamin B12 is normal.

## 2024-04-24 NOTE — Progress Notes (Signed)
 Hi Cathlin your repeat liver enzymes look great, and your B12 is normal.

## 2024-04-27 ENCOUNTER — Ambulatory Visit

## 2024-04-27 DIAGNOSIS — M810 Age-related osteoporosis without current pathological fracture: Secondary | ICD-10-CM | POA: Diagnosis not present

## 2024-04-27 DIAGNOSIS — Z780 Asymptomatic menopausal state: Secondary | ICD-10-CM | POA: Diagnosis not present

## 2024-04-27 DIAGNOSIS — M8589 Other specified disorders of bone density and structure, multiple sites: Secondary | ICD-10-CM | POA: Diagnosis not present

## 2024-04-30 ENCOUNTER — Ambulatory Visit: Payer: Self-pay | Admitting: Family Medicine

## 2024-04-30 NOTE — Progress Notes (Signed)
 HI Deborah Mccullough. Bone density shows T score of -2.4 which is in the osteopenia range. Continue your Fosamax , vitamin D  and calcium .  Work on [employer] bearing exercise.

## 2024-05-01 ENCOUNTER — Other Ambulatory Visit: Payer: Self-pay | Admitting: Family Medicine

## 2024-05-01 DIAGNOSIS — F5101 Primary insomnia: Secondary | ICD-10-CM

## 2024-05-01 NOTE — Progress Notes (Signed)
 I would recommend that she continue with the medication for a total of 5 years.  Typically after that you can take a drug holiday.  The jaw issues are rare and was primarily seen in patients who are undergoing cancer treatment.

## 2024-06-12 ENCOUNTER — Other Ambulatory Visit: Payer: Self-pay | Admitting: Family Medicine

## 2024-06-17 ENCOUNTER — Other Ambulatory Visit: Payer: Self-pay | Admitting: Family Medicine

## 2024-07-06 ENCOUNTER — Other Ambulatory Visit: Payer: Self-pay | Admitting: *Deleted

## 2024-07-06 MED ORDER — METHOTREXATE SODIUM 2.5 MG PO TABS: 12.5000 mg | ORAL_TABLET | ORAL | 0 refills | Status: DC

## 2024-07-06 NOTE — Telephone Encounter (Signed)
 Refill request received via fax from [facility] for Methotrexate   Last Fill: 04/24/2024  Labs: 04/01/2024  CBC WNL  ESR WNL  Complements WNL  AST and ALT are borderline elevated.   Next Visit: 08/14/2024  Last Visit: 04/14/2024  DX: Polymyalgia rheumatica   Current Dose per office note 04/01/2024: -recommend reducing methotrexate  to 5 tablets weekly   Left message to advise patient she is due to update labs.   Okay to refill Methotrexate ?

## 2024-07-07 ENCOUNTER — Telehealth: Payer: Self-pay | Admitting: *Deleted

## 2024-07-07 NOTE — Telephone Encounter (Signed)
 Patient contacted the office and left message about information on medication, next appointment and labs.  Attempted to contact the patient and left message to advise patient she is due for her next labs now, reviewed lab hours. Advised next appointment date and time. Advised patient her prescription was sent to the pharmacy yesterday 07/06/2024. Patient advised to contact the office if she has any further questions or concerns.

## 2024-07-08 ENCOUNTER — Other Ambulatory Visit: Payer: Self-pay | Admitting: *Deleted

## 2024-07-08 DIAGNOSIS — Z79899 Other long term (current) drug therapy: Secondary | ICD-10-CM | POA: Diagnosis not present

## 2024-07-08 DIAGNOSIS — M2550 Pain in unspecified joint: Secondary | ICD-10-CM | POA: Diagnosis not present

## 2024-07-08 DIAGNOSIS — M791 Myalgia, unspecified site: Secondary | ICD-10-CM

## 2024-07-08 DIAGNOSIS — R5383 Other fatigue: Secondary | ICD-10-CM

## 2024-07-08 DIAGNOSIS — M353 Polymyalgia rheumatica: Secondary | ICD-10-CM

## 2024-07-08 DIAGNOSIS — R768 Other specified abnormal immunological findings in serum: Secondary | ICD-10-CM

## 2024-07-08 DIAGNOSIS — R7982 Elevated C-reactive protein (CRP): Secondary | ICD-10-CM | POA: Diagnosis not present

## 2024-07-08 DIAGNOSIS — R7989 Other specified abnormal findings of blood chemistry: Secondary | ICD-10-CM | POA: Diagnosis not present

## 2024-07-08 DIAGNOSIS — M3509 Sicca syndrome with other organ involvement: Secondary | ICD-10-CM

## 2024-07-08 DIAGNOSIS — Z7952 Long term (current) use of systemic steroids: Secondary | ICD-10-CM | POA: Diagnosis not present

## 2024-07-08 LAB — COMPREHENSIVE METABOLIC PANEL WITH GFR
AG Ratio: 2 (calc) (ref 1.0–2.5)
ALT: 28 U/L (ref 6–29)
AST: 31 U/L (ref 10–35)
Albumin: 4.1 g/dL (ref 3.6–5.1)
Alkaline phosphatase (APISO): 46 U/L (ref 37–153)
BUN: 13 mg/dL (ref 7–25)
CO2: 29 mmol/L (ref 20–32)
Calcium: 9.1 mg/dL (ref 8.6–10.4)
Chloride: 105 mmol/L (ref 98–110)
Creat: 0.85 mg/dL (ref 0.60–1.00)
Globulin: 2.1 g/dL (ref 1.9–3.7)
Glucose, Bld: 85 mg/dL (ref 65–99)
Potassium: 4.5 mmol/L (ref 3.5–5.3)
Sodium: 140 mmol/L (ref 135–146)
Total Bilirubin: 0.5 mg/dL (ref 0.2–1.2)
Total Protein: 6.2 g/dL (ref 6.1–8.1)
eGFR: 72 mL/min/1.73m2 (ref >=60)

## 2024-07-08 LAB — CBC WITH DIFFERENTIAL/PLATELET
Absolute Lymphocytes: 1326 {cells}/uL (ref 850–3900)
Absolute Monocytes: 312 {cells}/uL (ref 200–950)
Basophils Absolute: 30 {cells}/uL (ref 0–200)
Basophils Relative: 0.8 %
Eosinophils Absolute: 49 {cells}/uL (ref 15–500)
Eosinophils Relative: 1.3 %
HCT: 37.4 % (ref 35.0–45.0)
Hemoglobin: 12.3 g/dL (ref 11.7–15.5)
MCH: 31.5 pg (ref 27.0–33.0)
MCHC: 32.9 g/dL (ref 32.0–36.0)
MCV: 95.9 fL (ref 80.0–100.0)
MPV: 9.1 fL (ref 7.5–12.5)
Monocytes Relative: 8.2 %
Neutro Abs: 2082 {cells}/uL (ref 1500–7800)
Neutrophils Relative %: 54.8 %
Platelets: 211 Thousand/uL (ref 140–400)
RBC: 3.9 Million/uL (ref 3.80–5.10)
RDW: 13.5 % (ref 11.0–15.0)
Total Lymphocyte: 34.9 %
WBC: 3.8 Thousand/uL (ref 3.8–10.8)

## 2024-07-09 ENCOUNTER — Ambulatory Visit: Payer: Self-pay | Admitting: Rheumatology

## 2024-07-09 ENCOUNTER — Encounter: Payer: Self-pay | Admitting: Family Medicine

## 2024-07-09 ENCOUNTER — Ambulatory Visit (INDEPENDENT_AMBULATORY_CARE_PROVIDER_SITE_OTHER): Admitting: Family Medicine

## 2024-07-09 VITALS — BP 129/62 | HR 55 | Ht 64.0 in | Wt 139.0 lb

## 2024-07-09 DIAGNOSIS — L820 Inflamed seborrheic keratosis: Secondary | ICD-10-CM

## 2024-07-09 NOTE — Progress Notes (Signed)
   Established Patient Office Visit  Subjective  Patient ID: Deborah Mccullough, female    DOB: [birth year]  Age: 73 y.o. MRN: [id number]  Chief Complaint  Patient presents with   mole removal    HPI She has several seborrheic keratoses, that she would like frozen today she had come in previously and had some done underneath the breast area and did really well with those.  She has 1 behind each earlobe and some on her back mostly across her bra strap area and those of the ones she would like treated in addition to one on her right anterior ankle.     ROS    Objective:     BP 129/62   Pulse (!) 55   Ht 5' 4 (1.626 m)   Wt 139 lb 0.6 oz (63.1 kg)   SpO2 97%   BMI 23.87 kg/m    Physical Exam Several scattered seborrheic keratoses, 2 behind each earlobe and 1 on her right ankle that was a little bit more scaly and flat and she says sometimes that 1 actually looks a little red versus brown but today it has a brown appearance.  And several across her back mostly in the bra strap area ones on her back in particular get a very irritated.  She also has 1 between her breasts that she would like treated today as well  No results found for any visits on 07/09/24.    The 10-year ASCVD risk score (Arnett DK, et al., 2019) is: 18.9%    Assessment & Plan:   Problem List Items Addressed This Visit   None Visit Diagnoses       Seborrheic keratosis, inflamed    -  Primary     Cryotherapy Procedure Note  Pre-operative Diagnosis: Seborrheic keratoses, of some of them are irritated and inflamed.  Post-operative Diagnosis: same  Locations: Behind each ear, back, right anterior ankle, 1 between breast.  Indications: inflamed   Anesthesia: not required    Procedure Details  Patient informed of risks (permanent scarring, infection, light or dark discoloration, bleeding, infection, weakness, numbness and recurrence of the lesion) and benefits of the procedure and verbal informed  consent obtained.  The areas are treated with liquid nitrogen therapy, frozen until ice ball extended 1-2 mm beyond lesion, allowed to thaw, and treated again. The patient tolerated procedure well.  The patient was instructed on post-op care, warned that there may be blister formation, redness and pain. Recommend OTC analgesia as needed for pain.  Condition: Stable  Complications: none.  Plan: 1. Instructed to keep the area dry and covered for 24-48h and clean thereafter. 2. Warning signs of infection were reviewed.   3. Recommended that the patient use OTC acetaminophen  as needed for pain.  4. Return PRN.   Follow-up wound care discussed.  Return if any concerns on how they are healing or if any of them do not peel completely then please let us  know.  No follow-ups on file.    Dorothyann Byars, MD

## 2024-07-09 NOTE — Patient Instructions (Signed)
 Ok to wash with regular soap and water.  When start peeling ok to start applying vaseline twice a day until skin is completley healed.

## 2024-07-09 NOTE — Progress Notes (Signed)
 CBC and CMP are normal.
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
## 2024-08-04 ENCOUNTER — Other Ambulatory Visit: Payer: Self-pay | Admitting: Physician Assistant

## 2024-08-04 NOTE — Telephone Encounter (Signed)
 Last Fill: 07/06/2024  Labs: 07/08/2024 CBC and CMP are normal.   Next Visit: 08/14/2024  Last Visit: 04/14/2024  DX:  Polymyalgia rheumatica (HCC)   Current Dose per office note 04/14/2024: Methotrexate  6 tablets by mouth once weekly   Okay to refill Methotrexate ?

## 2024-08-04 NOTE — Telephone Encounter (Signed)
 Attempted to contact the patient and left a message to call the office back.

## 2024-08-04 NOTE — Telephone Encounter (Signed)
 Please clarify if she is taking 5 or 6 tablets of methotrexate 

## 2024-08-05 NOTE — Telephone Encounter (Signed)
 Spoke with patient and she states she is taking MTX 5 tabs po weekly.

## 2024-08-14 ENCOUNTER — Ambulatory Visit: Attending: Physician Assistant | Admitting: Physician Assistant

## 2024-08-14 ENCOUNTER — Encounter: Payer: Self-pay | Admitting: Physician Assistant

## 2024-08-14 VITALS — BP 127/71 | HR 54 | Resp 14 | Ht 62.0 in | Wt 139.6 lb

## 2024-08-14 DIAGNOSIS — R768 Other specified abnormal immunological findings in serum: Secondary | ICD-10-CM

## 2024-08-14 DIAGNOSIS — R5383 Other fatigue: Secondary | ICD-10-CM | POA: Diagnosis not present

## 2024-08-14 DIAGNOSIS — M25512 Pain in left shoulder: Secondary | ICD-10-CM

## 2024-08-14 DIAGNOSIS — R7982 Elevated C-reactive protein (CRP): Secondary | ICD-10-CM

## 2024-08-14 DIAGNOSIS — M25551 Pain in right hip: Secondary | ICD-10-CM

## 2024-08-14 DIAGNOSIS — R7989 Other specified abnormal findings of blood chemistry: Secondary | ICD-10-CM

## 2024-08-14 DIAGNOSIS — M19012 Primary osteoarthritis, left shoulder: Secondary | ICD-10-CM

## 2024-08-14 DIAGNOSIS — Z79899 Other long term (current) drug therapy: Secondary | ICD-10-CM | POA: Diagnosis not present

## 2024-08-14 DIAGNOSIS — M3509 Sicca syndrome with other organ involvement: Secondary | ICD-10-CM | POA: Diagnosis not present

## 2024-08-14 DIAGNOSIS — M1712 Unilateral primary osteoarthritis, left knee: Secondary | ICD-10-CM

## 2024-08-14 DIAGNOSIS — M19041 Primary osteoarthritis, right hand: Secondary | ICD-10-CM

## 2024-08-14 DIAGNOSIS — M19042 Primary osteoarthritis, left hand: Secondary | ICD-10-CM

## 2024-08-14 DIAGNOSIS — M25511 Pain in right shoulder: Secondary | ICD-10-CM

## 2024-08-14 DIAGNOSIS — G8929 Other chronic pain: Secondary | ICD-10-CM

## 2024-08-14 DIAGNOSIS — Z7952 Long term (current) use of systemic steroids: Secondary | ICD-10-CM

## 2024-08-14 DIAGNOSIS — M353 Polymyalgia rheumatica: Secondary | ICD-10-CM

## 2024-08-14 DIAGNOSIS — F32A Depression, unspecified: Secondary | ICD-10-CM

## 2024-08-14 DIAGNOSIS — I10 Essential (primary) hypertension: Secondary | ICD-10-CM

## 2024-08-14 DIAGNOSIS — M810 Age-related osteoporosis without current pathological fracture: Secondary | ICD-10-CM

## 2024-08-14 DIAGNOSIS — F5101 Primary insomnia: Secondary | ICD-10-CM

## 2024-08-14 DIAGNOSIS — M19032 Primary osteoarthritis, left wrist: Secondary | ICD-10-CM

## 2024-08-14 DIAGNOSIS — Z8669 Personal history of other diseases of the nervous system and sense organs: Secondary | ICD-10-CM

## 2024-08-14 DIAGNOSIS — F419 Anxiety disorder, unspecified: Secondary | ICD-10-CM

## 2024-08-14 DIAGNOSIS — M25552 Pain in left hip: Secondary | ICD-10-CM

## 2024-08-14 DIAGNOSIS — E7800 Pure hypercholesterolemia, unspecified: Secondary | ICD-10-CM

## 2024-08-24 ENCOUNTER — Telehealth: Payer: Self-pay | Admitting: *Deleted

## 2024-08-24 NOTE — Telephone Encounter (Signed)
 Please encourage the patient to seek evaluation by PCP if these symptoms persist or worsen.  She should hold methotrexate  until these symptoms resolve.

## 2024-08-24 NOTE — Telephone Encounter (Signed)
 Patient LMOM, patient had fever of 100.6 on Saturday, no current fever, patient has fatigue, runny noise, cough, patient took methotrexate  on Sunday. Please advise what patient should do.

## 2024-08-24 NOTE — Telephone Encounter (Signed)
 I called patient, patient verbalized understanding.

## 2024-09-05 ENCOUNTER — Other Ambulatory Visit: Payer: Self-pay | Admitting: Family Medicine

## 2024-09-05 DIAGNOSIS — F411 Generalized anxiety disorder: Secondary | ICD-10-CM

## 2024-09-23 ENCOUNTER — Other Ambulatory Visit: Payer: Self-pay | Admitting: Family Medicine

## 2024-09-23 DIAGNOSIS — Z1231 Encounter for screening mammogram for malignant neoplasm of breast: Secondary | ICD-10-CM

## 2024-10-21 ENCOUNTER — Encounter: Payer: Self-pay | Admitting: Family Medicine

## 2024-10-21 ENCOUNTER — Ambulatory Visit: Admitting: Family Medicine

## 2024-10-21 VITALS — BP 125/58 | HR 59 | Ht 62.0 in | Wt 140.0 lb

## 2024-10-21 DIAGNOSIS — F5101 Primary insomnia: Secondary | ICD-10-CM | POA: Diagnosis not present

## 2024-10-21 DIAGNOSIS — J449 Chronic obstructive pulmonary disease, unspecified: Secondary | ICD-10-CM | POA: Diagnosis not present

## 2024-10-21 DIAGNOSIS — E7800 Pure hypercholesterolemia, unspecified: Secondary | ICD-10-CM

## 2024-10-21 DIAGNOSIS — R3911 Hesitancy of micturition: Secondary | ICD-10-CM | POA: Diagnosis not present

## 2024-10-21 DIAGNOSIS — Z23 Encounter for immunization: Secondary | ICD-10-CM

## 2024-10-21 DIAGNOSIS — F331 Major depressive disorder, recurrent, moderate: Secondary | ICD-10-CM | POA: Diagnosis not present

## 2024-10-21 DIAGNOSIS — I10 Essential (primary) hypertension: Secondary | ICD-10-CM

## 2024-10-21 NOTE — Assessment & Plan Note (Signed)
 Depression Mood stable on Lexapro . No adjustment needed. - Continue Lexapro  as prescribed.

## 2024-10-21 NOTE — Assessment & Plan Note (Signed)
 Stable. No flares or exacerbations.  Hasn't needed albuterol .  Due for spirometry in the spring.

## 2024-10-21 NOTE — Assessment & Plan Note (Signed)
 Essential hypertension Blood pressure controlled on metoprolol  and losartan . Regimen effective. - Continue metoprolol  and losartan  as prescribed. - labs upcoming with Rheumatology

## 2024-10-21 NOTE — Assessment & Plan Note (Signed)
 Due for refill soon on ambien .

## 2024-10-21 NOTE — Assessment & Plan Note (Signed)
 Pure hypercholesterolemia Cholesterol levels normal. Recall on generic medication; verify with pharmacy. - Contact pharmacy to verify if current cholesterol medication is affected by recall.

## 2024-10-21 NOTE — Progress Notes (Signed)
 Established Patient Office Visit  Patient ID: Deborah Mccullough, female    DOB: 01/29/1951  Age: 73 y.o. MRN: [id number] PCP: Alvan Deborah BIRCH, MD  Chief Complaint  Patient presents with   Hypertension    Subjective:     HPI  Discussed the use of AI scribe software for clinical note transcription with the patient, who gave verbal consent to proceed.  History of Present Illness Deborah Mccullough is a 73 year old female who presents with urinary hesitancy.  Urinary hesitancy - Difficulty initiating urination despite urge to void - Takes five to eight minutes to begin urination - Occasionally unable to urinate at all - Symptoms are more noticeable during group travel, as she takes longer in the bathroom compared to others - No sensation of pressure after being unable to urinate - No urinary urgency or incontinence following episodes of hesitancy - No recent changes to medication regimen - Questions whether methotrexate  may contribute to symptoms  Medication use - Current medications: methotrexate , metoprolol  100 mg, losartan  50 mg, escitalopram  (Lexapro ), folic acid  - Takes vitamin B12 supplements for tiredness, though previous B12 levels were not low  Immunization status - Received both doses of shingles vaccine - Received influenza vaccine - Up to date with tetanus vaccine  Migraine headaches - History of migraine headaches in the past - No migraine episodes in recent years  Blood pressure management - Tolerating metoprolol  and losartan  without issues - No problems with blood pressure control     10/21/2024    8:36 AM 07/09/2024    8:15 AM 04/23/2024    9:37 AM  PHQ9 SCORE ONLY  PHQ-9 Total Score 6 2  7       Data saved with a previous flowsheet row definition      ROS    Objective:     BP (!) 125/58   Pulse (!) 59   Ht 5' 2 (1.575 m)   Wt 140 lb 0.6 oz (63.5 kg)   SpO2 98%   BMI 25.61 kg/m    Physical Exam Vitals and nursing note reviewed.   Constitutional:      Appearance: Normal appearance.  HENT:     Head: Normocephalic and atraumatic.  Eyes:     Conjunctiva/sclera: Conjunctivae normal.  Cardiovascular:     Rate and Rhythm: Normal rate and regular rhythm.  Pulmonary:     Effort: Pulmonary effort is normal.     Breath sounds: Normal breath sounds.  Skin:    General: Skin is warm and dry.  Neurological:     Mental Status: She is alert.  Psychiatric:        Mood and Affect: Mood normal.      No results found for any visits on 10/21/24.    The 10-year ASCVD risk score (Arnett DK, et al., 2019) is: 17.9%    Assessment & Plan:   Problem List Items Addressed This Visit       Cardiovascular and Mediastinum   Essential hypertension, benign - Primary   Essential hypertension Blood pressure controlled on metoprolol  and losartan . Regimen effective. - Continue metoprolol  and losartan  as prescribed. - labs upcoming with Rheumatology        Respiratory   Stage 1 mild COPD by GOLD classification (HCC)   Stable. No flares or exacerbations.  Hasn't needed albuterol .  Due for spirometry in the spring.         Other   Primary insomnia   Due for refill soon on ambien .  MDD (major depressive disorder), recurrent episode, moderate (HCC)   Depression Mood stable on Lexapro . No adjustment needed. - Continue Lexapro  as prescribed.      HYPERCHOLESTEROLEMIA   Pure hypercholesterolemia Cholesterol levels normal. Recall on generic medication; verify with pharmacy. - Contact pharmacy to verify if current cholesterol medication is affected by recall.       Relevant Orders   Lipid panel   Other Visit Diagnoses       Encounter for immunization       Relevant Orders   Flu vaccine HIGH DOSE PF(Fluzone Trivalent) (Completed)     Urinary hesitancy           Assessment and Plan Assessment & Plan   Urinary hesitancy Experiencing hesitancy with difficulty initiating urination. Differential includes  neurogenic bladder, muscle dysfunction, or urethral stricture. Methotrexate  unlikely cause. Symptoms not severe for immediate referral. - Consider urology referral if symptoms worsen or become disruptive. She will think about it.  - Encouraged adequate hydration to prevent urinary tract infections.  General Health Maintenance Flu shot received. Shingles vaccine series completed; second dose not documented. Tetanus vaccine received. Discussed folic acid  and B12 for fatigue. - Verified shingles vaccine records with [facility] and [facility]. - Continue folic acid  supplementation. - she started  B12 supplementation for fatigue, though B12 was normal earlier this year    Return in about 6 months (around 04/20/2025) for Hypertension, Mood.    Deborah Byars, MD [facility] at [facility]

## 2024-10-22 ENCOUNTER — Ambulatory Visit: Payer: Self-pay | Admitting: Family Medicine

## 2024-10-22 LAB — LIPID PANEL
Chol/HDL Ratio: 2.7 ratio (ref 0.0–4.4)
Cholesterol, Total: 169 mg/dL (ref 100–199)
HDL: 63 mg/dL (ref >39)
LDL Chol Calc (NIH): 94 mg/dL (ref 0–99)
Triglycerides: 60 mg/dL (ref 0–149)
VLDL Cholesterol Cal: 12 mg/dL (ref 5–40)

## 2024-10-22 NOTE — Progress Notes (Signed)
 Your lab work is within acceptable range and there are no concerning findings.   ?

## 2024-10-26 ENCOUNTER — Other Ambulatory Visit: Payer: Self-pay | Admitting: Physician Assistant

## 2024-10-26 NOTE — Telephone Encounter (Signed)
 Last Fill: 08/05/2024  Labs: 07/08/2024 CBC and CMP are normal.   Next Visit: 12/02/2024  Last Visit: 08/14/2024  DX: Polymyalgia rheumatica   Current Dose per office note 08/14/2024: methotrexate  5 tablets by mouth once weekly   Left message to advise patient she is due to update her labs.   Okay to refill Methotrexate ?

## 2024-11-01 ENCOUNTER — Other Ambulatory Visit: Payer: Self-pay | Admitting: Family Medicine

## 2024-11-01 DIAGNOSIS — E7800 Pure hypercholesterolemia, unspecified: Secondary | ICD-10-CM

## 2024-11-23 ENCOUNTER — Other Ambulatory Visit: Payer: Self-pay | Admitting: *Deleted

## 2024-11-23 ENCOUNTER — Inpatient Hospital Stay: Admission: RE | Admit: 2024-11-23 | Discharge: 2024-11-23 | Attending: Family Medicine | Admitting: Family Medicine

## 2024-11-23 DIAGNOSIS — M3509 Sicca syndrome with other organ involvement: Secondary | ICD-10-CM | POA: Diagnosis not present

## 2024-11-23 DIAGNOSIS — Z79899 Other long term (current) drug therapy: Secondary | ICD-10-CM

## 2024-11-23 DIAGNOSIS — R7989 Other specified abnormal findings of blood chemistry: Secondary | ICD-10-CM

## 2024-11-23 DIAGNOSIS — Z1231 Encounter for screening mammogram for malignant neoplasm of breast: Secondary | ICD-10-CM | POA: Diagnosis not present

## 2024-11-23 DIAGNOSIS — R7982 Elevated C-reactive protein (CRP): Secondary | ICD-10-CM | POA: Diagnosis not present

## 2024-11-23 DIAGNOSIS — R7689 Other specified abnormal immunological findings in serum: Secondary | ICD-10-CM

## 2024-11-23 DIAGNOSIS — M353 Polymyalgia rheumatica: Secondary | ICD-10-CM
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
## 2024-11-24 ENCOUNTER — Other Ambulatory Visit: Payer: Self-pay | Admitting: Physician Assistant

## 2024-11-24 ENCOUNTER — Ambulatory Visit: Payer: Self-pay | Admitting: Physician Assistant

## 2024-11-24 NOTE — Progress Notes (Signed)
Complements WNL

## 2024-11-24 NOTE — Progress Notes (Signed)
 CBC and CMP WNL RF negative  ESR WNL CRP is slightly elevated-any signs of inflammation?

## 2024-11-24 NOTE — Telephone Encounter (Signed)
 Last Fill: 10/26/2024 (30 day supply)  Labs: 11/23/2024 CBC and CMP WNL   Next Visit: 12/02/2024  Last Visit: 08/14/2024  DX: Polymyalgia rheumatica   Current Dose per office note 08/14/2024:  methotrexate  5 tablets by mouth once weekly   Okay to refill Methotrexate ?

## 2024-11-25 NOTE — Progress Notes (Signed)
 SPEP  normal.

## 2024-11-25 NOTE — Progress Notes (Signed)
 Ro antibody remains positive. La antibody negative.  dsDNA negative.

## 2024-11-27 LAB — CBC WITH DIFFERENTIAL/PLATELET
Absolute Lymphocytes: 1219 {cells}/uL (ref 850–3900)
Absolute Monocytes: 422 {cells}/uL (ref 200–950)
Basophils Absolute: 40 {cells}/uL (ref 0–200)
Basophils Relative: 0.6 %
Eosinophils Absolute: 80 {cells}/uL (ref 15–500)
Eosinophils Relative: 1.2 %
HCT: 37.2 % (ref 35.9–46.0)
Hemoglobin: 12.2 g/dL (ref 11.7–15.5)
MCH: 31 pg (ref 27.0–33.0)
MCHC: 32.8 g/dL (ref 31.6–35.4)
MCV: 94.7 fL (ref 81.4–101.7)
MPV: 9.2 fL (ref 7.5–12.5)
Monocytes Relative: 6.3 %
Neutro Abs: 4938 {cells}/uL (ref 1500–7800)
Neutrophils Relative %: 73.7 %
Platelets: 270 Thousand/uL (ref 140–400)
RBC: 3.93 Million/uL (ref 3.80–5.10)
RDW: 13.3 % (ref 11.0–15.0)
Total Lymphocyte: 18.2 %
WBC: 6.7 Thousand/uL (ref 3.8–10.8)

## 2024-11-27 LAB — ANA: Anti Nuclear Antibody (ANA): POSITIVE — AB

## 2024-11-27 LAB — PROTEIN ELECTROPHORESIS, SERUM, WITH REFLEX
Albumin ELP: 4 g/dL (ref 3.8–4.8)
Alpha 1: 0.3 g/dL (ref 0.2–0.3)
Alpha 2: 0.7 g/dL (ref 0.5–0.9)
Beta 2: 0.3 g/dL (ref 0.2–0.5)
Beta Globulin: 0.4 g/dL (ref 0.4–0.6)
Gamma Globulin: 0.8 g/dL (ref 0.8–1.7)
Total Protein: 6.5 g/dL (ref 6.1–8.1)

## 2024-11-27 LAB — COMPREHENSIVE METABOLIC PANEL WITH GFR
AG Ratio: 2 (calc) (ref 1.0–2.5)
ALT: 12 U/L (ref 6–29)
AST: 22 U/L (ref 10–35)
Albumin: 4.3 g/dL (ref 3.6–5.1)
Alkaline phosphatase (APISO): 51 U/L (ref 37–153)
BUN: 15 mg/dL (ref 7–25)
CO2: 29 mmol/L (ref 20–32)
Calcium: 9.3 mg/dL (ref 8.6–10.4)
Chloride: 103 mmol/L (ref 98–110)
Creat: 0.76 mg/dL (ref 0.60–1.00)
Globulin: 2.1 g/dL (ref 1.9–3.7)
Glucose, Bld: 88 mg/dL (ref 65–99)
Potassium: 4.5 mmol/L (ref 3.5–5.3)
Sodium: 139 mmol/L (ref 135–146)
Total Bilirubin: 0.5 mg/dL (ref 0.2–1.2)
Total Protein: 6.4 g/dL (ref 6.1–8.1)
eGFR: 82 mL/min/1.73m2 (ref >=60)

## 2024-11-27 LAB — ANTI-NUCLEAR AB-TITER (ANA TITER)
ANA TITER: 1:40 {titer} — ABNORMAL HIGH
ANA Titer 1: 1:40 {titer} — ABNORMAL HIGH

## 2024-11-27 LAB — C-REACTIVE PROTEIN: CRP: 9.9 mg/L — ABNORMAL HIGH (ref <8.0)

## 2024-11-27 LAB — RHEUMATOID FACTOR: Rheumatoid fact SerPl-aCnc: <10 [IU]/mL (ref <14)

## 2024-11-27 LAB — C3 AND C4
C3 Complement: 129 mg/dL (ref 83–193)
C4 Complement: 28 mg/dL (ref 15–57)

## 2024-11-27 LAB — ANTI-DNA ANTIBODY, DOUBLE-STRANDED: ds DNA Ab: <1 [IU]/mL

## 2024-11-27 LAB — SJOGRENS SYNDROME-B EXTRACTABLE NUCLEAR ANTIBODY: SSB (La) (ENA) Antibody, IgG: <1 AI

## 2024-11-27 LAB — SEDIMENTATION RATE: Sed Rate: 17 mm/h (ref 0–30)

## 2024-11-27 LAB — SJOGRENS SYNDROME-A EXTRACTABLE NUCLEAR ANTIBODY: SSA (Ro) (ENA) Antibody, IgG: 5.8 AI — AB

## 2024-11-30 ENCOUNTER — Ambulatory Visit: Payer: Self-pay | Admitting: Family Medicine

## 2024-11-30 NOTE — Progress Notes (Signed)
 ANA remains positive.

## 2024-11-30 NOTE — Progress Notes (Signed)
 Please call patient. Normal mammogram.  Repeat in 1 year.

## 2024-12-02 ENCOUNTER — Ambulatory Visit: Attending: Physician Assistant | Admitting: Physician Assistant

## 2024-12-02 ENCOUNTER — Encounter: Payer: Self-pay | Admitting: Physician Assistant

## 2024-12-02 VITALS — BP 146/79 | HR 55 | Temp 97.6°F | Resp 16 | Ht 62.0 in | Wt 139.4 lb

## 2024-12-02 DIAGNOSIS — E7800 Pure hypercholesterolemia, unspecified: Secondary | ICD-10-CM

## 2024-12-02 DIAGNOSIS — Z7952 Long term (current) use of systemic steroids: Secondary | ICD-10-CM | POA: Diagnosis not present

## 2024-12-02 DIAGNOSIS — M19012 Primary osteoarthritis, left shoulder: Secondary | ICD-10-CM | POA: Diagnosis not present

## 2024-12-02 DIAGNOSIS — R7982 Elevated C-reactive protein (CRP): Secondary | ICD-10-CM | POA: Diagnosis not present

## 2024-12-02 DIAGNOSIS — M3509 Sicca syndrome with other organ involvement: Secondary | ICD-10-CM

## 2024-12-02 DIAGNOSIS — F32A Depression, unspecified: Secondary | ICD-10-CM

## 2024-12-02 DIAGNOSIS — I10 Essential (primary) hypertension: Secondary | ICD-10-CM

## 2024-12-02 DIAGNOSIS — F5101 Primary insomnia: Secondary | ICD-10-CM

## 2024-12-02 DIAGNOSIS — R7989 Other specified abnormal findings of blood chemistry: Secondary | ICD-10-CM | POA: Diagnosis not present

## 2024-12-02 DIAGNOSIS — M353 Polymyalgia rheumatica: Secondary | ICD-10-CM | POA: Diagnosis not present

## 2024-12-02 DIAGNOSIS — R7689 Other specified abnormal immunological findings in serum: Secondary | ICD-10-CM

## 2024-12-02 DIAGNOSIS — M19041 Primary osteoarthritis, right hand: Secondary | ICD-10-CM | POA: Diagnosis not present

## 2024-12-02 DIAGNOSIS — M25552 Pain in left hip: Secondary | ICD-10-CM

## 2024-12-02 DIAGNOSIS — M19032 Primary osteoarthritis, left wrist: Secondary | ICD-10-CM

## 2024-12-02 DIAGNOSIS — Z79899 Other long term (current) drug therapy: Secondary | ICD-10-CM

## 2024-12-02 DIAGNOSIS — R5383 Other fatigue: Secondary | ICD-10-CM | POA: Diagnosis not present

## 2024-12-02 DIAGNOSIS — G8929 Other chronic pain: Secondary | ICD-10-CM

## 2024-12-02 DIAGNOSIS — M25551 Pain in right hip: Secondary | ICD-10-CM

## 2024-12-02 DIAGNOSIS — M25512 Pain in left shoulder: Secondary | ICD-10-CM

## 2024-12-02 DIAGNOSIS — M25511 Pain in right shoulder: Secondary | ICD-10-CM

## 2024-12-02 DIAGNOSIS — M1712 Unilateral primary osteoarthritis, left knee: Secondary | ICD-10-CM

## 2024-12-02 DIAGNOSIS — M19042 Primary osteoarthritis, left hand: Secondary | ICD-10-CM

## 2024-12-02 DIAGNOSIS — F419 Anxiety disorder, unspecified: Secondary | ICD-10-CM

## 2024-12-02 DIAGNOSIS — M810 Age-related osteoporosis without current pathological fracture: Secondary | ICD-10-CM

## 2024-12-02 DIAGNOSIS — Z8669 Personal history of other diseases of the nervous system and sense organs: Secondary | ICD-10-CM

## 2024-12-02 NOTE — Patient Instructions (Addendum)
 Standing Labs We placed an order today for your standing lab work.   Please have your standing labs drawn in March and every 3 months  Please have your labs drawn 2 weeks prior to your appointment so that the provider can discuss your lab results at your appointment, if possible.  Please note that you may see your imaging and lab results in MyChart before we have reviewed them. We will contact you once all results are reviewed. Please allow our office up to 72 hours to thoroughly review all of the results before contacting the office for clarification of your results.  WALK-IN LAB HOURS  Monday through Thursday from 8:00 am - 4:30 pm and Friday from 8:00 am-12:00 pm.  Patients with office visits requiring labs will be seen before walk-in labs.  You may encounter longer than normal wait times. Please allow additional time. Wait times may be shorter on  Monday and Thursday afternoons.  We do not book appointments for walk-in labs. We appreciate your patience and understanding with our staff.   Labs are drawn by [facility]. Please bring your co-pay at the time of your lab draw.  You may receive a bill from [facility] for your lab work.  Please note if you are on Hydroxychloroquine  and and an order has been placed for a Hydroxychloroquine  level,  you will need to have it drawn 4 hours or more after your last dose.  If you wish to have your labs drawn at another location, please call the office 24 hours in advance so we can fax the orders.  The office is located at [address], [address], [place], [place] [address]   If you have any questions regarding directions or hours of operation,  please call [phone].   As a reminder, please drink plenty of water prior to coming for your lab work. Thanks!   Exercises for Plantar Fasciitis Foot and leg exercises can help if you have plantar fasciitis. Only do the exercises you were told to do. Make sure you know how to do the exercises safely. Follow  the steps below. It's normal to feel mild discomfort. Stop if you feel pain or your pain gets worse. Do not start these exercises until told by your health care provider. Stretching and range-of-motion exercises These exercises warm up your muscles and joints. They also help with movement and flexibility of your foot. They can help with pain. Plantar fascia stretch This exercise will stretch your plantar fascia, which is a band of thick tissue on the bottom of your foot. Sit with your left / right leg crossed over your other knee. Hold your heel with one hand with that thumb near your arch. With your other hand, hold your toes. Gently pull your toes back toward the top of your foot. You should feel a stretch on the bottom of your toes, on the bottom of your foot, or both. Hold this stretch for __________ seconds. Slowly let go of your toes. Go back to the starting position. Repeat __________ times. Do this exercise __________ times a day. Gastroc stretch, standing This exercise is called an upper calf, or gastroc, stretch. It stretches the muscles in the back of your upper calf. Stand with your hands against a wall. Extend your left / right leg behind you. Bend your front knee just a little. Keep your heels on the floor, your toes facing forward, and your back knee straight. Shift your weight toward the wall. Do not arch your back. You  should feel a gentle stretch in your upper calf. Hold this position for __________ seconds. Repeat __________ times. Do this exercise __________ times a day. Soleus stretch, standing This exercise is called a lower calf, or soleus, stretch. It stretches the muscles in the back of your lower calf. Stand with your hands against a wall. Extend your left / right leg behind you, and bend your front knee slightly. Keep your heels on the floor and your toes facing forward. Bend your back knee and shift your weight slightly over your back leg. You should feel a gentle  stretch deep in your lower calf. Hold this position for __________ seconds. Repeat __________ times. Do this exercise __________ times a day. Gastroc and soleus stretch, standing step This exercise stretches the muscles in the back of your lower leg. This includes your gastroc and soleus muscles. Stand with the ball of your left / right foot on the front of a step. The ball of your foot is on the walking surface, right under your toes. Keep your other foot firmly on the same step. Hold on to the wall or a railing for balance. Slowly lift your other foot, letting your body weight press your heel down over the edge of the front of the step. Keep your knee straight and unbent. You should feel a stretch in your calf. Hold this position for __________ seconds. Return both feet to the step. Repeat this exercise with a slight bend in your left / right knee. Repeat __________ times with your left / right knee straight and __________ times with your left / right knee bent. Do this exercise __________ times a day. Balance exercise This exercise builds your balance and strength control of your arch. It helps take pressure off your plantar fascia. Single leg stand If this exercise is too easy, you can try it with your eyes closed or while standing on a pillow. Without shoes, stand near a railing or in a doorway. You may hold on to the railing or doorway as needed. Stand on your left / right foot. Keep your big toe down on the floor. Lift the arch of your foot. You should feel a stretch across the bottom of your foot and arch. Do not let your foot roll inward. Hold this position for __________ seconds. Repeat __________ times. Do this exercise __________ times a day. This information is not intended to replace advice given to you by your health care provider. Make sure you discuss any questions you have with your health care provider. Document Revised: 05/06/2023 Document Reviewed: 05/06/2023 [employer]vier  Patient Education  2024 [employer].

## 2024-12-03 LAB — PROTEIN / CREATININE RATIO, URINE
Creatinine, Urine: 62 mg/dL (ref 20–275)
Protein/Creat Ratio: 81 mg/g{creat} (ref 24–184)
Protein/Creatinine Ratio: 0.081 mg/mg{creat} (ref 0.024–0.184)
Total Protein, Urine: 5 mg/dL (ref 5–24)

## 2024-12-03 NOTE — Progress Notes (Signed)
 Urine protein creatinine ratio WNL

## 2024-12-07 ENCOUNTER — Other Ambulatory Visit: Payer: Self-pay

## 2024-12-07 MED ORDER — FOLIC ACID 1 MG PO TABS: 2.0000 mg | ORAL_TABLET | Freq: Every day | ORAL | 3 refills | Status: AC

## 2024-12-07 NOTE — Telephone Encounter (Signed)
 Refill request received via fax from [facility] for Folic Acid    Last Fill: 11/27/2023  Next Visit: 05/12/2025  Last Visit: 12/02/2024  Dx: Sjogren's syndrome with other organ involvement   Current Dose per office note on 12/02/2024: folic acid  2 mg daily.   Okay to refill Folic Acid ?

## 2024-12-08 ENCOUNTER — Other Ambulatory Visit: Payer: Self-pay | Admitting: Family Medicine

## 2024-12-08 DIAGNOSIS — F5101 Primary insomnia: Secondary | ICD-10-CM

## 2024-12-12 ENCOUNTER — Other Ambulatory Visit: Payer: Self-pay | Admitting: Family Medicine

## 2024-12-13 ENCOUNTER — Other Ambulatory Visit: Payer: Self-pay | Admitting: Family Medicine

## 2024-12-16 ENCOUNTER — Ambulatory Visit: Admitting: Physician Assistant

## 2025-01-12 ENCOUNTER — Ambulatory Visit

## 2025-01-14 ENCOUNTER — Ambulatory Visit

## 2025-04-20 ENCOUNTER — Ambulatory Visit: Admitting: Family Medicine

## 2025-05-12 ENCOUNTER — Ambulatory Visit: Payer: Self-pay | Admitting: Physician Assistant
